# Patient Record
Sex: Female | Born: 1985 | Race: White | Hispanic: No | Marital: Married | Smoking: Current every day smoker
Health system: Southern US, Community
[De-identification: ages and names within clinical notes are randomized; demographics above are authoritative.]

## PROBLEM LIST (undated history)

## (undated) DIAGNOSIS — K5909 Other constipation: Secondary | ICD-10-CM

## (undated) DIAGNOSIS — I499 Cardiac arrhythmia, unspecified: Secondary | ICD-10-CM

## (undated) DIAGNOSIS — M868X9 Other osteomyelitis, unspecified sites: Secondary | ICD-10-CM

## (undated) DIAGNOSIS — G8929 Other chronic pain: Secondary | ICD-10-CM

## (undated) DIAGNOSIS — K589 Irritable bowel syndrome without diarrhea: Secondary | ICD-10-CM

## (undated) DIAGNOSIS — M869 Osteomyelitis, unspecified: Secondary | ICD-10-CM

## (undated) DIAGNOSIS — G894 Chronic pain syndrome: Secondary | ICD-10-CM

## (undated) DIAGNOSIS — R102 Pelvic and perineal pain unspecified side: Secondary | ICD-10-CM

## (undated) HISTORY — PX: TUBAL LIGATION: SHX77

## (undated) HISTORY — PX: TONSILLECTOMY: SUR1361

## (undated) HISTORY — PX: PELVIC FRACTURE SURGERY: SHX119

## (undated) HISTORY — PX: OVARIAN CYST REMOVAL: SHX89

## (undated) HISTORY — PX: FRACTURE SURGERY: SHX138

---
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
## 2005-02-05 ENCOUNTER — Encounter (HOSPITAL_COMMUNITY): Admission: RE | Admit: 2005-02-05 | Discharge: 2005-03-07 | Payer: Self-pay

## 2005-02-17 ENCOUNTER — Emergency Department (HOSPITAL_COMMUNITY): Admission: EM | Admit: 2005-02-17 | Discharge: 2005-02-18 | Payer: Self-pay | Admitting: Emergency Medicine

## 2005-06-18 ENCOUNTER — Ambulatory Visit (HOSPITAL_COMMUNITY): Admission: RE | Admit: 2005-06-18 | Discharge: 2005-06-18 | Payer: Self-pay | Admitting: Family Medicine

## 2005-07-31 ENCOUNTER — Emergency Department (HOSPITAL_COMMUNITY): Admission: EM | Admit: 2005-07-31 | Discharge: 2005-07-31 | Payer: Self-pay | Admitting: Emergency Medicine

## 2005-10-29 ENCOUNTER — Emergency Department (HOSPITAL_COMMUNITY): Admission: EM | Admit: 2005-10-29 | Discharge: 2005-10-30 | Payer: Self-pay | Admitting: Emergency Medicine

## 2005-12-11 ENCOUNTER — Ambulatory Visit (HOSPITAL_BASED_OUTPATIENT_CLINIC_OR_DEPARTMENT_OTHER): Admission: RE | Admit: 2005-12-11 | Discharge: 2005-12-12 | Payer: Self-pay | Admitting: Otolaryngology

## 2005-12-11 ENCOUNTER — Encounter (INDEPENDENT_AMBULATORY_CARE_PROVIDER_SITE_OTHER): Payer: Self-pay | Admitting: *Deleted

## 2006-01-05 ENCOUNTER — Emergency Department (HOSPITAL_COMMUNITY): Admission: EM | Admit: 2006-01-05 | Discharge: 2006-01-05 | Payer: Self-pay | Admitting: Emergency Medicine

## 2007-02-15 ENCOUNTER — Emergency Department (HOSPITAL_COMMUNITY): Admission: EM | Admit: 2007-02-15 | Discharge: 2007-02-15 | Payer: Self-pay | Admitting: Family Medicine

## 2007-05-13 ENCOUNTER — Emergency Department (HOSPITAL_COMMUNITY): Admission: EM | Admit: 2007-05-13 | Discharge: 2007-05-14 | Payer: Self-pay | Admitting: Emergency Medicine

## 2007-08-23 ENCOUNTER — Ambulatory Visit (HOSPITAL_COMMUNITY): Admission: RE | Admit: 2007-08-23 | Discharge: 2007-08-23 | Payer: Self-pay | Admitting: Family Medicine

## 2007-08-31 ENCOUNTER — Emergency Department (HOSPITAL_COMMUNITY): Admission: EM | Admit: 2007-08-31 | Discharge: 2007-09-01 | Payer: Self-pay | Admitting: Emergency Medicine

## 2007-11-16 ENCOUNTER — Emergency Department (HOSPITAL_COMMUNITY): Admission: EM | Admit: 2007-11-16 | Discharge: 2007-11-16 | Payer: Self-pay | Admitting: Emergency Medicine

## 2008-03-24 ENCOUNTER — Emergency Department (HOSPITAL_COMMUNITY): Admission: EM | Admit: 2008-03-24 | Discharge: 2008-03-24 | Payer: Self-pay | Admitting: Emergency Medicine

## 2008-04-05 ENCOUNTER — Ambulatory Visit: Payer: Self-pay | Admitting: Internal Medicine

## 2008-04-05 DIAGNOSIS — M1990 Unspecified osteoarthritis, unspecified site: Secondary | ICD-10-CM | POA: Insufficient documentation

## 2008-04-05 DIAGNOSIS — M545 Low back pain, unspecified: Secondary | ICD-10-CM | POA: Insufficient documentation

## 2008-07-09 ENCOUNTER — Emergency Department (HOSPITAL_COMMUNITY): Admission: EM | Admit: 2008-07-09 | Discharge: 2008-07-10 | Payer: Self-pay | Admitting: Emergency Medicine

## 2008-08-07 ENCOUNTER — Emergency Department (HOSPITAL_COMMUNITY): Admission: EM | Admit: 2008-08-07 | Discharge: 2008-08-08 | Payer: Self-pay | Admitting: Emergency Medicine

## 2009-02-05 ENCOUNTER — Emergency Department (HOSPITAL_COMMUNITY): Admission: EM | Admit: 2009-02-05 | Discharge: 2009-02-05 | Payer: Self-pay | Admitting: Emergency Medicine

## 2009-09-20 ENCOUNTER — Emergency Department (HOSPITAL_COMMUNITY): Admission: EM | Admit: 2009-09-20 | Discharge: 2009-09-20 | Payer: Self-pay | Admitting: Emergency Medicine

## 2009-12-23 ENCOUNTER — Observation Stay (HOSPITAL_COMMUNITY): Admission: EM | Admit: 2009-12-23 | Discharge: 2009-12-23 | Payer: Self-pay | Admitting: Emergency Medicine

## 2010-04-19 ENCOUNTER — Emergency Department (HOSPITAL_COMMUNITY): Admission: EM | Admit: 2010-04-19 | Discharge: 2010-04-19 | Payer: Self-pay | Admitting: Emergency Medicine

## 2010-08-24 ENCOUNTER — Encounter: Payer: Self-pay | Admitting: Orthopedic Surgery

## 2010-11-10 LAB — POCT PREGNANCY, URINE: Preg Test, Ur: NEGATIVE

## 2010-12-03 ENCOUNTER — Emergency Department (HOSPITAL_COMMUNITY)
Admission: EM | Admit: 2010-12-03 | Discharge: 2010-12-03 | Disposition: A | Discharge status: Home / Self Care | Payer: Medicaid Other | Attending: Emergency Medicine | Admitting: Emergency Medicine

## 2010-12-03 ENCOUNTER — Emergency Department (HOSPITAL_COMMUNITY)
Admission: EM | Admit: 2010-12-03 | Discharge: 2010-12-03 | Disposition: A | Discharge status: Home / Self Care | Payer: Medicaid Other | Source: Home / Self Care | Attending: Emergency Medicine | Admitting: Emergency Medicine

## 2010-12-03 DIAGNOSIS — F988 Other specified behavioral and emotional disorders with onset usually occurring in childhood and adolescence: Secondary | ICD-10-CM | POA: Insufficient documentation

## 2010-12-03 DIAGNOSIS — N949 Unspecified condition associated with female genital organs and menstrual cycle: Secondary | ICD-10-CM | POA: Insufficient documentation

## 2010-12-03 DIAGNOSIS — G8929 Other chronic pain: Secondary | ICD-10-CM | POA: Insufficient documentation

## 2010-12-03 DIAGNOSIS — Z760 Encounter for issue of repeat prescription: Secondary | ICD-10-CM | POA: Insufficient documentation

## 2010-12-03 DIAGNOSIS — Z79899 Other long term (current) drug therapy: Secondary | ICD-10-CM | POA: Insufficient documentation

## 2010-12-20 NOTE — Op Note (Signed)
NAMEJAZARI, OBER              ACCOUNT NO.:  [id number]   MEDICAL RECORD NO.:  [id number]          PATIENT TYPE:  AMB   LOCATION:  DSC                          FACILITY:  [facility]   PHYSICIAN:  Suzanna Obey, M.D.       DATE OF BIRTH:  [birth year]   DATE OF PROCEDURE:  12/11/2005  DATE OF DISCHARGE:                                 OPERATIVE REPORT   PREOPERATIVE DIAGNOSIS:  Chronic tonsillitis.   POSTOPERATIVE DIAGNOSIS:  Chronic tonsillitis.   PROCEDURE:  Tonsillectomy.   ANESTHESIA:  General.   ESTIMATED BLOOD LOSS:  Less than 5 cc.   INDICATIONS FOR PROCEDURE:  This is a 25 year old who has had repetitive  tonsillitis episodes that have been refractory to medical therapy.  She is  at the point now that she is ready to proceed with the tonsillectomy.  She  was informed of the risks and benefits of the procedure, including bleeding,  infection, velopharyngeal insufficiency, change in the voice, chronic pain,  and risks of the anesthetic.  All questions were answered, and consent was  obtained.   DESCRIPTION OF PROCEDURE:  The patient was taken to the operating room and  placed in the supine position.  After adequate general endotracheal tube  anesthesia, she was placed in the Rose position and draped in the usual  sterile manner.  The Crowe-Davis mouthgag was inserted, retracted, and  suspended from the Mayo stand.   The left tonsil was begun by making a left anterior tonsillar pillar  incision, identifying the capsule of tonsil and removing it with  electrocautery dissection.  The right tonsil was removed in the same  fashion.  The Crowe-Davis was released __________ suspenders.  Hemostasis  present in all locations with the suction cautery.  The hypopharynx,  esophagus, and stomach were suctioned with the NG tube.   The patient was awakened and brought to the recovery room in stable  condition.  Counts were correct.           ______________________________  Suzanna Obey,  M.D.     JB/MEDQ  D:  12/11/2005  T:  12/12/2005  Job:  [id number]   cc:   Kirk Ruths, M.D.  Fax: [phone]
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
## 2011-01-10 ENCOUNTER — Emergency Department (HOSPITAL_COMMUNITY)
Admission: EM | Admit: 2011-01-10 | Discharge: 2011-01-10 | Disposition: A | Discharge status: Home / Self Care | Payer: Medicaid Other | Attending: Emergency Medicine | Admitting: Emergency Medicine

## 2011-01-10 DIAGNOSIS — R21 Rash and other nonspecific skin eruption: Secondary | ICD-10-CM | POA: Insufficient documentation

## 2011-01-10 LAB — DIFFERENTIAL
Basophils Absolute: 0 10*3/uL (ref 0.0–0.1)
Basophils Relative: 0 % (ref 0–1)
Eosinophils Absolute: 0.5 10*3/uL (ref 0.0–0.7)
Eosinophils Relative: 4 % (ref 0–5)
Lymphocytes Relative: 36 % (ref 12–46)
Lymphs Abs: 4.6 10*3/uL — ABNORMAL HIGH (ref 0.7–4.0)
Monocytes Absolute: 1 10*3/uL (ref 0.1–1.0)
Monocytes Relative: 8 % (ref 3–12)
Neutro Abs: 6.8 10*3/uL (ref 1.7–7.7)
Neutrophils Relative %: 53 % (ref 43–77)

## 2011-01-10 LAB — BASIC METABOLIC PANEL
BUN: 13 mg/dL (ref 6–23)
CO2: 29 mEq/L (ref 19–32)
Calcium: 9.7 mg/dL (ref 8.4–10.5)
Chloride: 100 mEq/L (ref 96–112)
Creatinine, Ser: 0.75 mg/dL (ref 0.4–1.2)
GFR calc Af Amer: >60 mL/min (ref >60)
GFR calc non Af Amer: >60 mL/min (ref >60)
Glucose, Bld: 98 mg/dL (ref 70–99)
Potassium: 3.7 mEq/L (ref 3.5–5.1)
Sodium: 135 mEq/L (ref 135–145)

## 2011-01-10 LAB — CBC
HCT: 34.4 % — ABNORMAL LOW (ref 36.0–46.0)
Hemoglobin: 11.7 g/dL — ABNORMAL LOW (ref 12.0–15.0)
MCH: 31.1 pg (ref 26.0–34.0)
Platelets: 220 10*3/uL (ref 150–400)
RBC: 3.76 MIL/uL — ABNORMAL LOW (ref 3.87–5.11)
RDW: 12.7 % (ref 11.5–15.5)
WBC: 12.9 10*3/uL — ABNORMAL HIGH (ref 4.0–10.5)

## 2011-01-11 LAB — SEDIMENTATION RATE: Sed Rate: 12 mm/hr (ref 0–22)

## 2012-01-28 ENCOUNTER — Encounter (HOSPITAL_COMMUNITY): Payer: Self-pay | Admitting: *Deleted

## 2012-01-28 ENCOUNTER — Emergency Department (HOSPITAL_COMMUNITY)
Admission: EM | Admit: 2012-01-28 | Discharge: 2012-01-28 | Disposition: A | Discharge status: Home / Self Care | Payer: Medicaid Other | Attending: Emergency Medicine | Admitting: Emergency Medicine

## 2012-01-28 DIAGNOSIS — L02211 Cutaneous abscess of abdominal wall: Secondary | ICD-10-CM

## 2012-01-28 DIAGNOSIS — Z880 Allergy status to penicillin: Secondary | ICD-10-CM | POA: Insufficient documentation

## 2012-01-28 DIAGNOSIS — L02219 Cutaneous abscess of trunk, unspecified: Secondary | ICD-10-CM | POA: Insufficient documentation

## 2012-01-28 DIAGNOSIS — F17200 Nicotine dependence, unspecified, uncomplicated: Secondary | ICD-10-CM | POA: Insufficient documentation

## 2012-01-28 HISTORY — DX: Cardiac arrhythmia, unspecified: I49.9

## 2012-01-28 MED ORDER — VANCOMYCIN HCL IN DEXTROSE 1-5 GM/200ML-% IV SOLN
1000.0000 mg | Freq: Once | INTRAVENOUS | Status: AC
  Administered 2012-01-28: 1000 mg via INTRAVENOUS
  Filled 2012-01-28: qty 200

## 2012-01-28 MED ORDER — ONDANSETRON HCL 4 MG/2ML IJ SOLN
4.0000 mg | Freq: Once | INTRAMUSCULAR | Status: AC
  Administered 2012-01-28: 4 mg via INTRAVENOUS
  Filled 2012-01-28: qty 2

## 2012-01-28 MED ORDER — DIPHENHYDRAMINE HCL 50 MG/ML IJ SOLN
25.0000 mg | Freq: Once | INTRAMUSCULAR | Status: AC
  Administered 2012-01-28: 25 mg via INTRAVENOUS
  Filled 2012-01-28: qty 1

## 2012-01-28 MED ORDER — SULFAMETHOXAZOLE-TRIMETHOPRIM 800-160 MG PO TABS: 1.0000 | ORAL_TABLET | Freq: Two times a day (BID) | ORAL | Status: AC

## 2012-01-28 MED ORDER — HYDROMORPHONE HCL PF 1 MG/ML IJ SOLN
1.0000 mg | Freq: Once | INTRAMUSCULAR | Status: AC
  Administered 2012-01-28: 1 mg via INTRAVENOUS
  Filled 2012-01-28: qty 1

## 2012-01-28 NOTE — Discharge Instructions (Signed)
Abscess An abscess (boil or furuncle) is an infected area under your skin. This area is filled with yellowish white fluid (pus). HOME CARE   Only take medicine as told by your doctor.   Keep the skin clean around your abscess. Keep clothes that may touch the abscess clean.   Change any bandages (dressings) as told by your doctor.   Avoid direct skin contact with other people. The infection can spread by skin contact with others.   Practice good hygiene and do not share personal care items.   Do not share athletic equipment, towels, or whirlpools. Shower after every practice or work out session.   If a draining area cannot be covered:   Do not play sports.   Children should not go to daycare until the wound has healed or until fluid (drainage) stops coming out of the wound.   See your doctor for a follow-up visit as told.  GET HELP RIGHT AWAY IF:   There is more pain, puffiness (swelling), and redness in the wound site.   There is fluid or bleeding from the wound site.   You have muscle aches, chills, fever, or feel sick.   You or your child has a temperature by mouth above 102 F (38.9 C), not controlled by medicine.   Your baby is older than 3 months with a rectal temperature of 102 F (38.9 C) or higher.  MAKE SURE YOU:   Understand these instructions.   Will watch your condition.   Will get help right away if you are not doing well or get worse.  Document Released: 01/07/2008 Document Revised: 07/10/2011 Document Reviewed: 01/07/2008 [facility] Patient Information 2012 [place], [place].  Keep wound clean. Antibiotic for 10 days. Phone number for general surgeon given. Leakage of pus is a good thing.
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
## 2012-01-28 NOTE — ED Notes (Signed)
Patient states "The benadryl has helped some, but not much."
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
## 2012-01-28 NOTE — ED Notes (Signed)
Patient complaining of generalized itching. Advised Dr Adriana Simas. Verbal order for Benadryl 25mg  IV once.

## 2012-03-07 ENCOUNTER — Emergency Department (HOSPITAL_COMMUNITY)
Admission: EM | Admit: 2012-03-07 | Discharge: 2012-03-07 | Disposition: A | Discharge status: Home / Self Care | Payer: Medicaid Other | Attending: Emergency Medicine | Admitting: Emergency Medicine

## 2012-03-07 ENCOUNTER — Encounter (HOSPITAL_COMMUNITY): Payer: Self-pay | Admitting: Emergency Medicine

## 2012-03-07 ENCOUNTER — Emergency Department (HOSPITAL_COMMUNITY): Payer: Medicaid Other

## 2012-03-07 DIAGNOSIS — S5002XA Contusion of left elbow, initial encounter: Secondary | ICD-10-CM

## 2012-03-07 DIAGNOSIS — S5000XA Contusion of unspecified elbow, initial encounter: Secondary | ICD-10-CM | POA: Insufficient documentation

## 2012-03-07 DIAGNOSIS — F17200 Nicotine dependence, unspecified, uncomplicated: Secondary | ICD-10-CM | POA: Insufficient documentation

## 2012-03-07 DIAGNOSIS — I499 Cardiac arrhythmia, unspecified: Secondary | ICD-10-CM | POA: Insufficient documentation

## 2012-03-07 DIAGNOSIS — Z79899 Other long term (current) drug therapy: Secondary | ICD-10-CM | POA: Insufficient documentation

## 2012-03-07 DIAGNOSIS — Z91013 Allergy to seafood: Secondary | ICD-10-CM | POA: Insufficient documentation

## 2012-03-07 DIAGNOSIS — Y9239 Other specified sports and athletic area as the place of occurrence of the external cause: Secondary | ICD-10-CM | POA: Insufficient documentation

## 2012-03-07 DIAGNOSIS — IMO0002 Reserved for concepts with insufficient information to code with codable children: Secondary | ICD-10-CM | POA: Insufficient documentation

## 2012-03-07 DIAGNOSIS — Z91038 Other insect allergy status: Secondary | ICD-10-CM | POA: Insufficient documentation

## 2012-03-07 DIAGNOSIS — Z880 Allergy status to penicillin: Secondary | ICD-10-CM | POA: Insufficient documentation

## 2012-03-07 MED ORDER — HYDROCODONE-ACETAMINOPHEN 5-325 MG PO TABS
1.0000 | ORAL_TABLET | Freq: Once | ORAL | Status: AC
  Administered 2012-03-07: 1 via ORAL
  Filled 2012-03-07: qty 1

## 2012-03-07 MED ORDER — HYDROCODONE-ACETAMINOPHEN 5-325 MG PO TABS: 1.0000 | ORAL_TABLET | ORAL | Status: AC | PRN

## 2012-03-07 MED ORDER — LIDOCAINE HCL (PF) 1 % IJ SOLN
INTRAMUSCULAR | Status: AC
  Filled 2012-03-07: qty 5

## 2012-03-07 NOTE — ED Notes (Signed)
Pt has swelling and pain to her left elbow since going down a water slide yesterday. Pt able to bend arm but unable to straighten it out. Pt alert and oriented x 3. Skin warm and dry. Color pink. Bruising noted to left elbow.

## 2012-03-07 NOTE — ED Provider Notes (Signed)
Medical screening examination/treatment/procedure(s) were performed by non-physician practitioner and as supervising physician I was immediately available for consultation/collaboration.   Lyanne Co, MD 03/07/12 2016
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
## 2012-03-07 NOTE — ED Notes (Signed)
Pt on slide and hit L elbow. States pain getting gworse and less mobile. Pt is guarding area. Slight swelling and bruising noted.

## 2013-04-04 ENCOUNTER — Emergency Department (HOSPITAL_COMMUNITY): Payer: Medicaid Other

## 2013-04-04 ENCOUNTER — Emergency Department (HOSPITAL_COMMUNITY)
Admission: EM | Admit: 2013-04-04 | Discharge: 2013-04-04 | Disposition: A | Discharge status: Home / Self Care | Payer: Medicaid Other | Attending: Emergency Medicine | Admitting: Emergency Medicine

## 2013-04-04 DIAGNOSIS — F17200 Nicotine dependence, unspecified, uncomplicated: Secondary | ICD-10-CM | POA: Insufficient documentation

## 2013-04-04 DIAGNOSIS — Z880 Allergy status to penicillin: Secondary | ICD-10-CM | POA: Insufficient documentation

## 2013-04-04 DIAGNOSIS — Y929 Unspecified place or not applicable: Secondary | ICD-10-CM | POA: Insufficient documentation

## 2013-04-04 DIAGNOSIS — Z881 Allergy status to other antibiotic agents status: Secondary | ICD-10-CM | POA: Insufficient documentation

## 2013-04-04 DIAGNOSIS — I499 Cardiac arrhythmia, unspecified: Secondary | ICD-10-CM | POA: Insufficient documentation

## 2013-04-04 DIAGNOSIS — M25522 Pain in left elbow: Secondary | ICD-10-CM

## 2013-04-04 DIAGNOSIS — IMO0002 Reserved for concepts with insufficient information to code with codable children: Secondary | ICD-10-CM | POA: Insufficient documentation

## 2013-04-04 DIAGNOSIS — M25529 Pain in unspecified elbow: Secondary | ICD-10-CM | POA: Insufficient documentation

## 2013-04-04 DIAGNOSIS — Z79899 Other long term (current) drug therapy: Secondary | ICD-10-CM | POA: Insufficient documentation

## 2013-04-04 DIAGNOSIS — Y939 Activity, unspecified: Secondary | ICD-10-CM | POA: Insufficient documentation

## 2013-04-04 MED ORDER — OXYCODONE-ACETAMINOPHEN 5-325 MG PO TABS
1.0000 | ORAL_TABLET | Freq: Once | ORAL | Status: AC
  Administered 2013-04-04: 1 via ORAL
  Filled 2013-04-04 (×2): qty 1

## 2013-04-04 MED ORDER — IBUPROFEN 200 MG PO TABS
400.0000 mg | ORAL_TABLET | Freq: Once | ORAL | Status: AC
  Administered 2013-04-04: 400 mg via ORAL
  Filled 2013-04-04: qty 2
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
## 2013-04-04 NOTE — ED Provider Notes (Signed)
Medical screening examination/treatment/procedure(s) were performed by non-physician practitioner and as supervising physician I was immediately available for consultation/collaboration. Jarielys Girardot, MD, FACEP   Jalecia Leon L Abhinav Mayorquin, MD 04/04/13 2301 

## 2013-10-07 ENCOUNTER — Encounter (HOSPITAL_COMMUNITY): Payer: Self-pay | Admitting: Emergency Medicine

## 2013-10-07 DIAGNOSIS — Z79899 Other long term (current) drug therapy: Secondary | ICD-10-CM | POA: Insufficient documentation

## 2013-10-07 DIAGNOSIS — N83209 Unspecified ovarian cyst, unspecified side: Secondary | ICD-10-CM | POA: Insufficient documentation

## 2013-10-07 DIAGNOSIS — F17200 Nicotine dependence, unspecified, uncomplicated: Secondary | ICD-10-CM | POA: Insufficient documentation

## 2013-10-07 DIAGNOSIS — Z791 Long term (current) use of non-steroidal anti-inflammatories (NSAID): Secondary | ICD-10-CM | POA: Insufficient documentation

## 2013-10-07 DIAGNOSIS — Z880 Allergy status to penicillin: Secondary | ICD-10-CM | POA: Insufficient documentation

## 2013-10-07 DIAGNOSIS — Z8679 Personal history of other diseases of the circulatory system: Secondary | ICD-10-CM | POA: Insufficient documentation

## 2013-10-07 DIAGNOSIS — K8020 Calculus of gallbladder without cholecystitis without obstruction: Secondary | ICD-10-CM | POA: Insufficient documentation

## 2013-10-07 DIAGNOSIS — Z3202 Encounter for pregnancy test, result negative: Secondary | ICD-10-CM | POA: Insufficient documentation

## 2013-10-07 NOTE — ED Notes (Signed)
Patient c/o LLQ pain since 0100 with nausea.

## 2013-10-08 ENCOUNTER — Emergency Department (HOSPITAL_COMMUNITY): Payer: Medicaid Other

## 2013-10-08 ENCOUNTER — Emergency Department (HOSPITAL_COMMUNITY)
Admission: EM | Admit: 2013-10-08 | Discharge: 2013-10-08 | Disposition: A | Discharge status: Home / Self Care | Payer: Medicaid Other | Attending: Emergency Medicine | Admitting: Emergency Medicine

## 2013-10-08 DIAGNOSIS — N83209 Unspecified ovarian cyst, unspecified side: Secondary | ICD-10-CM

## 2013-10-08 DIAGNOSIS — K8020 Calculus of gallbladder without cholecystitis without obstruction: Secondary | ICD-10-CM

## 2013-10-08 LAB — BASIC METABOLIC PANEL
BUN: 22 mg/dL (ref 6–23)
CALCIUM: 9.1 mg/dL (ref 8.4–10.5)
CO2: 25 meq/L (ref 19–32)
Chloride: 103 mEq/L (ref 96–112)
Creatinine, Ser: 0.66 mg/dL (ref 0.50–1.10)
GFR calc Af Amer: >90 mL/min (ref >90)
Glucose, Bld: 77 mg/dL (ref 70–99)
POTASSIUM: 3.6 meq/L — AB (ref 3.7–5.3)
SODIUM: 139 meq/L (ref 137–147)

## 2013-10-08 LAB — CBC WITH DIFFERENTIAL/PLATELET
Basophils Absolute: 0 10*3/uL (ref 0.0–0.1)
Basophils Relative: 0 % (ref 0–1)
Eosinophils Absolute: 0.1 10*3/uL (ref 0.0–0.7)
Eosinophils Relative: 0 % (ref 0–5)
HCT: 35.5 % — ABNORMAL LOW (ref 36.0–46.0)
Hemoglobin: 12.1 g/dL (ref 12.0–15.0)
LYMPHS ABS: 3.3 10*3/uL (ref 0.7–4.0)
LYMPHS PCT: 24 % (ref 12–46)
MCH: 31.4 pg (ref 26.0–34.0)
MCHC: 34.1 g/dL (ref 30.0–36.0)
MCV: 92.2 fL (ref 78.0–100.0)
Monocytes Absolute: 0.8 10*3/uL (ref 0.1–1.0)
Monocytes Relative: 5 % (ref 3–12)
NEUTROS PCT: 71 % (ref 43–77)
Neutro Abs: 9.9 10*3/uL — ABNORMAL HIGH (ref 1.7–7.7)
PLATELETS: 256 10*3/uL (ref 150–400)
RBC: 3.85 MIL/uL — AB (ref 3.87–5.11)
RDW: 13.2 % (ref 11.5–15.5)
WBC: 14.1 10*3/uL — AB (ref 4.0–10.5)

## 2013-10-08 LAB — URINALYSIS, ROUTINE W REFLEX MICROSCOPIC
Bilirubin Urine: NEGATIVE
GLUCOSE, UA: NEGATIVE mg/dL
Hgb urine dipstick: NEGATIVE
Ketones, ur: NEGATIVE mg/dL
LEUKOCYTES UA: NEGATIVE
Nitrite: NEGATIVE
PROTEIN: NEGATIVE mg/dL
Urobilinogen, UA: 0.2 mg/dL (ref 0.0–1.0)
pH: 5.5 (ref 5.0–8.0)

## 2013-10-08 LAB — PREGNANCY, URINE: Preg Test, Ur: NEGATIVE

## 2013-10-08 MED ORDER — IOHEXOL 300 MG/ML  SOLN
50.0000 mL | Freq: Once | INTRAMUSCULAR | Status: AC | PRN
  Administered 2013-10-08: 50 mL via ORAL

## 2013-10-08 MED ORDER — MORPHINE SULFATE 2 MG/ML IJ SOLN
INTRAMUSCULAR | Status: AC
  Administered 2013-10-08: 4 mg via INTRAVENOUS
  Filled 2013-10-08: qty 2

## 2013-10-08 MED ORDER — MORPHINE SULFATE 4 MG/ML IJ SOLN: 4.0000 mg | Freq: Once | INTRAMUSCULAR | Status: DC

## 2013-10-08 MED ORDER — NAPROXEN 500 MG PO TABS: 500.0000 mg | ORAL_TABLET | Freq: Two times a day (BID) | ORAL | Status: DC

## 2013-10-08 MED ORDER — OXYCODONE-ACETAMINOPHEN 5-325 MG PO TABS
2.0000 | ORAL_TABLET | Freq: Once | ORAL | Status: AC
  Administered 2013-10-08: 2 via ORAL
  Filled 2013-10-08: qty 2

## 2013-10-08 MED ORDER — ONDANSETRON 4 MG PO TBDP: 4.0000 mg | ORAL_TABLET | Freq: Three times a day (TID) | ORAL | Status: DC | PRN

## 2013-10-08 MED ORDER — HYDROCODONE-ACETAMINOPHEN 5-325 MG PO TABS: 2.0000 | ORAL_TABLET | ORAL | Status: DC | PRN

## 2013-10-08 MED ORDER — IOHEXOL 300 MG/ML  SOLN
100.0000 mL | Freq: Once | INTRAMUSCULAR | Status: AC | PRN
  Administered 2013-10-08: 100 mL via INTRAVENOUS

## 2013-10-08 MED ORDER — ONDANSETRON HCL 4 MG/2ML IJ SOLN
4.0000 mg | Freq: Once | INTRAMUSCULAR | Status: AC
  Administered 2013-10-08: 4 mg via INTRAVENOUS
  Filled 2013-10-08: qty 2

## 2013-10-08 MED ORDER — SODIUM CHLORIDE 0.9 % IV SOLN
Freq: Once | INTRAVENOUS | Status: AC
  Administered 2013-10-08: 75 mL/h via INTRAVENOUS

## 2013-10-08 NOTE — ED Notes (Signed)
Pt ambulatory to the bathroom with assistance.

## 2013-10-08 NOTE — ED Provider Notes (Signed)
CSN: [id number]     Arrival date & time 10/07/13  2325 History   First MD Initiated Contact with Patient 10/08/13 [phone]     Chief Complaint  Patient presents with  . Abdominal Pain     (Consider location/radiation/quality/duration/timing/severity/associated sxs/prior Treatment) HPI Comments: 28 year old female with no past surgical history of the abdomen who presents with a complaint of left lower quadrant pain which was gradual in onset yesterday, has been persistent and has been gradually worsening over the last 12 hours. The pain is localized in the left pelvis in the suprapubic region but there is no complaints of urinary problems including dysuria, hematuria, frequency, diarrhea, rectal bleeding or constipation. She had some nausea but took Phenergan prior to arrival. This pain is moderate to severe, not similar to any prior pain she has had in the past. She has never had a kidney stone, never had an ovarian pathology and has never had appendicitis or diverticulitis. This pain does not radiate to the back. She denies vaginal discharge or bleeding  Patient is a 28 y.o. female presenting with abdominal pain. The history is provided by the patient and the spouse.  Abdominal Pain   Past Medical History  Diagnosis Date  . Arrhythmia    Past Surgical History  Procedure Laterality Date  . Cesarean section    . Pelvic fracture surgery    . Tonsillectomy     No family history on file. History  Substance Use Topics  . Smoking status: Current Every Day Smoker  . Smokeless tobacco: Not on file  . Alcohol Use: No   OB History   Grav Para Term Preterm Abortions TAB SAB Ect Mult Living                 Review of Systems  Gastrointestinal: Positive for abdominal pain.  All other systems reviewed and are negative.      Allergies  Bee venom; Shellfish allergy; Other; Amoxicillin; and Penicillins  Home Medications   Current Outpatient Rx  Name  Route  Sig  Dispense  Refill  .  Buprenorphine HCl-Naloxone HCl (SUBOXONE) 8-2 MG FILM   Sublingual   Place 1 each under the tongue 2 (two) times daily.         . diclofenac sodium (VOLTAREN) 1 % GEL   Topical   Apply 1 application topically daily as needed. For pain         . EPINEPHrine (EPIPEN) 0.3 mg/0.3 mL DEVI   Intramuscular   Inject 0.3 mg into the muscle once.         Marland Kitchen ibuprofen (ADVIL,MOTRIN) 200 MG tablet   Oral   Take 400 mg by mouth every 6 (six) hours as needed for pain.         Marland Kitchen zolpidem (AMBIEN) 10 MG tablet   Oral   Take 10 mg by mouth at bedtime.         Marland Kitchen HYDROcodone-acetaminophen (NORCO/VICODIN) 5-325 MG per tablet   Oral   Take 2 tablets by mouth every 4 (four) hours as needed.   10 tablet   0   . naproxen (NAPROSYN) 500 MG tablet   Oral   Take 1 tablet (500 mg total) by mouth 2 (two) times daily with a meal.   30 tablet   0   . ondansetron (ZOFRAN ODT) 4 MG disintegrating tablet   Oral   Take 1 tablet (4 mg total) by mouth every 8 (eight) hours as needed for nausea.   10  tablet   0    BP 111/68  Pulse 68  Temp(Src) 98.2 F (36.8 C) (Oral)  Resp 20  Ht 5\' 5"  (1.651 m)  Wt 135 lb (61.236 kg)  BMI 22.47 kg/m2  SpO2 100%  LMP 09/12/2013 Physical Exam  Nursing note and vitals reviewed. Constitutional: She appears well-developed and well-nourished. No distress.  HENT:  Head: Normocephalic and atraumatic.  Mouth/Throat: Oropharynx is clear and moist. No oropharyngeal exudate.  Eyes: Conjunctivae and EOM are normal. Pupils are equal, round, and reactive to light. Right eye exhibits no discharge. Left eye exhibits no discharge. No scleral icterus.  Neck: Normal range of motion. Neck supple. No JVD present. No thyromegaly present.  Cardiovascular: Normal rate, regular rhythm, normal heart sounds and intact distal pulses.  Exam reveals no gallop and no friction rub.   No murmur heard. Pulmonary/Chest: Effort normal and breath sounds normal. No respiratory distress.  She has no wheezes. She has no rales.  Abdominal: Soft. Bowel sounds are normal. She exhibits no distension and no mass. There is tenderness ( Focal tenderness to palpation in the left lower quadrant and suprapubic area. No other abdominal tenderness).  No guarding, no peritoneal signs  Musculoskeletal: Normal range of motion. She exhibits no edema and no tenderness.  Lymphadenopathy:    She has no cervical adenopathy.  Neurological: She is alert. Coordination normal.  Skin: Skin is warm and dry. No rash noted. No erythema.  Psychiatric: She has a normal mood and affect. Her behavior is normal.    ED Course  Procedures (including critical care time) Labs Review Labs Reviewed  URINALYSIS, ROUTINE W REFLEX MICROSCOPIC - Abnormal; Notable for the following:    Specific Gravity, Urine >1.030 (*)    All other components within normal limits  BASIC METABOLIC PANEL - Abnormal; Notable for the following:    Potassium 3.6 (*)    All other components within normal limits  CBC WITH DIFFERENTIAL - Abnormal; Notable for the following:    WBC 14.1 (*)    RBC 3.85 (*)    HCT 35.5 (*)    Neutro Abs 9.9 (*)    All other components within normal limits  PREGNANCY, URINE   Imaging Review Ct Abdomen Pelvis W Contrast  10/08/2013   CLINICAL DATA:  Left lower quadrant pain.  EXAM: CT ABDOMEN AND PELVIS WITH CONTRAST  TECHNIQUE: Multidetector CT imaging of the abdomen and pelvis was performed using the standard protocol following bolus administration of intravenous contrast.  CONTRAST:  49mL OMNIPAQUE IOHEXOL 300 MG/ML SOLN, 122mL OMNIPAQUE IOHEXOL 300 MG/ML SOLN  COMPARISON:  None.  FINDINGS: BODY WALL: Unremarkable.  LOWER CHEST: Unremarkable.  ABDOMEN/PELVIS:  Liver: No focal abnormality.  Biliary: Numerous gallstones. No biliary dilatation. No evidence of cholecystitis.  Pancreas: Unremarkable.  Spleen: Unremarkable.  Adrenals: Unremarkable.  Kidneys and ureters: No hydronephrosis or stone.  Bladder:  Unremarkable.  Reproductive: 7 cm left ovarian cyst. No evidence of nodule or septation. The neighboring ovarian parenchyma does not appear particularly edematous or enlarged to suggest active torsion.  Bowel: No obstruction. Appendix not visualized. No pericecal inflammation.  Retroperitoneum: No mass or adenopathy.  Peritoneum: No free fluid or gas.  Vascular: No acute abnormality.  OSSEOUS: Status post ORIF of a right pelvic fracture. There is symmetric sclerosis around remote hardware sites, without active inflammation visible. The sclerosis chronic compared to lumbar spine radiography in 2006.  IMPRESSION: 1. 7 cm left ovarian cyst which appears simple. If clinically needed, Doppler could document left ovarian  flow. See recommendations below. 2. Cholelithiasis.  RECOMMENDATIONS: The ovarian cyst is almost certainly benign, but follow up ultrasound is recommended in 1 year according to the Society of Radiologists in [place] (D Clovis Riley et al. Management of Asymptomatic Ovarian and Other Adnexal Cysts Imaged at [place]: Society of Radiologists in [place] Statement 2010. Radiology 256 (Sept 2010): [id number].).   Electronically Signed   By: Jorje Guild M.D.   On: 10/08/2013 06:05     EKG Interpretation None      MDM   Final diagnoses:  Ovarian cyst  Cholelithiasis    The patient has no CVA tenderness, normal vital signs but does have a leukocytosis. At this time I feel it would be pertinent to test the patient for other etiologies of her pain such as diverticulitis, ovarian pathology. I do not feel that her pain is consistent with ovarian torsion. CT scan will be ordered, pain medication nausea medication and fluids ordered.  The ovarian cyst is 7cm in size - there is no other acute findings - pt informed of the gall stones and the cyst - ahs f/u with GYN in 2 days.  Feeling better after meds  Meds given in ED:  Medications  morphine 4  MG/ML injection 4 mg (not administered)  oxyCODONE-acetaminophen (PERCOCET/ROXICET) 5-325 MG per tablet 2 tablet (not administered)  ondansetron (ZOFRAN) injection 4 mg (not administered)  ondansetron (ZOFRAN) injection 4 mg (4 mg Intravenous Given 10/08/13 0335)  0.9 %  sodium chloride infusion (75 mL/hr Intravenous New Bag/Given 10/08/13 0334)  morphine 2 MG/ML injection (4 mg Intravenous Given 10/08/13 0337)  iohexol (OMNIPAQUE) 300 MG/ML solution 50 mL (50 mLs Oral Contrast Given 10/08/13 0451)  iohexol (OMNIPAQUE) 300 MG/ML solution 100 mL (100 mLs Intravenous Contrast Given 10/08/13 0451)    New Prescriptions   HYDROCODONE-ACETAMINOPHEN (NORCO/VICODIN) 5-325 MG PER TABLET    Take 2 tablets by mouth every 4 (four) hours as needed.   NAPROXEN (NAPROSYN) 500 MG TABLET    Take 1 tablet (500 mg total) by mouth 2 (two) times daily with a meal.   ONDANSETRON (ZOFRAN ODT) 4 MG DISINTEGRATING TABLET    Take 1 tablet (4 mg total) by mouth every 8 (eight) hours as needed for nausea.      Johnna Acosta, MD 10/08/13 [phone]

## 2013-10-08 NOTE — Discharge Instructions (Signed)
Please call your doctor for a followup appointment within 24-48 hours. When you talk to your doctor please let them know that you were seen in the emergency department and have them acquire all of your records so that they can discuss the findings with you and formulate a treatment plan to fully care for your new and ongoing problems. ° °

## 2013-10-08 NOTE — ED Notes (Signed)
Discharge instructions and prescriptions given and reviewed with patient.  Patient verbalized understanding of sedating effects of medication and follow up care with GYN on Monday.  Patient ambulatory; discharged home in good condition.

## 2013-10-10 ENCOUNTER — Encounter: Payer: Self-pay | Admitting: Women's Health

## 2013-10-10 ENCOUNTER — Ambulatory Visit (INDEPENDENT_AMBULATORY_CARE_PROVIDER_SITE_OTHER): Payer: Medicaid Other | Admitting: Women's Health

## 2013-10-10 ENCOUNTER — Ambulatory Visit (INDEPENDENT_AMBULATORY_CARE_PROVIDER_SITE_OTHER): Payer: Medicaid Other

## 2013-10-10 ENCOUNTER — Other Ambulatory Visit: Payer: Self-pay | Admitting: Obstetrics and Gynecology

## 2013-10-10 ENCOUNTER — Other Ambulatory Visit (HOSPITAL_COMMUNITY)
Admission: RE | Admit: 2013-10-10 | Discharge: 2013-10-10 | Disposition: A | Discharge status: Home / Self Care | Payer: Medicaid Other | Source: Ambulatory Visit | Attending: Obstetrics & Gynecology | Admitting: Obstetrics & Gynecology

## 2013-10-10 ENCOUNTER — Telehealth: Payer: Self-pay | Admitting: Obstetrics and Gynecology

## 2013-10-10 VITALS — BP 120/60 | Ht 66.0 in | Wt 142.0 lb

## 2013-10-10 DIAGNOSIS — Z0000 Encounter for general adult medical examination without abnormal findings: Secondary | ICD-10-CM

## 2013-10-10 DIAGNOSIS — Z01419 Encounter for gynecological examination (general) (routine) without abnormal findings: Secondary | ICD-10-CM

## 2013-10-10 DIAGNOSIS — N83209 Unspecified ovarian cyst, unspecified side: Secondary | ICD-10-CM

## 2013-10-10 DIAGNOSIS — Z8781 Personal history of (healed) traumatic fracture: Secondary | ICD-10-CM | POA: Insufficient documentation

## 2013-10-10 DIAGNOSIS — R102 Pelvic and perineal pain: Secondary | ICD-10-CM

## 2013-10-10 DIAGNOSIS — Z01818 Encounter for other preprocedural examination: Secondary | ICD-10-CM

## 2013-10-10 DIAGNOSIS — N83202 Unspecified ovarian cyst, left side: Secondary | ICD-10-CM

## 2013-10-10 DIAGNOSIS — F17200 Nicotine dependence, unspecified, uncomplicated: Secondary | ICD-10-CM | POA: Insufficient documentation

## 2013-10-10 MED ORDER — ONDANSETRON 8 MG PO TBDP: 8.0000 mg | ORAL_TABLET | Freq: Three times a day (TID) | ORAL | Status: DC | PRN

## 2013-10-10 MED ORDER — OXYCODONE-ACETAMINOPHEN 5-325 MG PO TABS: 1.0000 | ORAL_TABLET | ORAL | Status: DC | PRN

## 2013-10-10 MED ORDER — KETOROLAC TROMETHAMINE 10 MG PO TABS: 10.0000 mg | ORAL_TABLET | Freq: Four times a day (QID) | ORAL | Status: DC | PRN

## 2013-10-10 NOTE — Telephone Encounter (Signed)
Pt would like to proceed with surgery tomorrow with Dr. Glo Herring, would also like to get tubal ligation. Spoke with Dr. Glo Herring, will review schedule and see if possible to do procedure tomorrow and call pt back.
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
## 2013-10-10 NOTE — Patient Instructions (Addendum)
Ovarian Cyst An ovarian cyst is a fluid-filled sac that forms on an ovary. The ovaries are small organs that produce eggs in women. Various types of cysts can form on the ovaries. Most are not cancerous. Many do not cause problems, and they often go away on their own. Some may cause symptoms and require treatment. Common types of ovarian cysts include:  Functional cysts These cysts may occur every month during the menstrual cycle. This is normal. The cysts usually go away with the next menstrual cycle if the woman does not get pregnant. Usually, there are no symptoms with a functional cyst.  Endometrioma cysts These cysts form from the tissue that lines the uterus. They are also called "chocolate cysts" because they become filled with blood that turns brown. This type of cyst can cause pain in the lower abdomen during intercourse and with your menstrual period.  Cystadenoma cysts This type develops from the cells on the outside of the ovary. These cysts can get very big and cause lower abdomen pain and pain with intercourse. This type of cyst can twist on itself, cut off its blood supply, and cause severe pain. It can also easily rupture and cause a lot of pain.  Dermoid cysts This type of cyst is sometimes found in both ovaries. These cysts may contain different kinds of body tissue, such as skin, teeth, hair, or cartilage. They usually do not cause symptoms unless they get very big.  Theca lutein cysts These cysts occur when too much of a certain hormone (human chorionic gonadotropin) is produced and overstimulates the ovaries to produce an egg. This is most common after procedures used to assist with the conception of a baby (in vitro fertilization). CAUSES   Fertility drugs can cause a condition in which multiple large cysts are formed on the ovaries. This is called ovarian hyperstimulation syndrome.  A condition called polycystic ovary syndrome can cause hormonal imbalances that can lead to  nonfunctional ovarian cysts. SIGNS AND SYMPTOMS  Many ovarian cysts do not cause symptoms. If symptoms are present, they may include:  Pelvic pain or pressure.  Pain in the lower abdomen.  Pain during sexual intercourse.  Increasing girth (swelling) of the abdomen.  Abnormal menstrual periods.  Increasing pain with menstrual periods.  Stopping having menstrual periods without being pregnant. DIAGNOSIS  These cysts are commonly found during a routine or annual pelvic exam. Tests may be ordered to find out more about the cyst. These tests may include:  Ultrasound.  X-ray of the pelvis.  CT scan.  MRI.  Blood tests. TREATMENT  Many ovarian cysts go away on their own without treatment. Your health care provider may want to check your cyst regularly for 2 3 months to see if it changes. For women in menopause, it is particularly important to monitor a cyst closely because of the higher rate of ovarian cancer in menopausal women. When treatment is needed, it may include any of the following:  A procedure to drain the cyst (aspiration). This may be done using a long needle and ultrasound. It can also be done through a laparoscopic procedure. This involves using a thin, lighted tube with a tiny camera on the end (laparoscope) inserted through a small incision.  Surgery to remove the whole cyst. This may be done using laparoscopic surgery or an open surgery involving a larger incision in the lower abdomen.  Hormone treatment or birth control pills. These methods are sometimes used to help dissolve a cyst. HOME CARE  INSTRUCTIONS   Only take over-the-counter or prescription medicines as directed by your health care provider.  Follow up with your health care provider as directed.  Get regular pelvic exams and Pap tests. SEEK MEDICAL CARE IF:   Your periods are late, irregular, or painful, or they stop.  Your pelvic pain or abdominal pain does not go away.  Your abdomen becomes  larger or swollen.  You have pressure on your bladder or trouble emptying your bladder completely.  You have pain during sexual intercourse.  You have feelings of fullness, pressure, or discomfort in your stomach.  You lose weight for no apparent reason.  You feel generally ill.  You become constipated.  You lose your appetite.  You develop acne.  You have an increase in body and facial hair.  You are gaining weight, without changing your exercise and eating habits.  You think you are pregnant. SEEK IMMEDIATE MEDICAL CARE IF:   You have increasing abdominal pain.  You feel sick to your stomach (nauseous), and you throw up (vomit).  You develop a fever that comes on suddenly.  You have abdominal pain during a bowel movement.  Your menstrual periods become heavier than usual. Document Released: 07/21/2005 Document Revised: 05/11/2013 Document Reviewed: 03/28/2013 [facility] Patient Information 2014 [place].  Etonogestrel implant- Nexplanon What is this medicine? ETONOGESTREL (et oh noe JES trel) is a contraceptive (birth control) device. It is used to prevent pregnancy. It can be used for up to 3 years. This medicine may be used for other purposes; ask your health care provider or pharmacist if you have questions. COMMON BRAND NAME(S): Implanon, Nexplanon  What should I tell my health care provider before I take this medicine? They need to know if you have any of these conditions: -abnormal vaginal bleeding -blood vessel disease or blood clots -cancer of the breast, cervix, or liver -depression -diabetes -gallbladder disease -headaches -heart disease or recent heart attack -high blood pressure -high cholesterol -kidney disease -liver disease -renal disease -seizures -tobacco smoker -an unusual or allergic reaction to etonogestrel, other hormones, anesthetics or antiseptics, medicines, foods, dyes, or preservatives -pregnant or trying to get  pregnant -breast-feeding How should I use this medicine? This device is inserted just under the skin on the inner side of your upper arm by a health care professional. Talk to your pediatrician regarding the use of this medicine in children. Special care may be needed. Overdosage: If you think you've taken too much of this medicine contact a poison control center or emergency room at once. Overdosage: If you think you have taken too much of this medicine contact a poison control center or emergency room at once. NOTE: This medicine is only for you. Do not share this medicine with others. What if I miss a dose? This does not apply. What may interact with this medicine? Do not take this medicine with any of the following medications: -amprenavir -bosentan -fosamprenavir This medicine may also interact with the following medications: -barbiturate medicines for inducing sleep or treating seizures -certain medicines for fungal infections like ketoconazole and itraconazole -griseofulvin -medicines to treat seizures like carbamazepine, felbamate, oxcarbazepine, phenytoin, topiramate -modafinil -phenylbutazone -rifampin -some medicines to treat HIV infection like atazanavir, indinavir, lopinavir, nelfinavir, tipranavir, ritonavir -St. John's wort This list may not describe all possible interactions. Give your health care provider a list of all the medicines, herbs, non-prescription drugs, or dietary supplements you use. Also tell them if you smoke, drink alcohol, or use illegal drugs. Some items may  interact with your medicine. What should I watch for while using this medicine? This product does not protect you against HIV infection (AIDS) or other sexually transmitted diseases. You should be able to feel the implant by pressing your fingertips over the skin where it was inserted. Tell your doctor if you cannot feel the implant. What side effects may I notice from receiving this medicine? Side  effects that you should report to your doctor or health care professional as soon as possible: -allergic reactions like skin rash, itching or hives, swelling of the face, lips, or tongue -breast lumps -changes in vision -confusion, trouble speaking or understanding -dark urine -depressed mood -general ill feeling or flu-like symptoms -light-colored stools -loss of appetite, nausea -right upper belly pain -severe headaches -severe pain, swelling, or tenderness in the abdomen -shortness of breath, chest pain, swelling in a leg -signs of pregnancy -sudden numbness or weakness of the face, arm or leg -trouble walking, dizziness, loss of balance or coordination -unusual vaginal bleeding, discharge -unusually weak or tired -yellowing of the eyes or skin Side effects that usually do not require medical attention (Report these to your doctor or health care professional if they continue or are bothersome.): -acne -breast pain -changes in weight -cough -fever or chills -headache -irregular menstrual bleeding -itching, burning, and vaginal discharge -pain or difficulty passing urine -sore throat This list may not describe all possible side effects. Call your doctor for medical advice about side effects. You may report side effects to FDA at 1-800-FDA-1088. Where should I keep my medicine? This drug is given in a hospital or clinic and will not be stored at home. NOTE: This sheet is a summary. It may not cover all possible information. If you have questions about this medicine, talk to your doctor, pharmacist, or health care provider.  2014, [employer]vier/[employer] Standard. (2012-01-26 15:37:45)

## 2013-10-11 ENCOUNTER — Ambulatory Visit (HOSPITAL_COMMUNITY)
Admission: RE | Admit: 2013-10-11 | Discharge: 2013-10-11 | Disposition: A | Discharge status: Home / Self Care | Payer: Medicaid Other | Source: Ambulatory Visit | Attending: Obstetrics and Gynecology | Admitting: Obstetrics and Gynecology

## 2013-10-11 ENCOUNTER — Encounter (HOSPITAL_COMMUNITY): Payer: Self-pay

## 2013-10-11 ENCOUNTER — Ambulatory Visit (HOSPITAL_COMMUNITY): Payer: Medicaid Other | Admitting: Anesthesiology

## 2013-10-11 ENCOUNTER — Encounter (HOSPITAL_COMMUNITY)
Admission: RE | Disposition: A | Discharge status: Home / Self Care | Payer: Self-pay | Source: Ambulatory Visit | Attending: Obstetrics and Gynecology

## 2013-10-11 ENCOUNTER — Encounter (HOSPITAL_COMMUNITY): Payer: Medicaid Other | Admitting: Anesthesiology

## 2013-10-11 DIAGNOSIS — N83209 Unspecified ovarian cyst, unspecified side: Secondary | ICD-10-CM | POA: Insufficient documentation

## 2013-10-11 DIAGNOSIS — Z302 Encounter for sterilization: Secondary | ICD-10-CM | POA: Insufficient documentation

## 2013-10-11 DIAGNOSIS — D279 Benign neoplasm of unspecified ovary: Secondary | ICD-10-CM | POA: Insufficient documentation

## 2013-10-11 DIAGNOSIS — R1032 Left lower quadrant pain: Secondary | ICD-10-CM

## 2013-10-11 DIAGNOSIS — N83202 Unspecified ovarian cyst, left side: Secondary | ICD-10-CM

## 2013-10-11 DIAGNOSIS — F17200 Nicotine dependence, unspecified, uncomplicated: Secondary | ICD-10-CM | POA: Diagnosis not present

## 2013-10-11 HISTORY — PX: LAPAROSCOPIC UNILATERAL SALPINGO OOPHERECTOMY: SHX5935

## 2013-10-11 HISTORY — PX: BILATERAL SALPINGECTOMY: SHX5743

## 2013-10-11 LAB — LIPID PANEL
Cholesterol: 154 mg/dL (ref 0–200)
HDL: 36 mg/dL — ABNORMAL LOW (ref >39)
LDL Cholesterol: 96 mg/dL (ref 0–99)
Total CHOL/HDL Ratio: 4.3 Ratio
Triglycerides: 110 mg/dL (ref <150)
VLDL: 22 mg/dL (ref 0–40)

## 2013-10-11 LAB — COMPREHENSIVE METABOLIC PANEL
AST: 13 U/L (ref 0–37)
Albumin: 4.3 g/dL (ref 3.5–5.2)
Alkaline Phosphatase: 48 U/L (ref 39–117)
BUN: 20 mg/dL (ref 6–23)
CALCIUM: 8.8 mg/dL (ref 8.4–10.5)
CHLORIDE: 103 meq/L (ref 96–112)
CO2: 29 meq/L (ref 19–32)
CREATININE: 0.72 mg/dL (ref 0.50–1.10)
Glucose, Bld: 90 mg/dL (ref 70–99)
Potassium: 4.1 mEq/L (ref 3.5–5.3)
Sodium: 139 mEq/L (ref 135–145)
Total Bilirubin: 0.6 mg/dL (ref 0.2–1.2)
Total Protein: 7 g/dL (ref 6.0–8.3)

## 2013-10-11 LAB — CBC
HEMATOCRIT: 37.8 % (ref 36.0–46.0)
Hemoglobin: 11.8 g/dL — ABNORMAL LOW (ref 12.0–15.0)
MCH: 29.9 pg (ref 26.0–34.0)
MCHC: 31.2 g/dL (ref 30.0–36.0)
MCV: 95.9 fL (ref 78.0–100.0)
Platelets: 259 10*3/uL (ref 150–400)
RBC: 3.94 MIL/uL (ref 3.87–5.11)
RDW: 13.7 % (ref 11.5–15.5)
WBC: 9.4 10*3/uL (ref 4.0–10.5)

## 2013-10-11 LAB — GC/CHLAMYDIA PROBE AMP
CT Probe RNA: NEGATIVE
GC Probe RNA: NEGATIVE

## 2013-10-11 LAB — TSH: TSH: 2.015 u[IU]/mL (ref 0.350–4.500)

## 2013-10-11 LAB — SEDIMENTATION RATE: Sed Rate: 18 mm/hr (ref 0–22)

## 2013-10-11 SURGERY — SALPINGO-OOPHORECTOMY, UNILATERAL, LAPAROSCOPIC
Anesthesia: General | Site: Abdomen | Laterality: Right

## 2013-10-11 MED ORDER — PROMETHAZINE HCL 25 MG/ML IJ SOLN
INTRAMUSCULAR | Status: AC
  Filled 2013-10-11: qty 1

## 2013-10-11 MED ORDER — ONDANSETRON HCL 4 MG/2ML IJ SOLN
4.0000 mg | Freq: Once | INTRAMUSCULAR | Status: AC
  Administered 2013-10-11: 4 mg via INTRAVENOUS

## 2013-10-11 MED ORDER — OXYCODONE-ACETAMINOPHEN 5-325 MG PO TABS: 1.0000 | ORAL_TABLET | ORAL | Status: DC | PRN

## 2013-10-11 MED ORDER — LACTATED RINGERS IV SOLN
INTRAVENOUS | Status: DC
  Administered 2013-10-11 (×2): via INTRAVENOUS

## 2013-10-11 MED ORDER — ONDANSETRON HCL 4 MG/2ML IJ SOLN
INTRAMUSCULAR | Status: AC
  Filled 2013-10-11: qty 2

## 2013-10-11 MED ORDER — FENTANYL CITRATE 0.05 MG/ML IJ SOLN
INTRAMUSCULAR | Status: AC
  Filled 2013-10-11: qty 2

## 2013-10-11 MED ORDER — ROCURONIUM BROMIDE 50 MG/5ML IV SOLN
INTRAVENOUS | Status: AC
  Filled 2013-10-11: qty 1

## 2013-10-11 MED ORDER — VASOPRESSIN 20 UNIT/ML IJ SOLN
INTRAMUSCULAR | Status: AC
  Filled 2013-10-11: qty 1

## 2013-10-11 MED ORDER — CEFAZOLIN SODIUM-DEXTROSE 2-3 GM-% IV SOLR
2.0000 g | INTRAVENOUS | Status: AC
  Administered 2013-10-11: 2 g via INTRAVENOUS

## 2013-10-11 MED ORDER — CEFAZOLIN SODIUM-DEXTROSE 2-3 GM-% IV SOLR
INTRAVENOUS | Status: AC
  Filled 2013-10-11: qty 50

## 2013-10-11 MED ORDER — MIDAZOLAM HCL 2 MG/2ML IJ SOLN
INTRAMUSCULAR | Status: AC
  Filled 2013-10-11: qty 2

## 2013-10-11 MED ORDER — NEOSTIGMINE METHYLSULFATE 1 MG/ML IJ SOLN
INTRAMUSCULAR | Status: AC
  Filled 2013-10-11: qty 1

## 2013-10-11 MED ORDER — ROCURONIUM BROMIDE 100 MG/10ML IV SOLN
INTRAVENOUS | Status: DC | PRN
  Administered 2013-10-11: 5 mg via INTRAVENOUS
  Administered 2013-10-11: 35 mg via INTRAVENOUS

## 2013-10-11 MED ORDER — GLYCOPYRROLATE 0.2 MG/ML IJ SOLN
0.2000 mg | Freq: Once | INTRAMUSCULAR | Status: AC
  Administered 2013-10-11: 0.2 mg via INTRAVENOUS

## 2013-10-11 MED ORDER — PROPOFOL 10 MG/ML IV BOLUS
INTRAVENOUS | Status: AC
  Filled 2013-10-11: qty 20

## 2013-10-11 MED ORDER — PROMETHAZINE HCL 25 MG/ML IJ SOLN
12.5000 mg | Freq: Once | INTRAMUSCULAR | Status: AC
  Administered 2013-10-11: 25 mg via INTRAVENOUS

## 2013-10-11 MED ORDER — KETOROLAC TROMETHAMINE 30 MG/ML IJ SOLN
INTRAMUSCULAR | Status: AC
  Filled 2013-10-11: qty 1

## 2013-10-11 MED ORDER — NEOSTIGMINE METHYLSULFATE 1 MG/ML IJ SOLN
INTRAMUSCULAR | Status: DC | PRN
  Administered 2013-10-11: 2 mg via INTRAVENOUS

## 2013-10-11 MED ORDER — PROPOFOL 10 MG/ML IV BOLUS
INTRAVENOUS | Status: DC | PRN
  Administered 2013-10-11: 50 mg via INTRAVENOUS
  Administered 2013-10-11: 200 mg via INTRAVENOUS
  Administered 2013-10-11 (×2): 50 mg via INTRAVENOUS

## 2013-10-11 MED ORDER — PROMETHAZINE HCL 25 MG/ML IJ SOLN: 25.0000 mg | Freq: Once | INTRAMUSCULAR | Status: DC

## 2013-10-11 MED ORDER — LIDOCAINE HCL (PF) 1 % IJ SOLN
INTRAMUSCULAR | Status: AC
  Filled 2013-10-11: qty 5

## 2013-10-11 MED ORDER — GLYCOPYRROLATE 0.2 MG/ML IJ SOLN
INTRAMUSCULAR | Status: DC | PRN
  Administered 2013-10-11: 0.4 mg via INTRAVENOUS

## 2013-10-11 MED ORDER — BUPIVACAINE HCL (PF) 0.5 % IJ SOLN
INTRAMUSCULAR | Status: AC
  Filled 2013-10-11: qty 30

## 2013-10-11 MED ORDER — GLYCOPYRROLATE 0.2 MG/ML IJ SOLN
INTRAMUSCULAR | Status: AC
  Filled 2013-10-11: qty 2

## 2013-10-11 MED ORDER — BUPIVACAINE HCL (PF) 0.5 % IJ SOLN
INTRAMUSCULAR | Status: DC | PRN
  Administered 2013-10-11: 15 mL

## 2013-10-11 MED ORDER — LIDOCAINE HCL (CARDIAC) 10 MG/ML IV SOLN
INTRAVENOUS | Status: DC | PRN
  Administered 2013-10-11: 10 mg via INTRAVENOUS

## 2013-10-11 MED ORDER — MIDAZOLAM HCL 2 MG/2ML IJ SOLN
2.0000 mg | Freq: Once | INTRAMUSCULAR | Status: AC
  Administered 2013-10-11: 2 mg via INTRAVENOUS

## 2013-10-11 MED ORDER — PROPOFOL 10 MG/ML IV EMUL
INTRAVENOUS | Status: AC
  Filled 2013-10-11: qty 20

## 2013-10-11 MED ORDER — MIDAZOLAM HCL 2 MG/2ML IJ SOLN
1.0000 mg | INTRAMUSCULAR | Status: DC | PRN
  Administered 2013-10-11: 2 mg via INTRAVENOUS

## 2013-10-11 MED ORDER — KETOROLAC TROMETHAMINE 30 MG/ML IJ SOLN
30.0000 mg | Freq: Once | INTRAMUSCULAR | Status: AC
  Administered 2013-10-11: 30 mg via INTRAVENOUS

## 2013-10-11 MED ORDER — 0.9 % SODIUM CHLORIDE (POUR BTL) OPTIME
TOPICAL | Status: DC | PRN
  Administered 2013-10-11: 1000 mL

## 2013-10-11 MED ORDER — FENTANYL CITRATE 0.05 MG/ML IJ SOLN
INTRAMUSCULAR | Status: AC
  Filled 2013-10-11: qty 5

## 2013-10-11 MED ORDER — FENTANYL CITRATE 0.05 MG/ML IJ SOLN
25.0000 ug | INTRAMUSCULAR | Status: DC | PRN
  Administered 2013-10-11 (×6): 50 ug via INTRAVENOUS

## 2013-10-11 MED ORDER — FENTANYL CITRATE 0.05 MG/ML IJ SOLN
INTRAMUSCULAR | Status: DC | PRN
  Administered 2013-10-11 (×4): 50 ug via INTRAVENOUS
  Administered 2013-10-11: 100 ug via INTRAVENOUS
  Administered 2013-10-11 (×2): 50 ug via INTRAVENOUS
  Administered 2013-10-11: 100 ug via INTRAVENOUS
  Administered 2013-10-11 (×2): 50 ug via INTRAVENOUS

## 2013-10-11 MED ORDER — ONDANSETRON HCL 4 MG/2ML IJ SOLN: 4.0000 mg | Freq: Once | INTRAMUSCULAR | Status: DC | PRN

## 2013-10-11 MED ORDER — GLYCOPYRROLATE 0.2 MG/ML IJ SOLN
INTRAMUSCULAR | Status: AC
  Filled 2013-10-11: qty 1

## 2013-10-11 SURGICAL SUPPLY — 51 items
BAG HAMPER (MISCELLANEOUS) ×5 IMPLANT
BAG SPEC RTRVL LRG 6X4 10 (ENDOMECHANICALS) ×2
BLADE SURG SZ11 CARB STEEL (BLADE) ×5 IMPLANT
CHLORAPREP W/TINT 26ML (MISCELLANEOUS) ×5 IMPLANT
CLOSURE WOUND 1/2 X4 (GAUZE/BANDAGES/DRESSINGS) ×1
CLOSURE WOUND 1/4 X3 (GAUZE/BANDAGES/DRESSINGS) ×1
CLOTH BEACON ORANGE TIMEOUT ST (SAFETY) ×5 IMPLANT
COVER LIGHT HANDLE STERIS (MISCELLANEOUS) ×10 IMPLANT
DURAPREP 26ML APPLICATOR (WOUND CARE) IMPLANT
ELECT REM PT RETURN 9FT ADLT (ELECTROSURGICAL) ×5
ELECTRODE REM PT RTRN 9FT ADLT (ELECTROSURGICAL) ×3 IMPLANT
FILTER SMOKE EVAC LAPAROSHD (FILTER) ×5 IMPLANT
GLOVE BIOGEL PI IND STRL 7.0 (GLOVE) ×12 IMPLANT
GLOVE BIOGEL PI INDICATOR 7.0 (GLOVE) ×8
GLOVE ECLIPSE 9.0 STRL (GLOVE) ×10 IMPLANT
GLOVE INDICATOR STER SZ 9 (GLOVE) ×5 IMPLANT
GLOVE OPTIFIT SS 6.5 STRL BRWN (GLOVE) ×5 IMPLANT
GOWN SPEC L3 XXLG W/TWL (GOWN DISPOSABLE) ×5 IMPLANT
GOWN STRL REUS W/TWL LRG LVL3 (GOWN DISPOSABLE) ×10 IMPLANT
INST SET LAPROSCOPIC GYN AP (KITS) ×5 IMPLANT
KIT ROOM TURNOVER AP CYSTO (KITS) ×5 IMPLANT
LIGASURE 5MM LAPAROSCOPIC (INSTRUMENTS) ×5 IMPLANT
MANIFOLD NEPTUNE II (INSTRUMENTS) ×5 IMPLANT
NEEDLE HYPO 18GX1.5 BLUNT FILL (NEEDLE) IMPLANT
NEEDLE HYPO 25X1 1.5 SAFETY (NEEDLE) ×5 IMPLANT
NEEDLE INSUFFLATION 14GA 120MM (NEEDLE) ×5 IMPLANT
PACK PERI GYN (CUSTOM PROCEDURE TRAY) ×5 IMPLANT
PAD ARMBOARD 7.5X6 YLW CONV (MISCELLANEOUS) ×5 IMPLANT
POUCH SPECIMEN RETRIEVAL 10MM (ENDOMECHANICALS) ×5 IMPLANT
SCALPEL HARMONIC ACE (MISCELLANEOUS) IMPLANT
SET BASIN LINEN APH (SET/KITS/TRAYS/PACK) ×5 IMPLANT
SET TUBE IRRIG SUCTION NO TIP (IRRIGATION / IRRIGATOR) IMPLANT
SLEEVE ENDOPATH XCEL 5M (ENDOMECHANICALS) ×5 IMPLANT
SOLUTION ANTI FOG 6CC (MISCELLANEOUS) ×5 IMPLANT
SPONGE GAUZE 2X2 8PLY STER LF (GAUZE/BANDAGES/DRESSINGS) ×1
SPONGE GAUZE 2X2 8PLY STRL LF (GAUZE/BANDAGES/DRESSINGS) ×4 IMPLANT
STRIP CLOSURE SKIN 1/2X4 (GAUZE/BANDAGES/DRESSINGS) ×4 IMPLANT
STRIP CLOSURE SKIN 1/4X3 (GAUZE/BANDAGES/DRESSINGS) ×4 IMPLANT
SUT VIC AB 4-0 PS2 27 (SUTURE) ×5 IMPLANT
SUT VICRYL 0 UR6 27IN ABS (SUTURE) ×5 IMPLANT
SYR 30ML LL (SYRINGE) ×5 IMPLANT
SYR BULB IRRIGATION 50ML (SYRINGE) ×5 IMPLANT
SYR CONTROL 10ML LL (SYRINGE) ×5 IMPLANT
SYRINGE 10CC LL (SYRINGE) ×5 IMPLANT
TRAY FOLEY CATH 16FR SILVER (SET/KITS/TRAYS/PACK) ×5 IMPLANT
TROCAR ENDO BLADELESS 11MM (ENDOMECHANICALS) ×5 IMPLANT
TROCAR XCEL NON-BLD 5MMX100MML (ENDOMECHANICALS) ×5 IMPLANT
TROCAR Z-THAD FIOS HNDL 12X100 (TROCAR) IMPLANT
TUBING DYE INJECTION 900-617 (MISCELLANEOUS) IMPLANT
TUBING INSUFFLATION (TUBING) ×5 IMPLANT
WARMER LAPAROSCOPE (MISCELLANEOUS) ×5 IMPLANT
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
## 2013-10-11 NOTE — Discharge Instructions (Addendum)
Laparoscopic Tubal Ligation  Laparoscopic tubal ligation is a procedure that closes the fallopian tubes at a time other than right after childbirth. By closing the fallopian tubes, the eggs that are released from the ovaries cannot enter the uterus and sperm cannot reach the egg. Tubal ligation is also known as getting your "tubes tied." Tubal ligation is done so you will not be able to get pregnant or have a baby.   Although this procedure may be reversed, it should be considered permanent and irreversible. If you want to have future pregnancies, you should not have this procedure.   LET YOUR CAREGIVER KNOW ABOUT:  · Allergies to food or medicine.  · Medicines taken, including vitamins, herbs, eyedrops, over-the-counter medicines, and creams.  · Use of steroids (by mouth or creams).  · Previous problems with numbing medicines.  · History of bleeding problems or blood clots.  · Any recent colds or infections.  · Previous surgery.  · Other health problems, including diabetes and kidney problems.  · Possibility of pregnancy, if this applies.  · Any past pregnancies.  RISKS AND COMPLICATIONS   · Infection.  · Bleeding.  · Injury to surrounding organs.  · Anesthetic side effects.  · Failure of the procedure.  · Ectopic pregnancy.  · Future regret about having the procedure done.  BEFORE THE PROCEDURE  · Do not take aspirin or blood thinners a week before the procedure or as directed. This can cause bleeding.  · Do not eat or drink anything 6 to 8 hours before the procedure.  PROCEDURE   · You may be given a medicine to help you relax (sedative) before the procedure. You will be given a medicine to make you sleep (general anesthetic) during the procedure.  · A tube will be put down your throat to help your breath while under general anesthesia.  · Two small cuts (incisions) are made in the lower abdominal area and near the belly button.  · Your abdominal area will be inflated with a safe gas (carbon dioxide). This helps  give the surgeon room to operate, visualize, and helps the surgeon avoid other organs.  · A thin, lighted tube (laparoscope) with a camera attached is inserted into your abdomen through one of the incisions near the belly button. Other small instruments are also inserted through the other abdominal incision.  · The fallopian tubes are located and are either blocked with a ring, clip, or are burned (cauterized).  · After the fallopian tubes are blocked, the gas is released from the abdomen.  · The incisions will be closed with stitches (sutures), and a bandage may be placed over the incisions.  AFTER THE PROCEDURE   · You will rest in a recovery room for 1 4 hours until you are stable and doing well.  · You will also have some mild abdominal discomfort for 3 7 days. You will be given pain medicine to ease any discomfort.  · As long as there are no problems, you may be allowed to go home. Someone will need to drive you home and be with you for at least 24 hours once home.  · You may have some mild discomfort in the throat. This is from the tube placed in your throat while you were sleeping.  · You may experience discomfort in the shoulder area from some trapped air between the liver and diaphragm. This sensation is normal and will slowly go away on its own.  Document Released: 10/27/2000 Document 

## 2013-10-11 NOTE — Transfer of Care (Signed)
Immediate Anesthesia Transfer of Care Note  Patient: Catherine Ramirez  Procedure(s) Performed: Procedure(s) (LRB):  LAPAROSCOPIC LEFT SALPINGO OOPHORECTOMY (Left) RIGHT SALPINGECTOMY (Right)  Patient Location: PACU  Anesthesia Type: General  Level of Consciousness: awake  Airway & Oxygen Therapy: Patient Spontanous Breathing and non-rebreather face mask  Post-op Assessment: Report given to PACU RN, Post -op Vital signs reviewed and stable and Patient moving all extremities  Post vital signs: Reviewed and stable  Complications: No apparent anesthesia complications

## 2013-10-11 NOTE — Anesthesia Postprocedure Evaluation (Signed)
  Anesthesia Post-op Note  Patient: Catherine Ramirez  Procedure(s) Performed: Procedure(s):  LAPAROSCOPIC LEFT SALPINGO OOPHORECTOMY (Left) RIGHT SALPINGECTOMY (Right)  Patient Location: PACU  Anesthesia Type:General  Level of Consciousness: alert  and patient cooperative  Airway and Oxygen Therapy: Patient Spontanous Breathing and non-rebreather face mask  Post-op Pain: severe Receiving narcotics and Versed in PACU.  Post-op Assessment: Post-op Vital signs reviewed, Patient's Cardiovascular Status Stable, Respiratory Function Stable and No signs of Nausea or vomiting  Post-op Vital Signs: Reviewed and stable  Complications: No apparent anesthesia complications
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
## 2013-10-11 NOTE — Op Note (Signed)
10/11/2013  12:19 PM  PATIENT:  Catherine Ramirez  28 y.o. female  PRE-OPERATIVE DIAGNOSIS:  left ovarian cyst, llq pain desire for permanent sterilization  POST-OPERATIVE DIAGNOSIS:  left ovarian cyst, desire for permanent sterilization  PROCEDURE:  Procedure(s):  LAPAROSCOPIC LEFT SALPINGO OOPHORECTOMY (Left) RIGHT SALPINGECTOMY (Right)  SURGEON:  Surgeon(s) and Role:    * [name], MD - Primary  PHYSICIAN ASSISTANT:   ASSISTANTS: Kendrick CST   ANESTHESIA:   local and general  EBL:  Total I/O In: 1000 [I.V.:1000] Out: 80 [Urine:30; Blood:20] Details of procedure patient was taken to the operating room prepped and draped for combined abdominal and vaginal procedure, Ancef administered 2 g, abdomen prepped and draped, and timeout confirmed surgical procedure. Foley catheter was in place, and uterus was manipulated by a Hulka tenaculum. Attention was then directed to the abdomen where a midline vertical infraumbilical 1 center skin incision was made, transverse suprapubic 2 cm incision, and right lower quadrant trocar insertion performed. The Veress needle was used with care oriented toward the pelvis, and water droplet technique confirmed intraperitoneal free flow before instilling carbon dioxide 2.4 L and inserting the laparoscopic 11 mm trocar under direct visualization. Pelvis was inspected there was no evidence of bleeding. The suprapubic trocar was placed under direct visualization, as was the right lower quadrant trocar. Trendelenburg position was assigned, and attention directed the pelvis with the large somewhat ecchymotic right ovarian cyst identified. The surface was smooth with no suspicious characteristics the cyst could be elevated off the sidewall with only a small number of adhesions near the utero-ovarian ligament which could be doubly broken up. Retroperitoneal inspection visualization confirmed peristalsis of the ureters well out of the surgical area. Attention was  first directed the right side where the fallopian tube could be placed on countertraction elevated and the LigaSure used perform a salpingectomy beginning beneath the fimbria, and marking to the tubal cornu and removing the tube in total, and extracting it through the suprapubic trocar site. Next  The surgeon moved to the left side, placed a fourth trocar in the left lower quadrant under direct visualization,, and attention to the left tube and ovary then followed. Deflation of the cyst by needle aspirating removed 75 cc of clear yellow fluid and allowed easy manipulation of the ovary. There was not enough normal tissue to try to salvage so left salpingo-oophorectomy was performed. Again the ureter could be visualized. The infundibulopelvic ligament was then serially clamped coagulated and transected using small bites with the LigaSure, staying close to the ovary and well away from retro peritoneal structures Tube and ovary were then placed in an Endo Catch bag, and extracted through the suprapubic trocar site which was widened slightly the fascial layer to allow intact removal of the cystic structure within the bag. Pelvis was inspected and all pedicles were visualized as hemostatic photos were documented. Deflation of abdomen, with instilling 120 cc of saline was followed, then laparoscopic trochars removed,, and the fascia closed at the suprapubic and umbilical site. Subcuticular 4-0 Vicryl closure of all 4 incisions followed and then Steri-Strips were applied. Marcaine was injected at all 4 incision sites The patient woke up with significant agitation, and required much attention to keep her from time to set up or roll during awakening, it was safely transferred to the stretcher and taken to recovery room. Sponge and needle counts correct patient to recovery room alert upset tearful but stable from anesthesia and surgical standpoint

## 2013-10-11 NOTE — Brief Op Note (Signed)
10/11/2013  12:19 PM  PATIENT:  Barbera Setters  28 y.o. female  PRE-OPERATIVE DIAGNOSIS:  left ovarian cyst, llq pain desire for permanent sterilization  POST-OPERATIVE DIAGNOSIS:  left ovarian cyst, desire for permanent sterilization  PROCEDURE:  Procedure(s):  LAPAROSCOPIC LEFT SALPINGO OOPHORECTOMY (Left) RIGHT SALPINGECTOMY (Right)  SURGEON:  Surgeon(s) and Role:    * [name], MD - Primary  PHYSICIAN ASSISTANT:   ASSISTANTS: Kendrick CST   ANESTHESIA:   local and general  EBL:  Total I/O In: 1000 [I.V.:1000] Out: 66 [Urine:30; Blood:20]  BLOOD ADMINISTERED:none  DRAINS: none   LOCAL MEDICATIONS USED:  MARCAINE    and Amount: 15 ml  SPECIMEN:  Source of Specimen:  Left fallopian tube and ovary, right fallopian tube  DISPOSITION OF SPECIMEN:  PATHOLOGY  COUNTS:  YES  TOURNIQUET:  * No tourniquets in log *  DICTATION: .Dragon Dictation  PLAN OF CARE: Discharge to home after PACU  PATIENT DISPOSITION:  PACU - hemodynamically stable.   Delay start of Pharmacological VTE agent (>24hrs) due to surgical blood loss or risk of bleeding: not applicable

## 2013-10-11 NOTE — Anesthesia Preprocedure Evaluation (Signed)
Anesthesia Evaluation  Patient identified by MRN, date of birth, ID band Patient awake    Reviewed: Allergy & Precautions, H&P , NPO status , Patient's Chart, lab work & pertinent test results  Airway Mallampati: II TM Distance: >3 FB Neck ROM: Full    Dental  (+) Teeth Intact   Pulmonary Current Smoker,  breath sounds clear to auscultation        Cardiovascular negative cardio ROS  Rhythm:Regular Rate:Normal     Neuro/Psych    GI/Hepatic negative GI ROS,   Endo/Other    Renal/GU      Musculoskeletal   Abdominal   Peds  Hematology   Anesthesia Other Findings   Reproductive/Obstetrics                           Anesthesia Physical Anesthesia Plan  ASA: II  Anesthesia Plan: General   Post-op Pain Management:    Induction: Intravenous  Airway Management Planned: Oral ETT  Additional Equipment:   Intra-op Plan:   Post-operative Plan: Extubation in OR  Informed Consent: I have reviewed the patients History and Physical, chart, labs and discussed the procedure including the risks, benefits and alternatives for the proposed anesthesia with the patient or authorized representative who has indicated his/her understanding and acceptance.     Plan Discussed with:   Anesthesia Plan Comments:         Anesthesia Quick Evaluation

## 2013-10-11 NOTE — Anesthesia Procedure Notes (Addendum)
Procedure Name: Intubation Date/Time: 10/11/2013 10:56 AM Performed by: Vista Deck Pre-anesthesia Checklist: Patient identified, Patient being monitored, Timeout performed, Emergency Drugs available and Suction available Patient Re-evaluated:Patient Re-evaluated prior to inductionOxygen Delivery Method: Circle System Utilized Preoxygenation: Pre-oxygenation with 100% oxygen Intubation Type: IV induction Ventilation: Mask ventilation without difficulty and Oral airway inserted - appropriate to patient size Laryngoscope Size: Mac and 3 Grade View: Grade II Tube type: Oral Tube size: 7.0 mm Number of attempts: 2 Airway Equipment and Method: stylet Placement Confirmation: ETT inserted through vocal cords under direct vision,  positive ETCO2 and breath sounds checked- equal and bilateral Secured at: 21 cm Tube secured with: Tape Dental Injury: Teeth and Oropharynx as per pre-operative assessment  Comments: Laryngoscopy with Miller 2, poor view. Ventilated and grade 2 view with Mac 3 blade.

## 2013-10-12 ENCOUNTER — Inpatient Hospital Stay (HOSPITAL_COMMUNITY)
Admission: AD | Admit: 2013-10-12 | Discharge: 2013-10-18 | DRG: 941 | Disposition: A | Discharge status: Home / Self Care | Payer: Medicaid Other | Source: Ambulatory Visit | Attending: Obstetrics and Gynecology | Admitting: Obstetrics and Gynecology

## 2013-10-12 ENCOUNTER — Encounter (HOSPITAL_COMMUNITY): Payer: Self-pay

## 2013-10-12 ENCOUNTER — Encounter: Payer: Self-pay | Admitting: Obstetrics and Gynecology

## 2013-10-12 ENCOUNTER — Ambulatory Visit (INDEPENDENT_AMBULATORY_CARE_PROVIDER_SITE_OTHER): Payer: Medicaid Other | Admitting: Obstetrics and Gynecology

## 2013-10-12 ENCOUNTER — Other Ambulatory Visit: Payer: Self-pay | Admitting: Obstetrics and Gynecology

## 2013-10-12 VITALS — BP 112/78

## 2013-10-12 DIAGNOSIS — Z23 Encounter for immunization: Secondary | ICD-10-CM

## 2013-10-12 DIAGNOSIS — D649 Anemia, unspecified: Secondary | ICD-10-CM | POA: Diagnosis present

## 2013-10-12 DIAGNOSIS — R109 Unspecified abdominal pain: Principal | ICD-10-CM

## 2013-10-12 DIAGNOSIS — G8918 Other acute postprocedural pain: Secondary | ICD-10-CM

## 2013-10-12 DIAGNOSIS — D271 Benign neoplasm of left ovary: Secondary | ICD-10-CM

## 2013-10-12 DIAGNOSIS — Y836 Removal of other organ (partial) (total) as the cause of abnormal reaction of the patient, or of later complication, without mention of misadventure at the time of the procedure: Secondary | ICD-10-CM | POA: Diagnosis present

## 2013-10-12 DIAGNOSIS — K5909 Other constipation: Secondary | ICD-10-CM

## 2013-10-12 DIAGNOSIS — R1032 Left lower quadrant pain: Secondary | ICD-10-CM

## 2013-10-12 DIAGNOSIS — K5901 Slow transit constipation: Secondary | ICD-10-CM

## 2013-10-12 DIAGNOSIS — K5900 Constipation, unspecified: Secondary | ICD-10-CM | POA: Diagnosis present

## 2013-10-12 LAB — CBC WITH DIFFERENTIAL/PLATELET
BASOS ABS: 0 10*3/uL (ref 0.0–0.1)
Basophils Relative: 0 % (ref 0–1)
Eosinophils Absolute: 0.1 10*3/uL (ref 0.0–0.7)
Eosinophils Relative: 1 % (ref 0–5)
HEMATOCRIT: 30.4 % — AB (ref 36.0–46.0)
HEMOGLOBIN: 10.2 g/dL — AB (ref 12.0–15.0)
LYMPHS ABS: 2.4 10*3/uL (ref 0.7–4.0)
Lymphocytes Relative: 28 % (ref 12–46)
MCH: 31.3 pg (ref 26.0–34.0)
MCHC: 33.6 g/dL (ref 30.0–36.0)
MCV: 93.3 fL (ref 78.0–100.0)
MONOS PCT: 5 % (ref 3–12)
Monocytes Absolute: 0.4 10*3/uL (ref 0.1–1.0)
NEUTROS ABS: 5.9 10*3/uL (ref 1.7–7.7)
Neutrophils Relative %: 66 % (ref 43–77)
Platelets: 193 10*3/uL (ref 150–400)
RBC: 3.26 MIL/uL — AB (ref 3.87–5.11)
RDW: 13.3 % (ref 11.5–15.5)
WBC: 8.8 10*3/uL (ref 4.0–10.5)

## 2013-10-12 LAB — COMPREHENSIVE METABOLIC PANEL
ALT: 5 U/L (ref 0–35)
AST: 12 U/L (ref 0–37)
Albumin: 2.8 g/dL — ABNORMAL LOW (ref 3.5–5.2)
Alkaline Phosphatase: 46 U/L (ref 39–117)
BUN: 15 mg/dL (ref 6–23)
CALCIUM: 8.2 mg/dL — AB (ref 8.4–10.5)
CHLORIDE: 106 meq/L (ref 96–112)
CO2: 28 meq/L (ref 19–32)
Creatinine, Ser: 0.72 mg/dL (ref 0.50–1.10)
GFR calc non Af Amer: >90 mL/min (ref >90)
Glucose, Bld: 84 mg/dL (ref 70–99)
POTASSIUM: 3.8 meq/L (ref 3.7–5.3)
SODIUM: 141 meq/L (ref 137–147)
TOTAL PROTEIN: 6.5 g/dL (ref 6.0–8.3)
Total Bilirubin: 0.3 mg/dL (ref 0.3–1.2)

## 2013-10-12 MED ORDER — ONDANSETRON HCL 4 MG/2ML IJ SOLN
4.0000 mg | Freq: Four times a day (QID) | INTRAMUSCULAR | Status: DC | PRN
  Administered 2013-10-13 – 2013-10-16 (×6): 4 mg via INTRAVENOUS
  Filled 2013-10-12 (×6): qty 2

## 2013-10-12 MED ORDER — SENNA 8.6 MG PO TABS
1.0000 | ORAL_TABLET | Freq: Two times a day (BID) | ORAL | Status: DC
  Administered 2013-10-12 – 2013-10-18 (×11): 8.6 mg via ORAL
  Filled 2013-10-12 (×12): qty 1

## 2013-10-12 MED ORDER — HYDROMORPHONE HCL PF 2 MG/ML IJ SOLN: 1.0000 mg | INTRAMUSCULAR | Status: DC | PRN

## 2013-10-12 MED ORDER — DEXTROSE-NACL 5-0.45 % IV SOLN: INTRAVENOUS | Status: DC

## 2013-10-12 MED ORDER — KETOROLAC TROMETHAMINE 30 MG/ML IJ SOLN
30.0000 mg | Freq: Four times a day (QID) | INTRAMUSCULAR | Status: AC
  Administered 2013-10-12 – 2013-10-13 (×5): 30 mg via INTRAVENOUS
  Filled 2013-10-12 (×5): qty 1

## 2013-10-12 MED ORDER — OXYCODONE-ACETAMINOPHEN 5-325 MG PO TABS: 1.0000 | ORAL_TABLET | ORAL | Status: DC | PRN

## 2013-10-12 MED ORDER — DEXTROSE-NACL 5-0.45 % IV SOLN
INTRAVENOUS | Status: AC
  Administered 2013-10-12: 17:00:00 via INTRAVENOUS

## 2013-10-12 MED ORDER — PRENATAL MULTIVITAMIN CH: 1.0000 | ORAL_TABLET | Freq: Every day | ORAL | Status: DC

## 2013-10-12 MED ORDER — HYDROMORPHONE HCL PF 1 MG/ML IJ SOLN
1.0000 mg | INTRAMUSCULAR | Status: DC | PRN
  Administered 2013-10-12: 1 mg via INTRAVENOUS

## 2013-10-12 MED ORDER — ACETAMINOPHEN 325 MG PO TABS: 650.0000 mg | ORAL_TABLET | ORAL | Status: DC | PRN

## 2013-10-12 MED ORDER — PNEUMOCOCCAL VAC POLYVALENT 25 MCG/0.5ML IJ INJ: 0.5000 mL | INJECTION | INTRAMUSCULAR | Status: AC

## 2013-10-12 MED ORDER — HYDROMORPHONE HCL PF 1 MG/ML IJ SOLN
1.0000 mg | INTRAMUSCULAR | Status: DC | PRN
  Administered 2013-10-12: 0.5 mg via INTRAVENOUS
  Administered 2013-10-12: 1.5 mg via INTRAVENOUS
  Administered 2013-10-13 (×7): 2 mg via INTRAVENOUS
  Administered 2013-10-14: 1 mg via INTRAVENOUS
  Administered 2013-10-14: 2 mg via INTRAVENOUS
  Administered 2013-10-14: 1 mg via INTRAVENOUS
  Administered 2013-10-14 (×3): 2 mg via INTRAVENOUS
  Administered 2013-10-14 – 2013-10-15 (×2): 1 mg via INTRAVENOUS
  Administered 2013-10-15 (×3): 2 mg via INTRAVENOUS
  Filled 2013-10-12 (×9): qty 2
  Filled 2013-10-12: qty 1
  Filled 2013-10-12 (×2): qty 2
  Filled 2013-10-12: qty 1
  Filled 2013-10-12: qty 2
  Filled 2013-10-12: qty 1
  Filled 2013-10-12 (×6): qty 2

## 2013-10-12 MED ORDER — OXYCODONE-ACETAMINOPHEN 5-325 MG PO TABS
2.0000 | ORAL_TABLET | ORAL | Status: DC | PRN
  Administered 2013-10-12 – 2013-10-15 (×13): 2 via ORAL
  Filled 2013-10-12 (×15): qty 2

## 2013-10-12 MED ORDER — KETOROLAC TROMETHAMINE 30 MG/ML IJ SOLN
30.0000 mg | Freq: Four times a day (QID) | INTRAMUSCULAR | Status: AC
  Filled 2013-10-12: qty 1

## 2013-10-12 MED ORDER — SIMETHICONE 80 MG PO CHEW
80.0000 mg | CHEWABLE_TABLET | Freq: Four times a day (QID) | ORAL | Status: DC | PRN
  Administered 2013-10-12 – 2013-10-14 (×2): 80 mg via ORAL
  Filled 2013-10-12 (×2): qty 1

## 2013-10-12 MED ORDER — HYDROMORPHONE HCL PF 1 MG/ML IJ SOLN
1.0000 mg | INTRAMUSCULAR | Status: DC | PRN
  Filled 2013-10-12: qty 1

## 2013-10-12 MED ORDER — ONDANSETRON HCL 4 MG PO TABS
4.0000 mg | ORAL_TABLET | Freq: Four times a day (QID) | ORAL | Status: DC | PRN
  Administered 2013-10-17: 4 mg via ORAL
  Filled 2013-10-12: qty 1

## 2013-10-12 MED ORDER — DEXTROSE-NACL 5-0.45 % IV SOLN
INTRAVENOUS | Status: DC
  Administered 2013-10-13 – 2013-10-15 (×6): via INTRAVENOUS

## 2013-10-12 NOTE — Progress Notes (Signed)
This chart was scribed by [name], Medical Scribe, for Dr. Mallory Shirk on 10/12/13 at 3:29 PM. This chart was reviewed by Dr. Mallory Shirk and is accurate.   Subjective:  Catherine Ramirez is a 28 y.o. female who presents to the clinic 1 day status post left salpingoophorectomy .   Here today for severe LLQ pain. Worse with deep breathing. Changing positions has caused an increase in belching and flatulence. Slept all day yesterday but woke up this morning with pain. Advised to be seen by Same Day Surgery. Feels the same as prior to surgery.  Review of Systems Negative except severe LLQ pain  She has been eating a regular diet with difficulty.   Bowel movements are normal. Pain is not well controlled.  Medications being used: narcotic analgesics including oxycodone/acetaminophen (Percocet, Tylox). Also took IBU without improvement.   Objective:  LMP 09/12/2013 General:Well developed, well nourished.  No acute distress. Chest clear,  cor RRR w.o murmur Abdomen: Bowel sounds normal, soft, non-tender. Pelvic Exam: deferred   Incision(s):   Healing well, no drainage, no erythema, no hernia, no swelling, no dehiscence, incisions well approximated.    Assessment:  Post-Op 1 weeks s/p left salpingoopherectomy   with uncontrolled pain     Plan:  1. Admission overnight for pain management Cbc bmet on admit. Consider CT in a.m. If indicated.

## 2013-10-13 ENCOUNTER — Encounter (HOSPITAL_COMMUNITY): Payer: Self-pay | Admitting: Obstetrics and Gynecology

## 2013-10-13 DIAGNOSIS — R1032 Left lower quadrant pain: Secondary | ICD-10-CM

## 2013-10-13 DIAGNOSIS — G8918 Other acute postprocedural pain: Secondary | ICD-10-CM

## 2013-10-13 DIAGNOSIS — D649 Anemia, unspecified: Secondary | ICD-10-CM

## 2013-10-13 DIAGNOSIS — K5901 Slow transit constipation: Secondary | ICD-10-CM

## 2013-10-13 LAB — CBC
HEMATOCRIT: 30.3 % — AB (ref 36.0–46.0)
Hemoglobin: 10.3 g/dL — ABNORMAL LOW (ref 12.0–15.0)
MCH: 31.8 pg (ref 26.0–34.0)
MCHC: 34 g/dL (ref 30.0–36.0)
MCV: 93.5 fL (ref 78.0–100.0)
Platelets: 182 10*3/uL (ref 150–400)
RBC: 3.24 MIL/uL — ABNORMAL LOW (ref 3.87–5.11)
RDW: 13.5 % (ref 11.5–15.5)
WBC: 8.6 10*3/uL (ref 4.0–10.5)

## 2013-10-13 LAB — URINALYSIS, ROUTINE W REFLEX MICROSCOPIC
Bilirubin Urine: NEGATIVE
Glucose, UA: NEGATIVE mg/dL
Ketones, ur: NEGATIVE mg/dL
Leukocytes, UA: NEGATIVE
Nitrite: NEGATIVE
PROTEIN: NEGATIVE mg/dL
Specific Gravity, Urine: 1.01 (ref 1.005–1.030)
Urobilinogen, UA: 0.2 mg/dL (ref 0.0–1.0)
pH: 6 (ref 5.0–8.0)

## 2013-10-13 LAB — URINE MICROSCOPIC-ADD ON

## 2013-10-13 MED ORDER — BISACODYL 10 MG RE SUPP: 10.0000 mg | Freq: Every day | RECTAL | Status: DC | PRN

## 2013-10-13 MED ORDER — POLYETHYLENE GLYCOL 3350 17 G PO PACK
17.0000 g | PACK | Freq: Every day | ORAL | Status: DC
  Administered 2013-10-13 – 2013-10-16 (×3): 17 g via ORAL
  Filled 2013-10-13 (×4): qty 1

## 2013-10-13 MED ORDER — PRO-STAT SUGAR FREE PO LIQD
30.0000 mL | Freq: Three times a day (TID) | ORAL | Status: DC
  Filled 2013-10-13: qty 30

## 2013-10-13 MED ORDER — FLEET ENEMA 7-19 GM/118ML RE ENEM
1.0000 | ENEMA | Freq: Once | RECTAL | Status: AC
  Administered 2013-10-14: 1 via RECTAL

## 2013-10-13 NOTE — Progress Notes (Signed)
UR completed 

## 2013-10-13 NOTE — Progress Notes (Signed)
Pt readmitted 1 day s/p laparoscopic left salpingoophorectomy, and right salpingectomy.Pain levels are significantly better, still requiring iv analgesics.   Subjective: Patient reports tolerating po liquids, but no appetite. Pt reports she has NOT had a bowel movement in 11 days, since 10/02/13. Pt routinely has bm up to q3-4 days. Additionally , the pt does not feel dehydrated as she did at admit. Voiding , but less than usual. .    Objective: I have reviewed patient's vital signs, intake and output, medications and labs.  General: alert, cooperative, mild distress and pale compared to prior to surgery. Resp: clear to auscultation bilaterally GI: normal findings: soft, non-tender and umbilicus normal, abnormal findings:  hypoactive bowel sounds and slight bloating diffuse. no guarding or rebound and incision: clean, dry and intact Vaginal Bleeding: none .  CBC    Component Value Date/Time   WBC 8.6 10/13/2013 0502   RBC 3.24* 10/13/2013 0502   HGB 10.3* 10/13/2013 0502   HCT 30.3* 10/13/2013 0502   PLT 182 10/13/2013 0502   MCV 93.5 10/13/2013 0502   MCH 31.8 10/13/2013 0502   MCHC 34.0 10/13/2013 0502   RDW 13.5 10/13/2013 0502   LYMPHSABS 2.4 10/12/2013 1917   MONOABS 0.4 10/12/2013 1917   EOSABS 0.1 10/12/2013 1917   BASOSABS 0.0 10/12/2013 1917   Hgb was 10.2 on admit yesterday pm, and was 11.8 preop.    Assessment: S/p laparoscopic left salpingoophorectomy, and right salpingectomy: suspect Postoperative intraabdominal bleeding, that at this point is not continuing Chronic constipation   Plan: Clear liquids Dulcolax suppositories tonight and repeat in a.m. Fleets enema tomorrow. Miralax 17 gm bid. rechk cbc  LOS: 1 day    Justiss Gerbino V 10/13/2013, 7:46 PM

## 2013-10-13 NOTE — Progress Notes (Addendum)
INITIAL NUTRITION ASSESSMENT  DOCUMENTATION CODES Per approved criteria  -Not applicable   INTERVENTION: Follow for diet advancement 30 ml Prostat TID, which provides 300 kcals and 45 grams protein  NUTRITION DIAGNOSIS: Inadequate oral intake related to decreased appetite, altered GI function as evidenced by PO: 0-50%.   Goal: Pt will meet >90% of estimated nutritional needs  Monitor:  Diet advancement, PO intake, labs, skin assessments, weight changes, I/O's  Reason for Assessment: MST=5  28 y.o. female  Admitting Dx: <principal problem not specified>  ASSESSMENT: Pt admitted for LLQ pain. S/p lt salpingoopherectomy x 1 week. Per MD notes, pt with uncontrolled pain since sx.  Pt asleep at time of visit. No family present. Pt was on a clear liquid diet with poor tolerance; PO: 0-50%. Poor appetite PTA.  Wt hx reveals hx of weight gain. Noted a 9# (6.7%) wt gain x 1 week, which is clinically significant. Noted a 11# (7.1%) wt loss since 01/2012, which is not clinically significant for time frame.  Pt does not meet criteria for malnutrition at this time.   Height: Ht Readings from Last 1 Encounters:  10/13/13 5\' 6"  (1.676 m)    Weight: Wt Readings from Last 1 Encounters:  10/13/13 144 lb 13.5 oz (65.7 kg)    Ideal Body Weight: 135#  % Ideal Body Weight: 107%  Wt Readings from Last 10 Encounters:  10/13/13 144 lb 13.5 oz (65.7 kg)  10/10/13 142 lb (64.411 kg)  10/07/13 135 lb (61.236 kg)  01/28/12 155 lb (70.308 kg)    Usual Body Weight: 155#  % Usual Body Weight: 93%  BMI:  Body mass index is 23.39 kg/(m^2). Meets criteria for normal weight.  Estimated Nutritional Needs: Kcal: 1400-1500 Protein: 53-66 grams daily Fluid: 1.4-1.5 L daily  Skin:  Abdominal incision  Diet Order: Clear Liquid  EDUCATION NEEDS: -Education not appropriate at this time   Intake/Output Summary (Last 24 hours) at 10/13/13 1921 Last data filed at 10/13/13 1840  Gross per 24  hour  Intake 2956.25 ml  Output    800 ml  Net 2156.25 ml    Last BM: 10/03/13   Labs:   Recent Labs Lab 10/08/13 0055 10/10/13 1023 10/12/13 1917  NA 139 139 141  K 3.6* 4.1 3.8  CL 103 103 106  CO2 25 29 28   BUN 22 20 15   CREATININE 0.66 0.72 0.72  CALCIUM 9.1 8.8 8.2*  GLUCOSE 77 90 84    CBG (last 3)  No results found for this basename: GLUCAP,  in the last 72 hours  Scheduled Meds: . senna  1 tablet Oral BID    Continuous Infusions: . dextrose 5 % and 0.45% NaCl 125 mL/hr at 10/13/13 1655    Past Medical History  Diagnosis Date  . Arrhythmia     Past Surgical History  Procedure Laterality Date  . Cesarean section    . Pelvic fracture surgery    . Tonsillectomy    . Tubal ligation    . Ovarian cyst removal Left   . Laparoscopic unilateral salpingo oopherectomy Left 10/11/2013    Procedure:  LAPAROSCOPIC LEFT SALPINGO OOPHORECTOMY;  Surgeon: [name], MD;  Location: [facility];  Service: Gynecology;  Laterality: Left;  . Bilateral salpingectomy Right 10/11/2013    Procedure: RIGHT SALPINGECTOMY;  Surgeon: [name], MD;  Location: [facility];  Service: Gynecology;  Laterality: Right;   Petronella Shuford A. Jimmye Norman, RD, LDN Pager: [phone]

## 2013-10-14 ENCOUNTER — Encounter: Payer: Self-pay | Admitting: Obstetrics and Gynecology

## 2013-10-14 DIAGNOSIS — G8918 Other acute postprocedural pain: Secondary | ICD-10-CM | POA: Diagnosis present

## 2013-10-14 DIAGNOSIS — D271 Benign neoplasm of left ovary: Secondary | ICD-10-CM | POA: Insufficient documentation

## 2013-10-14 DIAGNOSIS — K5900 Constipation, unspecified: Secondary | ICD-10-CM | POA: Insufficient documentation

## 2013-10-14 DIAGNOSIS — R110 Nausea: Secondary | ICD-10-CM

## 2013-10-14 LAB — CBC
HEMATOCRIT: 29.4 % — AB (ref 36.0–46.0)
Hemoglobin: 9.9 g/dL — ABNORMAL LOW (ref 12.0–15.0)
MCH: 31.5 pg (ref 26.0–34.0)
MCHC: 33.7 g/dL (ref 30.0–36.0)
MCV: 93.6 fL (ref 78.0–100.0)
PLATELETS: 169 10*3/uL (ref 150–400)
RBC: 3.14 MIL/uL — ABNORMAL LOW (ref 3.87–5.11)
RDW: 13.5 % (ref 11.5–15.5)
WBC: 10.3 10*3/uL (ref 4.0–10.5)

## 2013-10-14 NOTE — Progress Notes (Signed)
Pt insistent that Dr Glo Herring told her she could walk in the halls and leave the floor to go to cafeteria with her mom. Pt ambulates with no difficulty, mother with her.

## 2013-10-14 NOTE — H&P (Signed)
Progress Notes Service date: 10/12/2013 3:29 PM  Related encounter: Office Visit from 10/12/2013 in [facility]   This chart was scribed by [name], Medical Scribe, for Dr. Mallory Shirk on 10/12/13 at 3:29 PM. This chart was reviewed by Dr. Mallory Shirk and is accurate.   Subjective:   Catherine Ramirez is a 28 y.o. female who presents to the clinic 1 day status post left salpingoophorectomy .  Here today for severe LLQ pain. Worse with deep breathing. Changing positions has caused an increase in belching and flatulence. Slept all day yesterday but woke up this morning with pain. Advised to be seen by Same Day Surgery. Feels the same as prior to surgery.  Review of Systems  Negative except severe LLQ pain  She has been eating a regular diet with difficulty.  Bowel movements are normal. Pain is not well controlled. Medications being used: narcotic analgesics including oxycodone/acetaminophen (Percocet, Tylox). Also took IBU without improvement.   Objective:   LMP 09/12/2013  General:Well developed, well nourished. No acute distress. Chest clear,  cor RRR w.o murmur  Abdomen: Bowel sounds normal, soft, non-tender.  Pelvic Exam: deferred  Incision(s): Healing well, no drainage, no erythema, no hernia, no swelling, no dehiscence, incisions well approximated.   Assessment:   Post-Op 1 weeks s/p left salpingoopherectomy with uncontrolled pain   Plan:   1. Admission overnight for pain management  Cbc bmet on admit.  Consider CT in a.m. If indicated.

## 2013-10-14 NOTE — Progress Notes (Signed)
S/p readmission for  Postop pain, and suspected postop intraabdominal bleeding s/p laparoscopic left salpingoophorectomy, right salpingectomy. Found also not to have had BM in now 12 days. Hx chronic constipation with BM q 3-4 days, usually hard, firm small Subjective: Patient reports no BM yet , no flatus. Some abd bloating. .    Objective: I have reviewed patient's vital signs, medications and labs. CBC    Component Value Date/Time   WBC 10.3 10/14/2013 0447   RBC 3.14* 10/14/2013 0447   HGB 9.9* 10/14/2013 0447   HCT 29.4* 10/14/2013 0447   PLT 169 10/14/2013 0447   MCV 93.6 10/14/2013 0447   MCH 31.5 10/14/2013 0447   MCHC 33.7 10/14/2013 0447   RDW 13.5 10/14/2013 0447   LYMPHSABS 2.4 10/12/2013 1917   MONOABS 0.4 10/12/2013 1917   EOSABS 0.1 10/12/2013 1917   BASOSABS 0.0 10/12/2013 1917      General: alert, cooperative, mild distress and well hydrated Resp: clear to auscultation bilaterally GI: normal findings: bowel sounds normal, soft, non-tender and mild diffuse abd fullness, bowel sounds active , normal pitch and frequentcy, abnormal findings:  mild fullness and incision: clean, dry and intact Vaginal Bleeding: none   Assessment/Plan: Focus today on getting GI tract function returned, with Dulcolax suppos 8 am, Fleets enema at 10A, then consider other cathartics  Will consider CT over weekend if condition worsens Daily cbc    LOS: 2 days    Catherine Ramirez 10/14/2013, 8:23 AM

## 2013-10-14 NOTE — Progress Notes (Signed)
UR completed. Patient changed to inpatient- requiring IVF @ 125cc/hr and IV pain medication 

## 2013-10-14 NOTE — Progress Notes (Signed)
Pt returned to floor with oatmeal from cafeteria.

## 2013-10-15 ENCOUNTER — Inpatient Hospital Stay (HOSPITAL_COMMUNITY): Payer: Medicaid Other | Admitting: Anesthesiology

## 2013-10-15 ENCOUNTER — Other Ambulatory Visit: Payer: Self-pay | Admitting: Obstetrics and Gynecology

## 2013-10-15 ENCOUNTER — Encounter (HOSPITAL_COMMUNITY): Payer: Medicaid Other | Admitting: Anesthesiology

## 2013-10-15 ENCOUNTER — Encounter (HOSPITAL_COMMUNITY)
Admission: AD | Disposition: A | Discharge status: Home / Self Care | Payer: Self-pay | Source: Ambulatory Visit | Attending: Obstetrics and Gynecology

## 2013-10-15 DIAGNOSIS — G8918 Other acute postprocedural pain: Secondary | ICD-10-CM

## 2013-10-15 DIAGNOSIS — D649 Anemia, unspecified: Secondary | ICD-10-CM

## 2013-10-15 DIAGNOSIS — R1032 Left lower quadrant pain: Secondary | ICD-10-CM

## 2013-10-15 HISTORY — PX: DIAGNOSTIC LAPAROSCOPY WITH REMOVAL OF ECTOPIC PREGNANCY: SHX6449

## 2013-10-15 LAB — CBC
HEMATOCRIT: 27.5 % — AB (ref 36.0–46.0)
Hemoglobin: 9.3 g/dL — ABNORMAL LOW (ref 12.0–15.0)
MCH: 32 pg (ref 26.0–34.0)
MCHC: 33.8 g/dL (ref 30.0–36.0)
MCV: 94.5 fL (ref 78.0–100.0)
Platelets: 150 10*3/uL (ref 150–400)
RBC: 2.91 MIL/uL — ABNORMAL LOW (ref 3.87–5.11)
RDW: 13.5 % (ref 11.5–15.5)
WBC: 6.9 10*3/uL (ref 4.0–10.5)

## 2013-10-15 LAB — BASIC METABOLIC PANEL
BUN: 6 mg/dL (ref 6–23)
CHLORIDE: 107 meq/L (ref 96–112)
CO2: 27 mEq/L (ref 19–32)
Calcium: 8.2 mg/dL — ABNORMAL LOW (ref 8.4–10.5)
Creatinine, Ser: 0.82 mg/dL (ref 0.50–1.10)
GFR calc Af Amer: >90 mL/min (ref >90)
GFR calc non Af Amer: >90 mL/min (ref >90)
GLUCOSE: 89 mg/dL (ref 70–99)
POTASSIUM: 3.9 meq/L (ref 3.7–5.3)
Sodium: 140 mEq/L (ref 137–147)

## 2013-10-15 LAB — TYPE AND SCREEN
ABO/RH(D): O POS
Antibody Screen: NEGATIVE

## 2013-10-15 SURGERY — LAPAROSCOPY, WITH ECTOPIC PREGNANCY SURGICAL TREATMENT
Anesthesia: General | Site: Abdomen

## 2013-10-15 MED ORDER — GLYCOPYRROLATE 0.2 MG/ML IJ SOLN
INTRAMUSCULAR | Status: AC
  Filled 2013-10-15: qty 2

## 2013-10-15 MED ORDER — SODIUM CHLORIDE 0.9 % IR SOLN
Status: DC | PRN
  Administered 2013-10-15: 3000 mL

## 2013-10-15 MED ORDER — BUPIVACAINE HCL (PF) 0.5 % IJ SOLN
INTRAMUSCULAR | Status: DC | PRN
  Administered 2013-10-15: 6 mL

## 2013-10-15 MED ORDER — FENTANYL CITRATE 0.05 MG/ML IJ SOLN
INTRAMUSCULAR | Status: AC
  Filled 2013-10-15: qty 5

## 2013-10-15 MED ORDER — ROCURONIUM BROMIDE 50 MG/5ML IV SOLN
INTRAVENOUS | Status: AC
  Filled 2013-10-15: qty 1

## 2013-10-15 MED ORDER — SUCCINYLCHOLINE CHLORIDE 20 MG/ML IJ SOLN
INTRAMUSCULAR | Status: DC | PRN
  Administered 2013-10-15: 150 mg via INTRAVENOUS

## 2013-10-15 MED ORDER — FENTANYL CITRATE 0.05 MG/ML IJ SOLN
INTRAMUSCULAR | Status: DC | PRN
  Administered 2013-10-15: 100 ug via INTRAVENOUS
  Administered 2013-10-15: 50 ug via INTRAVENOUS
  Administered 2013-10-15 (×2): 100 ug via INTRAVENOUS
  Administered 2013-10-15 (×5): 50 ug via INTRAVENOUS

## 2013-10-15 MED ORDER — KETOROLAC TROMETHAMINE 30 MG/ML IJ SOLN
30.0000 mg | Freq: Four times a day (QID) | INTRAMUSCULAR | Status: AC | PRN
  Administered 2013-10-15: 30 mg via INTRAMUSCULAR

## 2013-10-15 MED ORDER — OXYCODONE-ACETAMINOPHEN 5-325 MG PO TABS
1.0000 | ORAL_TABLET | ORAL | Status: DC | PRN
  Administered 2013-10-15 – 2013-10-17 (×7): 2 via ORAL
  Administered 2013-10-17: 1 via ORAL
  Administered 2013-10-17 – 2013-10-18 (×4): 2 via ORAL
  Filled 2013-10-15 (×9): qty 2
  Filled 2013-10-15: qty 1
  Filled 2013-10-15 (×2): qty 2

## 2013-10-15 MED ORDER — OXYCODONE-ACETAMINOPHEN 5-325 MG PO TABS
1.0000 | ORAL_TABLET | Freq: Four times a day (QID) | ORAL | Status: DC | PRN
  Administered 2013-10-15: 2 via ORAL
  Filled 2013-10-15: qty 2

## 2013-10-15 MED ORDER — BUPIVACAINE HCL (PF) 0.5 % IJ SOLN
INTRAMUSCULAR | Status: AC
  Filled 2013-10-15: qty 30

## 2013-10-15 MED ORDER — LIDOCAINE HCL (PF) 1 % IJ SOLN
INTRAMUSCULAR | Status: AC
  Filled 2013-10-15: qty 5

## 2013-10-15 MED ORDER — KETOROLAC TROMETHAMINE 30 MG/ML IJ SOLN: 30.0000 mg | Freq: Four times a day (QID) | INTRAMUSCULAR | Status: DC

## 2013-10-15 MED ORDER — CEFAZOLIN SODIUM-DEXTROSE 2-3 GM-% IV SOLR
INTRAVENOUS | Status: DC | PRN
  Administered 2013-10-15: 2 g via INTRAVENOUS

## 2013-10-15 MED ORDER — KETOROLAC TROMETHAMINE 30 MG/ML IJ SOLN
30.0000 mg | Freq: Four times a day (QID) | INTRAMUSCULAR | Status: AC | PRN
  Administered 2013-10-16: 30 mg via INTRAVENOUS
  Filled 2013-10-15 (×4): qty 1

## 2013-10-15 MED ORDER — KETOROLAC TROMETHAMINE 30 MG/ML IJ SOLN: 30.0000 mg | Freq: Once | INTRAMUSCULAR | Status: DC

## 2013-10-15 MED ORDER — ONDANSETRON HCL 4 MG/2ML IJ SOLN
INTRAMUSCULAR | Status: AC
  Filled 2013-10-15: qty 2

## 2013-10-15 MED ORDER — PROPOFOL 10 MG/ML IV EMUL
INTRAVENOUS | Status: AC
  Filled 2013-10-15: qty 20

## 2013-10-15 MED ORDER — DEXAMETHASONE SODIUM PHOSPHATE 4 MG/ML IJ SOLN
INTRAMUSCULAR | Status: DC | PRN
  Administered 2013-10-15: 4 mg via INTRAVENOUS

## 2013-10-15 MED ORDER — MIDAZOLAM HCL 2 MG/2ML IJ SOLN
INTRAMUSCULAR | Status: AC
  Filled 2013-10-15: qty 2

## 2013-10-15 MED ORDER — CEFAZOLIN SODIUM-DEXTROSE 2-3 GM-% IV SOLR
INTRAVENOUS | Status: AC
  Filled 2013-10-15: qty 50

## 2013-10-15 MED ORDER — PROMETHAZINE HCL 25 MG/ML IJ SOLN
12.5000 mg | INTRAMUSCULAR | Status: DC | PRN
  Administered 2013-10-15 – 2013-10-16 (×4): 12.5 mg via INTRAVENOUS
  Filled 2013-10-15 (×4): qty 1

## 2013-10-15 MED ORDER — LIDOCAINE HCL 1 % IJ SOLN
INTRAMUSCULAR | Status: DC | PRN
  Administered 2013-10-15: 50 mg via INTRADERMAL

## 2013-10-15 MED ORDER — ONDANSETRON HCL 4 MG/2ML IJ SOLN
INTRAMUSCULAR | Status: DC | PRN
  Administered 2013-10-15: 4 mg via INTRAVENOUS

## 2013-10-15 MED ORDER — ACETAMINOPHEN 325 MG PO TABS: 650.0000 mg | ORAL_TABLET | ORAL | Status: DC | PRN

## 2013-10-15 MED ORDER — FENTANYL CITRATE 0.05 MG/ML IJ SOLN
INTRAMUSCULAR | Status: AC
  Filled 2013-10-15: qty 2

## 2013-10-15 MED ORDER — HYDROMORPHONE HCL PF 1 MG/ML IJ SOLN
1.0000 mg | INTRAMUSCULAR | Status: DC | PRN
  Administered 2013-10-15 – 2013-10-16 (×9): 1 mg via INTRAVENOUS
  Filled 2013-10-15 (×9): qty 1

## 2013-10-15 MED ORDER — SODIUM CHLORIDE 0.9 % IV SOLN
INTRAVENOUS | Status: DC
  Administered 2013-10-17 (×2): via INTRAVENOUS

## 2013-10-15 MED ORDER — SODIUM CHLORIDE 0.9 % IV SOLN
INTRAVENOUS | Status: DC
  Administered 2013-10-15 (×2): via INTRAVENOUS

## 2013-10-15 MED ORDER — LACTATED RINGERS IV SOLN
INTRAVENOUS | Status: DC | PRN
  Administered 2013-10-15: 12:00:00 via INTRAVENOUS

## 2013-10-15 MED ORDER — DEXAMETHASONE SODIUM PHOSPHATE 4 MG/ML IJ SOLN
INTRAMUSCULAR | Status: AC
  Filled 2013-10-15: qty 1

## 2013-10-15 MED ORDER — PROPOFOL 10 MG/ML IV BOLUS
INTRAVENOUS | Status: DC | PRN
  Administered 2013-10-15: 160 mg via INTRAVENOUS

## 2013-10-15 MED ORDER — NEOSTIGMINE METHYLSULFATE 1 MG/ML IJ SOLN
INTRAMUSCULAR | Status: DC | PRN
Start: 2013-10-15 — End: 2013-10-15
  Administered 2013-10-15: 2 mg via INTRAVENOUS

## 2013-10-15 MED ORDER — SUCCINYLCHOLINE CHLORIDE 20 MG/ML IJ SOLN
INTRAMUSCULAR | Status: AC
  Filled 2013-10-15: qty 1

## 2013-10-15 MED ORDER — ROCURONIUM BROMIDE 100 MG/10ML IV SOLN
INTRAVENOUS | Status: DC | PRN
  Administered 2013-10-15: 25 mg via INTRAVENOUS

## 2013-10-15 MED ORDER — MIDAZOLAM HCL 5 MG/5ML IJ SOLN
INTRAMUSCULAR | Status: DC | PRN
  Administered 2013-10-15 (×3): 2 mg via INTRAVENOUS

## 2013-10-15 MED ORDER — GLYCOPYRROLATE 0.2 MG/ML IJ SOLN
INTRAMUSCULAR | Status: DC | PRN
  Administered 2013-10-15 (×2): 0.2 mg via INTRAVENOUS

## 2013-10-15 MED ORDER — 0.9 % SODIUM CHLORIDE (POUR BTL) OPTIME
TOPICAL | Status: DC | PRN
  Administered 2013-10-15: 500 mL

## 2013-10-15 SURGICAL SUPPLY — 55 items
BAG HAMPER (MISCELLANEOUS) ×4 IMPLANT
BLADE SURG SZ11 CARB STEEL (BLADE) ×4 IMPLANT
CLOSURE WOUND 1/4 X3 (GAUZE/BANDAGES/DRESSINGS) ×1
CLOTH BEACON ORANGE TIMEOUT ST (SAFETY) ×4 IMPLANT
COVER LIGHT HANDLE STERIS (MISCELLANEOUS) ×8 IMPLANT
COVER MAYO STAND XLG (DRAPE) ×4 IMPLANT
DECANTER SPIKE VIAL GLASS SM (MISCELLANEOUS) ×4 IMPLANT
DRESSING COVERLET 3X1 FLEXIBLE (GAUZE/BANDAGES/DRESSINGS) ×8 IMPLANT
DRESSING TELFA 8X3 (GAUZE/BANDAGES/DRESSINGS) IMPLANT
ELECT REM PT RETURN 9FT ADLT (ELECTROSURGICAL) ×4
ELECTRODE REM PT RTRN 9FT ADLT (ELECTROSURGICAL) ×2 IMPLANT
EVACUATOR DRAINAGE 10X20 100CC (DRAIN) IMPLANT
EVACUATOR SILICONE 100CC (DRAIN)
GLOVE ECLIPSE 9.0 STRL (GLOVE) ×4 IMPLANT
GLOVE INDICATOR 7.0 STRL GRN (GLOVE) ×4 IMPLANT
GLOVE INDICATOR STER SZ 9 (GLOVE) ×4 IMPLANT
GLOVE SS BIOGEL STRL SZ 6.5 (GLOVE) ×2 IMPLANT
GLOVE SUPERSENSE BIOGEL SZ 6.5 (GLOVE) ×2
GOWN SPEC L3 XXLG W/TWL (GOWN DISPOSABLE) ×4 IMPLANT
GOWN STRL REUS W/TWL LRG LVL3 (GOWN DISPOSABLE) ×8 IMPLANT
INST SET LAPROSCOPIC GYN AP (KITS) ×4 IMPLANT
INST SET MAJOR GENERAL (KITS) IMPLANT
KIT REMOVER STAPLE SKIN (MISCELLANEOUS) IMPLANT
KIT ROOM TURNOVER APOR (KITS) ×4 IMPLANT
MANIFOLD NEPTUNE II (INSTRUMENTS) ×4 IMPLANT
NEEDLE HYPO 25X1 1.5 SAFETY (NEEDLE) ×4 IMPLANT
NEEDLE INSUFFLATION 14GA 120MM (NEEDLE) ×4 IMPLANT
NS IRRIG 1000ML POUR BTL (IV SOLUTION) ×4 IMPLANT
PACK ABDOMINAL MAJOR (CUSTOM PROCEDURE TRAY) ×4 IMPLANT
PAD ARMBOARD 7.5X6 YLW CONV (MISCELLANEOUS) ×4 IMPLANT
SET BASIN LINEN APH (SET/KITS/TRAYS/PACK) ×4 IMPLANT
SET TUBE IRRIG SUCTION NO TIP (IRRIGATION / IRRIGATOR) ×4 IMPLANT
SOL PREP PROV IODINE SCRUB 4OZ (MISCELLANEOUS) IMPLANT
SOLUTION ANTI FOG 6CC (MISCELLANEOUS) ×4 IMPLANT
SPONGE GAUZE 4X4 12PLY (GAUZE/BANDAGES/DRESSINGS) IMPLANT
STAPLER VISISTAT 35W (STAPLE) ×4 IMPLANT
STRIP CLOSURE SKIN 1/4X3 (GAUZE/BANDAGES/DRESSINGS) ×3 IMPLANT
SUT CHROMIC 0 CT 1 (SUTURE) IMPLANT
SUT CHROMIC 2 0 CT 1 (SUTURE) IMPLANT
SUT ETHILON 3 0 FSL (SUTURE) IMPLANT
SUT PLAIN CT 1/2CIR 2-0 27IN (SUTURE) IMPLANT
SUT PROLENE 0 CT 1 30 (SUTURE) IMPLANT
SUT VIC AB 0 CT1 27 (SUTURE)
SUT VIC AB 0 CT1 27XBRD ANTBC (SUTURE) IMPLANT
SUT VIC AB 2-0 CT2 27 (SUTURE) IMPLANT
SUT VIC AB 3-0 SH 27 (SUTURE)
SUT VIC AB 3-0 SH 27X BRD (SUTURE) IMPLANT
SUT VIC AB 4-0 PS2 27 (SUTURE) ×4 IMPLANT
SUT VICRYL 0 UR6 27IN ABS (SUTURE) ×4 IMPLANT
SYR CONTROL 10ML LL (SYRINGE) ×4 IMPLANT
TRAY FOLEY CATH 16FR SILVER (SET/KITS/TRAYS/PACK) ×4 IMPLANT
TROCAR ENDO BLADELESS 11MM (ENDOMECHANICALS) ×4 IMPLANT
TROCAR XCEL NON-BLD 5MMX100MML (ENDOMECHANICALS) ×4 IMPLANT
TUBING INSUFFLATION (TUBING) ×4 IMPLANT
WARMER LAPAROSCOPE (MISCELLANEOUS) ×4 IMPLANT

## 2013-10-15 NOTE — Anesthesia Postprocedure Evaluation (Signed)
  Anesthesia Post-op Note  Patient: Catherine Ramirez  Procedure(s) Performed: Procedure(s): DIAGNOSTIC LAPAROSCOPY  (N/A)  Patient Location: PACU  Anesthesia Type:General  Level of Consciousness: awake, alert  and patient cooperative  Airway and Oxygen Therapy: Patient Spontanous Breathing and Patient connected to face mask oxygen  Post-op Pain: 4 /10, mild  Post-op Assessment: Post-op Vital signs reviewed, Patient's Cardiovascular Status Stable, Respiratory Function Stable, Patent Airway and Pain level controlled  Post-op Vital Signs: Reviewed and stable  Complications: No apparent anesthesia complications

## 2013-10-15 NOTE — Progress Notes (Signed)
Subjective: Patient reports nausea and no problems voiding.  She has continued abdominal discomfort rated an 8, but not tachycardic or BP changes. She has sharp pain with deep inspiration, likely phrenic irritation. Had very slight results from fleets enema, no spont BM,   Objective: I have reviewed patient's vital signs, intake and output, medications, labs and pathology. CBC    Component Value Date/Time   WBC 6.9 10/15/2013 0606   RBC 2.91* 10/15/2013 0606   HGB 9.3* 10/15/2013 0606   HCT 27.5* 10/15/2013 0606   PLT 150 10/15/2013 0606   MCV 94.5 10/15/2013 0606   MCH 32.0 10/15/2013 0606   MCHC 33.8 10/15/2013 0606   RDW 13.5 10/15/2013 0606   LYMPHSABS 2.4 10/12/2013 1917   MONOABS 0.4 10/12/2013 1917   EOSABS 0.1 10/12/2013 1917   BASOSABS 0.0 10/12/2013 1917   Hgb continues to gradually decline, now 9.3.   General: alert, cooperative, fatigued, no distress and pale Resp: clear to auscultation bilaterally and pain with inspiration, likely phrenic irritation Cardio: regular rate and rhythm, S1, S2 normal, no murmur, click, rub or gallop GI: normal findings: soft, non-tender and diffuse bloating., abnormal findings:  hypoactive bowel sounds and normal pitch.  and incision: clean, dry and intact Vaginal Bleeding: none   Assessment/Plan: Postop  Pelvic hematoma, s/p laparoscopic LSO for symptomatic left serous cystadenoma. Progressive anemia Phrenic irritation  Interfering with breathing comfortably  Recommend evacuation of hematoma, discussed pro and con with pt., who agrees with plan to evacuate hematoma. Nursing coordinator contacted, will  Proceed at 11:00 Patient last ate solids at 1 a.m, had one sip of apple juice at 5-6 am.      LOS: 3 days    Larisa Lanius V 10/15/2013, 8:15 AM

## 2013-10-15 NOTE — Op Note (Signed)
Operative note next Procedure diagnostic laparoscopy Indications: Suspected pelvic hematoma based on progressive decline of hemoglobin from 11.9 preop to 10.3 on readmission, then 9.6, now 9.2 with continued pain with deep inspiration while supine, and abdominal bloating, and continued abdominal pain. Pelvic ultrasound has not been done to confirm size of hematoma. Findings. No evidence of bleeding normal appearing bowel surfaces enlarged gallbladder without erythema.  Normal appearing pedicles from prior salpingectomy.    Details of procedure: Patient was taken back to the operating room, prepped and draped for abdominal procedure with low lithotomy leg support, timeout conducted, Ancef administered. The suprapubic and umbilical sites were opened by cutting out the subcuticular and fascial stitches. The umbilical lip millimeters trocar was inserted with a direct insertion technique using a blunt trocar with camera in place for direct visualization. Pneumoperitoneum was performed with 3 L CO2 infusion, and photo 1 taken revealing no evidence of blood on the surface of the omentum which was resting over the abdomen contents.. There was no evidence of trauma associated with trocar insertion. Photos number 2 and 3 showed upper quadrants, the liver and diaphragm which were normal without blood,, and no blood in the area of of the spleen or stomach. Photo #4 and 5 shows the pelvis upon placing the patient in Trendelenburg position. The pedicle for the salpingo-oophorectomy appears grossly normal with some retroperitoneal edema, essentially unchanged from postop photos . That is 6 shows the gallbladder which is large but normal without erythema. These pictures will be scanned into the record. Reinspection with photo 7 and 8 confirmed no blood in the pelvis after passage of time. At this time instilling 100 cc saline in the abdomen followed by deflation of abdomen and laparoscopic equipment removed. Fascial  closure was again performed using S. retractors and direct visualization the fascial edges for placement of the 0 Prolene sutures in the fascia Marcaine was injected around the 2 trocar sites in subcuticular 4-0 Vicryl used to close the skin incisions a, a running incision at the suprapubic site and a interrupted umbilical closure. Patient then was allowed to awaken and went to recovery in much more calm than before. She did have moderate discomfort complaints and will be treated with Toradol intravenous, and observed overnight and probable discharge midday tomorrow.

## 2013-10-15 NOTE — Brief Op Note (Signed)
10/12/2013 - 10/15/2013  12:26 PM  PATIENT:  Catherine Ramirez  28 y.o. female  PRE-OPERATIVE DIAGNOSIS:  Pelvic hematoma  POST-OPERATIVE DIAGNOSIS:   No Pelvic hematoma  PROCEDURE:  Procedure(s): DIAGNOSTIC LAPAROSCOPY  (N/A)  SURGEON:  Surgeon(s) and Role:    * [name], MD - Primary  PHYSICIAN ASSISTANT:   ASSISTANTS: WITT, CST   ANESTHESIA:   local and general  EBL:  Total I/O In: 1100 [I.V.:1100] Out: 600 [Urine:600]  BLOOD ADMINISTERED:none  DRAINS: none   LOCAL MEDICATIONS USED:  MARCAINE    and Amount: 10 ml  SPECIMEN:  No Specimen  DISPOSITION OF SPECIMEN:  N/A  COUNTS:  YES  TOURNIQUET:  * No tourniquets in log *  DICTATION: .Dragon Dictation  PLAN OF CARE: Has active inpatient admission orders in  PATIENT DISPOSITION:  PACU - hemodynamically stable.   Delay start of Pharmacological VTE agent (>24hrs) due to surgical blood loss or risk of bleeding: not applicable

## 2013-10-15 NOTE — Anesthesia Procedure Notes (Signed)
Procedure Name: Intubation Date/Time: 10/15/2013 11:28 AM Performed by: Charmaine Downs Pre-anesthesia Checklist: Patient being monitored, Suction available, Emergency Drugs available and Patient identified Patient Re-evaluated:Patient Re-evaluated prior to inductionOxygen Delivery Method: Circle system utilized Preoxygenation: Pre-oxygenation with 100% oxygen Intubation Type: IV induction, Rapid sequence and Cricoid Pressure applied Ventilation: Mask ventilation without difficulty and Oral airway inserted - appropriate to patient size Laryngoscope Size: Mac and 3 Grade View: Grade II Tube type: Oral Tube size: 7.0 mm Number of attempts: 1 Airway Equipment and Method: Stylet Placement Confirmation: ETT inserted through vocal cords under direct vision,  positive ETCO2 and breath sounds checked- equal and bilateral Secured at: 22 cm Tube secured with: Tape Dental Injury: Teeth and Oropharynx as per pre-operative assessment  Difficulty Due To: Difficulty was anticipated

## 2013-10-15 NOTE — Anesthesia Preprocedure Evaluation (Addendum)
Anesthesia Evaluation  Patient identified by MRN, date of birth, ID band Patient awake    Reviewed: Allergy & Precautions, H&P , NPO status , Patient's Chart, lab work & pertinent test results, reviewed documented beta blocker date and time   History of Anesthesia Complications (+) DIFFICULT AIRWAY and Emergence Delirium  Airway Mallampati: II TM Distance: >3 FB Neck ROM: Full    Dental  (+) Teeth Intact   Pulmonary Current Smoker,  breath sounds clear to auscultation        Cardiovascular Rhythm:Regular Rate:Normal     Neuro/Psych    GI/Hepatic   Endo/Other    Renal/GU      Musculoskeletal   Abdominal (+)  Abdomen: soft. Bowel sounds: normal.  Peds  Hematology  (+) anemia ,   Anesthesia Other Findings   Reproductive/Obstetrics                       Anesthesia Physical Anesthesia Plan  ASA: II and emergent  Anesthesia Plan: General ETT   Post-op Pain Management:    Induction: Intravenous, Rapid sequence and Cricoid pressure planned  Airway Management Planned: Oral ETT  Additional Equipment:   Intra-op Plan:   Post-operative Plan: Extubation in OR  Informed Consent:   Plan Discussed with: CRNA and Anesthesiologist  Anesthesia Plan Comments:        Anesthesia Quick Evaluation

## 2013-10-15 NOTE — Anesthesia Postprocedure Evaluation (Signed)
  Anesthesia Post-op Note  Patient: Catherine Ramirez  Procedure(s) Performed: Procedure(s): DIAGNOSTIC LAPAROSCOPY  (N/A)  Patient Location: PACU  Anesthesia Type:General  Level of Consciousness: awake, alert , oriented and patient cooperative  Airway and Oxygen Therapy: Patient Spontanous Breathing  Post-op Pain: 4 /10, moderate  Post-op Assessment: Post-op Vital signs reviewed, Patient's Cardiovascular Status Stable, Respiratory Function Stable, Patent Airway, No signs of Nausea or vomiting and Pain level controlled  Post-op Vital Signs: Reviewed and stable  Complications: No apparent anesthesia complications

## 2013-10-15 NOTE — Transfer of Care (Signed)
Immediate Anesthesia Transfer of Care Note  Patient: Catherine Ramirez  Procedure(s) Performed: Procedure(s): DIAGNOSTIC LAPAROSCOPY  (N/A)  Patient Location: PACU  Anesthesia Type:General  Level of Consciousness: awake, alert  and patient cooperative  Airway & Oxygen Therapy: Patient Spontanous Breathing and Patient connected to face mask oxygen  Post-op Assessment: Report given to PACU RN, Post -op Vital signs reviewed and stable and Patient moving all extremities  Post vital signs: Reviewed and stable  Complications: No apparent anesthesia complications

## 2013-10-16 ENCOUNTER — Inpatient Hospital Stay (HOSPITAL_COMMUNITY): Payer: Medicaid Other

## 2013-10-16 LAB — BASIC METABOLIC PANEL WITH GFR
BUN: 9 mg/dL (ref 6–23)
CO2: 28 meq/L (ref 19–32)
Calcium: 8.8 mg/dL (ref 8.4–10.5)
Chloride: 106 meq/L (ref 96–112)
Creatinine, Ser: 0.71 mg/dL (ref 0.50–1.10)
GFR calc Af Amer: >90 mL/min (ref >90)
GFR calc non Af Amer: >90 mL/min (ref >90)
Glucose, Bld: 99 mg/dL (ref 70–99)
Potassium: 4.1 meq/L (ref 3.7–5.3)
Sodium: 142 meq/L (ref 137–147)

## 2013-10-16 LAB — CBC WITH DIFFERENTIAL/PLATELET
BASOS ABS: 0 10*3/uL (ref 0.0–0.1)
BASOS PCT: 0 % (ref 0–1)
Eosinophils Absolute: 0 10*3/uL (ref 0.0–0.7)
Eosinophils Relative: 0 % (ref 0–5)
HCT: 30.3 % — ABNORMAL LOW (ref 36.0–46.0)
Hemoglobin: 10 g/dL — ABNORMAL LOW (ref 12.0–15.0)
Lymphocytes Relative: 23 % (ref 12–46)
Lymphs Abs: 2.7 10*3/uL (ref 0.7–4.0)
MCH: 31.1 pg (ref 26.0–34.0)
MCHC: 33 g/dL (ref 30.0–36.0)
MCV: 94.1 fL (ref 78.0–100.0)
MONO ABS: 0.7 10*3/uL (ref 0.1–1.0)
Monocytes Relative: 6 % (ref 3–12)
NEUTROS ABS: 7.9 10*3/uL — AB (ref 1.7–7.7)
Neutrophils Relative %: 70 % (ref 43–77)
PLATELETS: 201 10*3/uL (ref 150–400)
RBC: 3.22 MIL/uL — ABNORMAL LOW (ref 3.87–5.11)
RDW: 13.5 % (ref 11.5–15.5)
WBC: 11.3 10*3/uL — ABNORMAL HIGH (ref 4.0–10.5)

## 2013-10-16 LAB — HEPATIC FUNCTION PANEL
ALBUMIN: 2.8 g/dL — AB (ref 3.5–5.2)
ALK PHOS: 56 U/L (ref 39–117)
ALT: 12 U/L (ref 0–35)
AST: 15 U/L (ref 0–37)
Bilirubin, Direct: <0.2 mg/dL (ref 0.0–0.3)
TOTAL PROTEIN: 6.6 g/dL (ref 6.0–8.3)
Total Bilirubin: 0.2 mg/dL — ABNORMAL LOW (ref 0.3–1.2)

## 2013-10-16 LAB — AMYLASE: Amylase: 34 U/L (ref 0–105)

## 2013-10-16 MED ORDER — SODIUM CHLORIDE 0.9 % IJ SOLN: 9.0000 mL | INTRAMUSCULAR | Status: DC | PRN

## 2013-10-16 MED ORDER — KETOROLAC TROMETHAMINE 30 MG/ML IJ SOLN
30.0000 mg | Freq: Four times a day (QID) | INTRAMUSCULAR | Status: DC
  Administered 2013-10-16 – 2013-10-18 (×7): 30 mg via INTRAVENOUS
  Filled 2013-10-16 (×7): qty 1

## 2013-10-16 MED ORDER — NALOXONE HCL 0.4 MG/ML IJ SOLN: 0.4000 mg | INTRAMUSCULAR | Status: DC | PRN

## 2013-10-16 MED ORDER — DIPHENHYDRAMINE HCL 50 MG/ML IJ SOLN: 12.5000 mg | Freq: Four times a day (QID) | INTRAMUSCULAR | Status: DC | PRN

## 2013-10-16 MED ORDER — HYDROMORPHONE 0.3 MG/ML IV SOLN
INTRAVENOUS | Status: DC
  Administered 2013-10-16 (×2): via INTRAVENOUS
  Administered 2013-10-17: 1.2 mg via INTRAVENOUS
  Administered 2013-10-17: 09:00:00 via INTRAVENOUS
  Administered 2013-10-17: 3 mg via INTRAVENOUS
  Administered 2013-10-17: 1.2 mg via INTRAVENOUS
  Administered 2013-10-17: 0.3 mg via INTRAVENOUS
  Administered 2013-10-18: 1.5 mg via INTRAVENOUS
  Administered 2013-10-18: 0.9 mg via INTRAVENOUS
  Administered 2013-10-18: 08:00:00 via INTRAVENOUS
  Filled 2013-10-16 (×4): qty 25

## 2013-10-16 MED ORDER — DIPHENHYDRAMINE HCL 12.5 MG/5ML PO ELIX: 12.5000 mg | ORAL_SOLUTION | Freq: Four times a day (QID) | ORAL | Status: DC | PRN

## 2013-10-16 MED ORDER — PROMETHAZINE HCL 25 MG/ML IJ SOLN
12.5000 mg | INTRAMUSCULAR | Status: DC | PRN
  Administered 2013-10-16: 12.5 mg via INTRAVENOUS
  Administered 2013-10-17 – 2013-10-18 (×3): 25 mg via INTRAVENOUS
  Filled 2013-10-16 (×4): qty 1

## 2013-10-16 MED ORDER — POLYETHYLENE GLYCOL 3350 17 G PO PACK
17.0000 g | PACK | Freq: Two times a day (BID) | ORAL | Status: DC
  Administered 2013-10-16 – 2013-10-18 (×4): 17 g via ORAL
  Filled 2013-10-16 (×4): qty 1

## 2013-10-16 NOTE — Progress Notes (Signed)
Called by Care nurse re: pt's high pain med utilization , and precise monitoring of available pain meds. Pt is negotiating for increasing Dilaudid to 2 mg instead of 1 mg, and I feel this is excessive. Pt meds will be changed as follows    1. Switch to standard dose dilaudid PCA,      2. Discontinue IV 1 mg dilaudid    3. Adjust PHenergan to 12-25 mg iv q 4h prn nausea,  And this will provide potentiation of Dilaudid pca   4. Resume Toradol 30 mg iv q6h.  Will rechk CBC daily.

## 2013-10-16 NOTE — Progress Notes (Signed)
Pt complains that she is in so much pain this morning because she did not get her pain medication that she called for at midnight. Pt was noted to be sleeping at the 1 am hour with no signs of distress.

## 2013-10-16 NOTE — Consult Note (Addendum)
Incorrect chart documentation, transferred to the appropriate chart, MRN [id number]

## 2013-10-16 NOTE — Progress Notes (Signed)
1 Day Post-Op Procedure(s) (LRB): DIAGNOSTIC LAPAROSCOPY  (N/A) for suspected post-salpingoophorectomy pelvic hematoma.    Subjective: Patient reports that the pain is now noted with breathing, particularly in RUQ, radiating in to back on right. Pain in RUQ is now worse than rest of abdomen..    Objective: I have reviewed patient's vital signs, intake and output and labs are pending, cbc, bmet.  General: alert, cooperative, mild distress and moderate distress Resp: clear to auscultation bilaterally GI: normal findings: bowel sounds normal, abnormal findings:  marked tenderness in the RUQ and to lesser degree in lower abdomen and incision: clean, dry and intact Extremities: extremities normal, atraumatic, no cyanosis or edema Vaginal Bleeding: none  Assessment: s/p Procedure(s): DIAGNOSTIC LAPAROSCOPY  (N/A): now with symptoms of possible biliary colic.  Plan: Clear liquids CBC, BMET, Liver panel with amylase, and ultrasound abdomen in am.  LOS: 4 days    Oliana Gowens V 10/16/2013, 9:56 AM

## 2013-10-17 ENCOUNTER — Encounter: Payer: Self-pay | Admitting: Obstetrics and Gynecology

## 2013-10-17 ENCOUNTER — Ambulatory Visit: Payer: Medicaid Other | Admitting: Obstetrics and Gynecology

## 2013-10-17 ENCOUNTER — Encounter: Payer: Self-pay | Admitting: *Deleted

## 2013-10-17 LAB — CBC WITH DIFFERENTIAL/PLATELET
BASOS ABS: 0 10*3/uL (ref 0.0–0.1)
BASOS PCT: 0 % (ref 0–1)
Eosinophils Absolute: 0.1 10*3/uL (ref 0.0–0.7)
Eosinophils Relative: 1 % (ref 0–5)
HCT: 28.2 % — ABNORMAL LOW (ref 36.0–46.0)
HEMOGLOBIN: 9.3 g/dL — AB (ref 12.0–15.0)
Lymphocytes Relative: 38 % (ref 12–46)
Lymphs Abs: 3.6 10*3/uL (ref 0.7–4.0)
MCH: 31.4 pg (ref 26.0–34.0)
MCHC: 33 g/dL (ref 30.0–36.0)
MCV: 95.3 fL (ref 78.0–100.0)
Monocytes Absolute: 0.4 10*3/uL (ref 0.1–1.0)
Monocytes Relative: 5 % (ref 3–12)
NEUTROS ABS: 5.4 10*3/uL (ref 1.7–7.7)
Neutrophils Relative %: 57 % (ref 43–77)
Platelets: 177 10*3/uL (ref 150–400)
RBC: 2.96 MIL/uL — ABNORMAL LOW (ref 3.87–5.11)
RDW: 13.7 % (ref 11.5–15.5)
WBC: 9.5 10*3/uL (ref 4.0–10.5)

## 2013-10-17 MED ORDER — PEG 3350-KCL-NA BICARB-NACL 420 G PO SOLR
2000.0000 mL | Freq: Once | ORAL | Status: AC
  Administered 2013-10-17: 2000 mL via ORAL
  Filled 2013-10-17: qty 4000

## 2013-10-17 MED ORDER — MAGNESIUM CITRATE PO SOLN
1.0000 | Freq: Once | ORAL | Status: AC
  Administered 2013-10-17: 1 via ORAL
  Filled 2013-10-17: qty 296

## 2013-10-17 MED ORDER — MAGNESIUM CITRATE PO SOLN
300.0000 mL | Freq: Once | ORAL | Status: DC
Start: 2013-10-17 — End: 2013-10-17

## 2013-10-17 NOTE — Anesthesia Postprocedure Evaluation (Signed)
  Anesthesia Post-op Note  Patient: Catherine Ramirez  Procedure(s) Performed: Procedure(s): DIAGNOSTIC LAPAROSCOPY  (N/A)  Patient Location: PACU  Anesthesia Type:General  Level of Consciousness: awake, alert , oriented and patient cooperative  Airway and Oxygen Therapy: Patient Spontanous Breathing and Patient connected to nasal cannula oxygen  Post-op Pain: mild  Post-op Assessment: Post-op Vital signs reviewed, Patient's Cardiovascular Status Stable, Respiratory Function Stable, Patent Airway, No signs of Nausea or vomiting and Pain level controlled  Post-op Vital Signs: Reviewed and stable  Complications: No apparent anesthesia complications

## 2013-10-17 NOTE — Progress Notes (Signed)
Pt states she is much more comfortable at this time.

## 2013-10-17 NOTE — Addendum Note (Signed)
Addendum created 10/17/13 1421 by Mickel Baas, CRNA   Modules edited: Notes Section   Notes Section:  File: [id number]

## 2013-10-18 ENCOUNTER — Encounter (HOSPITAL_COMMUNITY): Payer: Self-pay | Admitting: Obstetrics and Gynecology

## 2013-10-18 DIAGNOSIS — K5909 Other constipation: Secondary | ICD-10-CM

## 2013-10-18 MED ORDER — POLYETHYLENE GLYCOL 3350 17 G PO PACK: 17.0000 g | PACK | Freq: Two times a day (BID) | ORAL | Status: DC

## 2013-10-18 MED ORDER — PNEUMOCOCCAL VAC POLYVALENT 25 MCG/0.5ML IJ INJ
0.5000 mL | INJECTION | INTRAMUSCULAR | Status: AC | PRN
  Administered 2013-10-18: 0.5 mL via INTRAMUSCULAR
  Filled 2013-10-18: qty 0.5

## 2013-10-18 MED ORDER — OXYCODONE-ACETAMINOPHEN 5-325 MG PO TABS: 1.0000 | ORAL_TABLET | ORAL | Status: DC | PRN

## 2013-10-18 MED ORDER — LINACLOTIDE 145 MCG PO CAPS: 145.0000 ug | ORAL_CAPSULE | Freq: Every day | ORAL | Status: DC

## 2013-10-18 NOTE — Discharge Summary (Signed)
Physician Discharge Summary  Patient ID: Catherine Ramirez MRN: [id number] DOB/AGE: [birth year] 28 y.o.  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 The patient had been dramatically uncomfortable on presentation 10/10/2012 underwent laparoscopy 10/11/2012, with postoperative recovery room discomfort greater than normal, despite uneventful surgery, the patient eventually went home on Phenergan and oral analgesics before returning 1 day postop to the office with normal bowel sounds, significant pain complaints she was admitted for pain management serial exams. She was known to have gallstones Admission laboratory evaluation included hemoglobin 10.2 white count 8800 BUN 15 creatinine 0.72, unchanged from preop. Hemoglobin was a decrease from preoperative level 0.8 hemoglobin, raising question of some postoperative bleeding. This was thought to be a possibility given the patient was initially severely agitated postoperatively and had to be restrained keep her from moving off the operative exam table and an uncontrolled fashion. The patient was examined serially had hemoglobin change from 10.2-10.3, then 9.9, and 9.3 on 10/15/2013 possibly representing dilution give her hydration felt possibly to represent intra-abdominal bleeding. Admission history that the patient had been without a bowel movement x11 days, and the patient experiences  chronic constipation usually coming every third or fourth day for this which were described as hard and firm and small. Stool softeners, enemas were attempted. Hemoglobin dropped to 9.3 decision was made to proceed with laparoscopy given the patient's discomfort, abdominal fullness, and concerned with confirmation of intra-abdominal bleeding and chronic constipation might lead to bowel obstruction. Laparoscopy 3/14 revealed an essentially normal postoperative pelvic appearance with a small amount of transudate in the pelvis. No significant intra-abdominal bleeding occurred. The gallbladder appeared Large and without erythema Postoperative management was less dramatic than the previous surgery but challenging due to patient's dramatic pain. She was placed on PCA pump as well as given MiraLAX 34 g daily along with fleets enema. The patient had minimal response. On 3/15 the patient had dramatic right upper quadrant pain. Gallbladder ultrasound 3/16 she biliary dilatation, and the stones remained present without evidence of obstruction. She remained afebrile, on 316 aggressive efforts were made to encourage return of bowel activity. She was given MiraLAX, magnesium citrate 300 mL, and then begun on GoLYTELY at 8 PM. The patient refused to take the GoLYTELY as directed, but still has 2 small bowel movements and improved discomfort which she still related and 6 when questioned. Abdominal exam showed active bowel sounds, no guarding no rebound and patient was considered safe for continued outpatient care as bowel function is provoked to try to achieve every other day defecation.  condition at discharge stable followup 2 weeks [facility]  discharge medications   Consults: None  Significant Diagnostic Studies: labs:  CBC    Component Value Date/Time   WBC 9.5 10/17/2013 0458   RBC 2.96* 10/17/2013 0458   HGB 9.3* 10/17/2013 0458   HCT 28.2* 10/17/2013 0458   PLT 177 10/17/2013 0458   MCV 95.3 10/17/2013  0458   MCH 31.4 10/17/2013 0458   MCHC 33.0 10/17/2013 0458  RDW 13.7 10/17/2013 0458   LYMPHSABS 3.6 10/17/2013 0458   MONOABS 0.4 10/17/2013 0458   EOSABS 0.1 10/17/2013 0458   BASOSABS 0.0 10/17/2013 0458       Treatments: IV hydration, surgery: Diagnostic laparoscopy 10/15/13 and bowel stimulation with MiraLAX, and mag citrate and GoLYTELY  Discharge Exam: Blood pressure 96/62, pulse 71, temperature 98.2 F (36.8 C), temperature source Oral, resp. rate 11, height 5\' 6"  (1.676 m), weight 144 lb 13.5 oz (65.7 kg), last menstrual period 09/12/2013, SpO2 97.00%. General appearance: alert, cooperative, no distress and Agreeable to going home Head: Normocephalic, without obvious abnormality, atraumatic Neck: no adenopathy, no carotid bruit, no JVD, supple, symmetrical, trachea midline and thyroid not enlarged, symmetric, no tenderness/mass/nodules Resp: clear to auscultation bilaterally GI: soft, non-tender; bowel sounds normal; no masses,  no organomegaly and Slight abdominal fullness, felt due to incomplete evacuation of Pelvic: external genitalia normal  Disposition: 01-Home or Self Care  Discharge Orders   Future Orders Complete By Expires   Call MD for:  persistant nausea and vomiting  As directed    Call MD for:  redness, tenderness, or signs of infection (pain, swelling, redness, odor or green/yellow discharge around incision site)  As directed    Call MD for:  temperature >100.4  As directed    Diet - low sodium heart healthy  As directed    Discharge instructions  As directed    Comments:     Continue Miralax 17 grams of powder taken twice daily for constipation prevention Use the toradol for pain control Try to reduce the use of opiate pain meds as they make your chronic constipation worse. Add Linzess to your meds to improve bowel function and softness of bowel movements   Increase activity slowly  As directed    Remove dressing in 24 hours  As directed    Comments:      You may shower.   Sexual Activity Restrictions  As directed    Comments:     Avoid x 2 weeks       Medication List    STOP taking these medications       ibuprofen 200 MG tablet  Commonly known as:  ADVIL,MOTRIN     naproxen 500 MG tablet  Commonly known as:  NAPROSYN      TAKE these medications       diclofenac sodium 1 % Gel  Commonly known as:  VOLTAREN  Apply 1 application topically daily as needed. For pain     EPIPEN 0.3 mg/0.3 mL Devi  Generic drug:  EPINEPHrine  Inject 0.3 mg into the muscle once.     ketorolac 10 MG tablet  Commonly known as:  TORADOL  Take 10 mg by mouth every 6 (six) hours as needed. For no longer than 5 days     Linaclotide 145 MCG Caps capsule  Commonly known as:  LINZESS  Take 1 capsule (145 mcg total) by mouth daily.      ASK your doctor about these medications       ondansetron 8 MG disintegrating tablet  Commonly known as:  ZOFRAN ODT  Take 1 tablet (8 mg total) by mouth every 8 (eight) hours as needed for nausea.     oxyCODONE-acetaminophen 5-325 MG per tablet  Commonly known as:  PERCOCET/ROXICET  Take 1 tablet by mouth every 4 (four) hours as needed.  Ask about: Which instructions should I use?     oxyCODONE-acetaminophen 5-325 MG per tablet  Commonly known  as:  ROXICET  Take 1 tablet by mouth every 4 (four) hours as needed for severe pain.  Ask about: Which instructions should I use?     zolpidem 10 MG tablet  Commonly known as:  AMBIEN  Take 10 mg by mouth at bedtime as needed.           Follow-up Information   Follow up In 2 weeks. (As needed, If symptoms worsen)       SignedJonnie Kind 10/18/2013, 10:20 AM

## 2013-10-18 NOTE — Care Management Note (Signed)
    Page 1 of 1   10/18/2013     2:01:42 PM   CARE MANAGEMENT NOTE 10/18/2013  Patient:  Catherine Ramirez, Catherine Ramirez   Account Number:  [id number]  Date Initiated:  10/18/2013  Documentation initiated by:  Theophilus Kinds  Subjective/Objective Assessment:   Pt admitted from home with post op pain. Pt lives with significant other and will retrurn home at [place]. Pt is independent with ADL's.     Action/Plan:   No CM need snoted.   Anticipated DC Date:  10/18/2013   Anticipated DC Plan:  [place]  CM consult      Choice offered to / List presented to:             Status of service:  Completed, signed off Medicare Important Message given?   (If response is "NO", the following Medicare IM given date fields will be blank) Date Medicare IM given:   Date Additional Medicare IM given:    Discharge Disposition:  HOME/SELF CARE  Per UR Regulation:    If discussed at Long Length of Stay Meetings, dates discussed:   10/18/2013    Comments:  10/18/13 [place], RN BSNCM

## 2013-10-18 NOTE — Progress Notes (Signed)
Discharge instructions and prescriptions given, verbalized understanding, out in stable condition via w/c with staff. 

## 2013-10-18 NOTE — Progress Notes (Signed)
Patient has had two bowel movements throughout the night after drinking a small amount of Golytely per MD order. Patient has been encouraged to drink the Golytely every hour and has stated that "she just can't drink to much of it". Will continue to encourage the patient to drink the Golytely at this time.

## 2013-10-18 NOTE — Discharge Instructions (Signed)
Constipation Constipation, Adult Constipation is when a person has fewer than 3 bowel movements a week; has difficulty having a bowel movement; or has stools that are dry, hard, or larger than normal. As people grow older, constipation is more common. If you try to fix constipation with medicines that make you have a bowel movement (laxatives), the problem may get worse. Long-term laxative use may cause the muscles of the colon to become weak. A low-fiber diet, not taking in enough fluids, and taking certain medicines may make constipation worse. CAUSES   Certain medicines, such as antidepressants, pain medicine, iron supplements, antacids, and water pills.   Certain diseases, such as diabetes, irritable bowel syndrome (IBS), thyroid disease, or depression.   Not drinking enough water.   Not eating enough fiber-rich foods.   Stress or travel.  Lack of physical activity or exercise.  Not going to the restroom when there is the urge to have a bowel movement.  Ignoring the urge to have a bowel movement.  Using laxatives too much. SYMPTOMS   Having fewer than 3 bowel movements a week.   Straining to have a bowel movement.   Having hard, dry, or larger than normal stools.   Feeling full or bloated.   Pain in the lower abdomen.  Not feeling relief after having a bowel movement. DIAGNOSIS  Your caregiver will take a medical history and perform a physical exam. Further testing may be done for severe constipation. Some tests may include:   A barium enema X-ray to examine your rectum, colon, and sometimes, your small intestine.  A sigmoidoscopy to examine your lower colon.  A colonoscopy to examine your entire colon. TREATMENT  Treatment will depend on the severity of your constipation and what is causing it. Some dietary treatments include drinking more fluids and eating more fiber-rich foods. Lifestyle treatments may include regular exercise. If these diet and lifestyle  recommendations do not help, your caregiver may recommend taking over-the-counter laxative medicines to help you have bowel movements. Prescription medicines may be prescribed if over-the-counter medicines do not work.  HOME CARE INSTRUCTIONS   Increase dietary fiber in your diet, such as fruits, vegetables, whole grains, and beans. Limit high-fat and processed sugars in your diet, such as [place] fries, hamburgers, cookies, candies, and soda.   A fiber supplement may be added to your diet if you cannot get enough fiber from foods.   Drink enough fluids to keep your urine clear or pale yellow.   Exercise regularly or as directed by your caregiver.   Go to the restroom when you have the urge to go. Do not hold it.  Only take medicines as directed by your caregiver. Do not take other medicines for constipation without talking to your caregiver first. [place] IF:   You have bright red blood in your stool.   Your constipation lasts for more than 4 days or gets worse.   You have abdominal or rectal pain.   You have thin, pencil-like stools.  You have unexplained weight loss. MAKE SURE YOU:   Understand these instructions.  Will watch your condition.  Will get help right away if you are not doing well or get worse. Document Released: 04/18/2004 Document Revised: 10/13/2011 Document Reviewed: 05/02/2013 [facility] Patient Information 2014 [place], [place].

## 2013-10-18 NOTE — Progress Notes (Signed)
3 Days Post-Op Procedure(s) (LRB): DIAGNOSTIC LAPAROSCOPY  (N/A), and 7 days s/p laparoscopic left salpingoophorectomy and right salpingectomy for serous cystadenoma, Currently focusing on pain management and controlling chronic constipation.  Subjective: Patient reports 2 moderate bowel movements in response to Miralax 34gm, plus Magnesium Citrate 300 ml, followed at 8 pm by beginning GoLytely 8 oz every 30 minutes, but patient would not drink the medicine as requested. She did take 3 glasses overnight, and will continue this a.m.   Pt has diffuse complaints of skin sensitivity over anterior chest, shoulders.  Objective: I have reviewed patient's vital signs, intake and output, medications and labs.  General: alert, cooperative and no distress Resp: clear to auscultation bilaterally Cardio: regular rate and rhythm GI: soft, non-tender; bowel sounds normal; no masses,  no organomegaly and incision: clean, dry and intact Extremities: edema trace and Homans sign is negative, no sign of DVT  Assessment: s/p Procedure(s): DIAGNOSTIC LAPAROSCOPY  (N/A): stable, tolerating diet and chronic constipation slowly responding to Magnesium Citrate and propylene glycol  Plan: Advance diet Encourage ambulation Discontinue IV fluids Discharge home /begin on linzess, as well as Miralax for chronic constipation.  LOS: 6 days    Natash Berman V 10/18/2013, 10:01 AM

## 2013-10-19 NOTE — Progress Notes (Signed)
UR chart review completed.  

## 2013-11-02 ENCOUNTER — Encounter: Payer: Self-pay | Admitting: Obstetrics and Gynecology

## 2013-11-02 ENCOUNTER — Ambulatory Visit (INDEPENDENT_AMBULATORY_CARE_PROVIDER_SITE_OTHER): Payer: Medicaid Other | Admitting: Obstetrics and Gynecology

## 2013-11-02 VITALS — BP 112/66 | Ht 65.0 in | Wt 140.0 lb

## 2013-11-02 DIAGNOSIS — Z9889 Other specified postprocedural states: Secondary | ICD-10-CM | POA: Insufficient documentation

## 2013-11-02 NOTE — Progress Notes (Signed)
This chart was scribed by [name], Medical Scribe, for Dr. Mallory Shirk on 11/02/13 at 2:45 PM. This chart was reviewed by Dr. Mallory Shirk and is accurate.  Subjective:  Catherine Ramirez is a 28 y.o. female who presents to the clinic 3 weeks status post DIAGNOSTIC LAPAROSCOPY for suspected pelvic hematoma after prior left oophorectomy and bilateral salpingectomy pain.   Review of Systems Negative except upper abdominal pain with sexual intercourse   She has been eating a regular diet without difficulty.   Bowel movements are normal on 8 mg Linzless. Reports no BM in 4 days. The patient is not having any pain. except for during intercourse   PCP is [facility]  Objective:  BP 112/66  Ht 5\' 5"  (1.651 m)  Wt 140 lb (63.504 kg)  BMI 23.30 kg/m2  LMP 09/12/2013 Chaperone present for exam which was performed with pt's permission. General:Well developed, well nourished.  No acute distress. Abdomen: Bowel sounds normal, soft, non-tender. Pelvic Exam:     External Genitalia:  Normal    Internal Genitalia: Deferred  Incision(s):   Healing well, no drainage, no erythema, no hernia, no swelling, no dehiscence, incision well approximated.   Assessment:  Post-Op 3 weeks s/p DIAGNOSTIC LAPAROSCOPY for suspected pelvic hematoma after prior left oophorectomy and bilateral salpingectomy pain. Doing well postoperatively.   Plan:  1.Wound care discussed   2. .Continue any current medications. 3. Activity restrictions: none 4. return to work: not applicable. 5. Follow up PRN. PAP in 3 years

## 2013-11-08 ENCOUNTER — Ambulatory Visit (HOSPITAL_COMMUNITY)
Admission: RE | Admit: 2013-11-08 | Discharge: 2013-11-08 | Disposition: A | Discharge status: Home / Self Care | Payer: Medicaid Other | Source: Ambulatory Visit | Attending: General Surgery | Admitting: General Surgery

## 2013-11-08 ENCOUNTER — Other Ambulatory Visit (HOSPITAL_COMMUNITY): Payer: Self-pay | Admitting: General Surgery

## 2013-11-08 DIAGNOSIS — K8020 Calculus of gallbladder without cholecystitis without obstruction: Secondary | ICD-10-CM | POA: Insufficient documentation

## 2013-11-09 ENCOUNTER — Encounter (HOSPITAL_COMMUNITY)
Admission: RE | Admit: 2013-11-09 | Discharge: 2013-11-09 | Disposition: A | Discharge status: Home / Self Care | Payer: Medicaid Other | Source: Ambulatory Visit | Attending: General Surgery | Admitting: General Surgery

## 2013-11-09 ENCOUNTER — Encounter (HOSPITAL_COMMUNITY): Payer: Self-pay | Admitting: Pharmacy Technician

## 2013-11-09 NOTE — Pre-Procedure Instructions (Signed)
Dr [name] aware of H&H. Repeated in ofice, Vaughan Basta will send results with consent and orders.

## 2013-11-10 ENCOUNTER — Ambulatory Visit (HOSPITAL_COMMUNITY): Payer: Medicaid Other | Admitting: Anesthesiology

## 2013-11-10 ENCOUNTER — Inpatient Hospital Stay (HOSPITAL_COMMUNITY)
Admission: RE | Admit: 2013-11-10 | Discharge: 2013-11-14 | DRG: 419 | Disposition: A | Discharge status: Home / Self Care | Payer: Medicaid Other | Source: Ambulatory Visit | Attending: General Surgery | Admitting: General Surgery

## 2013-11-10 ENCOUNTER — Encounter (HOSPITAL_COMMUNITY): Payer: Self-pay | Admitting: *Deleted

## 2013-11-10 ENCOUNTER — Encounter (HOSPITAL_COMMUNITY): Payer: Medicaid Other | Admitting: Anesthesiology

## 2013-11-10 ENCOUNTER — Encounter (HOSPITAL_COMMUNITY)
Admission: RE | Disposition: A | Discharge status: Home / Self Care | Payer: Self-pay | Source: Ambulatory Visit | Attending: General Surgery

## 2013-11-10 DIAGNOSIS — K589 Irritable bowel syndrome without diarrhea: Secondary | ICD-10-CM | POA: Diagnosis present

## 2013-11-10 DIAGNOSIS — K8000 Calculus of gallbladder with acute cholecystitis without obstruction: Secondary | ICD-10-CM | POA: Diagnosis present

## 2013-11-10 DIAGNOSIS — K8010 Calculus of gallbladder with chronic cholecystitis without obstruction: Principal | ICD-10-CM | POA: Diagnosis present

## 2013-11-10 DIAGNOSIS — T4275XA Adverse effect of unspecified antiepileptic and sedative-hypnotic drugs, initial encounter: Secondary | ICD-10-CM | POA: Diagnosis not present

## 2013-11-10 DIAGNOSIS — Z880 Allergy status to penicillin: Secondary | ICD-10-CM

## 2013-11-10 DIAGNOSIS — G894 Chronic pain syndrome: Secondary | ICD-10-CM | POA: Diagnosis present

## 2013-11-10 DIAGNOSIS — K5900 Constipation, unspecified: Secondary | ICD-10-CM | POA: Diagnosis not present

## 2013-11-10 HISTORY — DX: Chronic pain syndrome: G89.4

## 2013-11-10 HISTORY — PX: CHOLECYSTECTOMY: SHX55

## 2013-11-10 LAB — HCG, SERUM, QUALITATIVE: Preg, Serum: NEGATIVE

## 2013-11-10 SURGERY — LAPAROSCOPIC CHOLECYSTECTOMY
Anesthesia: General | Site: Abdomen

## 2013-11-10 MED ORDER — LINACLOTIDE 145 MCG PO CAPS
145.0000 ug | ORAL_CAPSULE | Freq: Every day | ORAL | Status: DC
  Administered 2013-11-10 – 2013-11-14 (×5): 145 ug via ORAL
  Filled 2013-11-10 (×7): qty 1

## 2013-11-10 MED ORDER — BUPIVACAINE HCL (PF) 0.5 % IJ SOLN
INTRAMUSCULAR | Status: AC
  Filled 2013-11-10: qty 30

## 2013-11-10 MED ORDER — ROCURONIUM BROMIDE 50 MG/5ML IV SOLN
INTRAVENOUS | Status: AC
  Filled 2013-11-10: qty 1

## 2013-11-10 MED ORDER — KETOROLAC TROMETHAMINE 30 MG/ML IJ SOLN
15.0000 mg | Freq: Four times a day (QID) | INTRAMUSCULAR | Status: DC | PRN
  Administered 2013-11-10 – 2013-11-14 (×12): 15 mg via INTRAVENOUS
  Filled 2013-11-10 (×12): qty 1

## 2013-11-10 MED ORDER — GLYCOPYRROLATE 0.2 MG/ML IJ SOLN
INTRAMUSCULAR | Status: AC
  Filled 2013-11-10: qty 1

## 2013-11-10 MED ORDER — LIDOCAINE HCL (PF) 1 % IJ SOLN
INTRAMUSCULAR | Status: AC
  Filled 2013-11-10: qty 5

## 2013-11-10 MED ORDER — NEOSTIGMINE METHYLSULFATE 1 MG/ML IJ SOLN
INTRAMUSCULAR | Status: DC | PRN
  Administered 2013-11-10: 1 mg via INTRAVENOUS
  Administered 2013-11-10: 2 mg via INTRAVENOUS

## 2013-11-10 MED ORDER — DIPHENHYDRAMINE HCL 12.5 MG/5ML PO ELIX: 12.5000 mg | ORAL_SOLUTION | Freq: Four times a day (QID) | ORAL | Status: DC | PRN

## 2013-11-10 MED ORDER — ONDANSETRON HCL 4 MG/2ML IJ SOLN
INTRAMUSCULAR | Status: AC
  Filled 2013-11-10: qty 2

## 2013-11-10 MED ORDER — DIPHENHYDRAMINE HCL 50 MG/ML IJ SOLN: 12.5000 mg | Freq: Four times a day (QID) | INTRAMUSCULAR | Status: DC | PRN

## 2013-11-10 MED ORDER — WATER FOR IRRIGATION, STERILE IR SOLN
Status: DC | PRN
  Administered 2013-11-10: 2000 mL

## 2013-11-10 MED ORDER — HYDROMORPHONE HCL PF 1 MG/ML IJ SOLN
0.5000 mg | INTRAMUSCULAR | Status: DC | PRN
  Administered 2013-11-10: 1 mg via INTRAVENOUS

## 2013-11-10 MED ORDER — SODIUM CHLORIDE 0.9 % IJ SOLN
INTRAMUSCULAR | Status: AC
  Filled 2013-11-10: qty 10

## 2013-11-10 MED ORDER — CIPROFLOXACIN IN D5W 400 MG/200ML IV SOLN
INTRAVENOUS | Status: AC
  Filled 2013-11-10: qty 200

## 2013-11-10 MED ORDER — POLYETHYLENE GLYCOL 3350 17 G PO PACK
17.0000 g | PACK | Freq: Every day | ORAL | Status: DC
  Administered 2013-11-10 – 2013-11-14 (×5): 17 g via ORAL
  Filled 2013-11-10 (×5): qty 1

## 2013-11-10 MED ORDER — LORAZEPAM 0.5 MG PO TABS
0.5000 mg | ORAL_TABLET | Freq: Every day | ORAL | Status: DC
  Administered 2013-11-10 – 2013-11-13 (×4): 0.5 mg via ORAL
  Filled 2013-11-10 (×4): qty 1

## 2013-11-10 MED ORDER — FENTANYL CITRATE 0.05 MG/ML IJ SOLN
INTRAMUSCULAR | Status: AC
  Filled 2013-11-10: qty 5

## 2013-11-10 MED ORDER — LORAZEPAM 0.5 MG PO TABS
0.5000 mg | ORAL_TABLET | Freq: Once | ORAL | Status: AC
  Administered 2013-11-10: 0.5 mg via ORAL
  Filled 2013-11-10: qty 1

## 2013-11-10 MED ORDER — GLYCOPYRROLATE 0.2 MG/ML IJ SOLN
INTRAMUSCULAR | Status: AC
  Filled 2013-11-10: qty 2

## 2013-11-10 MED ORDER — MIDAZOLAM HCL 2 MG/2ML IJ SOLN
INTRAMUSCULAR | Status: AC
  Filled 2013-11-10: qty 2

## 2013-11-10 MED ORDER — MIDAZOLAM HCL 2 MG/2ML IJ SOLN
1.0000 mg | INTRAMUSCULAR | Status: DC | PRN
  Administered 2013-11-10: 2 mg via INTRAVENOUS

## 2013-11-10 MED ORDER — PROPOFOL 10 MG/ML IV BOLUS
INTRAVENOUS | Status: DC | PRN
  Administered 2013-11-10: 150 mg via INTRAVENOUS

## 2013-11-10 MED ORDER — GLYCOPYRROLATE 0.2 MG/ML IJ SOLN
0.2000 mg | Freq: Once | INTRAMUSCULAR | Status: AC
  Administered 2013-11-10: 0.2 mg via INTRAVENOUS

## 2013-11-10 MED ORDER — ONDANSETRON HCL 4 MG/2ML IJ SOLN
4.0000 mg | Freq: Four times a day (QID) | INTRAMUSCULAR | Status: DC | PRN
  Administered 2013-11-10: 4 mg via INTRAVENOUS

## 2013-11-10 MED ORDER — LIDOCAINE HCL 1 % IJ SOLN
INTRAMUSCULAR | Status: DC | PRN
  Administered 2013-11-10: 50 mg via INTRADERMAL

## 2013-11-10 MED ORDER — POTASSIUM CHLORIDE IN NACL 20-0.9 MEQ/L-% IV SOLN
INTRAVENOUS | Status: DC
  Administered 2013-11-10 – 2013-11-14 (×9): via INTRAVENOUS

## 2013-11-10 MED ORDER — HYDROMORPHONE HCL PF 1 MG/ML IJ SOLN
INTRAMUSCULAR | Status: AC
  Filled 2013-11-10: qty 1

## 2013-11-10 MED ORDER — PROMETHAZINE HCL 25 MG/ML IJ SOLN
INTRAMUSCULAR | Status: AC
Start: 2013-11-10 — End: 2013-11-10
  Filled 2013-11-10: qty 1

## 2013-11-10 MED ORDER — MIDAZOLAM HCL 2 MG/2ML IJ SOLN
1.0000 mg | INTRAMUSCULAR | Status: DC | PRN
  Administered 2013-11-10 (×2): 2 mg via INTRAVENOUS

## 2013-11-10 MED ORDER — OXYCODONE-ACETAMINOPHEN 5-325 MG PO TABS
1.0000 | ORAL_TABLET | ORAL | Status: DC | PRN
  Administered 2013-11-10: 2 via ORAL
  Filled 2013-11-10: qty 2

## 2013-11-10 MED ORDER — DEXAMETHASONE SODIUM PHOSPHATE 4 MG/ML IJ SOLN
INTRAMUSCULAR | Status: AC
  Filled 2013-11-10: qty 1

## 2013-11-10 MED ORDER — KETOROLAC TROMETHAMINE 30 MG/ML IJ SOLN
30.0000 mg | Freq: Once | INTRAMUSCULAR | Status: AC
  Administered 2013-11-10: 30 mg via INTRAVENOUS

## 2013-11-10 MED ORDER — HYDROMORPHONE HCL PF 1 MG/ML IJ SOLN
0.5000 mg | INTRAMUSCULAR | Status: AC | PRN
  Administered 2013-11-10 (×4): 0.5 mg via INTRAVENOUS
  Filled 2013-11-10: qty 1

## 2013-11-10 MED ORDER — CIPROFLOXACIN IN D5W 400 MG/200ML IV SOLN
400.0000 mg | Freq: Two times a day (BID) | INTRAVENOUS | Status: DC
  Administered 2013-11-10: 400 mg via INTRAVENOUS

## 2013-11-10 MED ORDER — HYDROMORPHONE 0.3 MG/ML IV SOLN
INTRAVENOUS | Status: DC
  Administered 2013-11-10 – 2013-11-11 (×4): via INTRAVENOUS
  Administered 2013-11-11: 2.4 mg via INTRAVENOUS
  Administered 2013-11-11: 7.8 mg via INTRAVENOUS
  Administered 2013-11-11: 3.9 mg via INTRAVENOUS
  Administered 2013-11-11 – 2013-11-12 (×2): via INTRAVENOUS
  Administered 2013-11-12: 3.22 mg via INTRAVENOUS
  Administered 2013-11-12: 04:00:00 via INTRAVENOUS
  Administered 2013-11-12: 3.19 mg via INTRAVENOUS
  Administered 2013-11-12: 19:00:00 via INTRAVENOUS
  Administered 2013-11-12: 3.3 mg via INTRAVENOUS
  Administered 2013-11-12: 0.3 mg via INTRAVENOUS
  Administered 2013-11-13: 5.1 mg via INTRAVENOUS
  Administered 2013-11-13: 07:00:00 via INTRAVENOUS
  Administered 2013-11-13: 2.7 mg via INTRAVENOUS
  Administered 2013-11-13: 1.8 mg via INTRAVENOUS
  Administered 2013-11-13: 20:00:00 via INTRAVENOUS
  Administered 2013-11-13: 2.1 mg via INTRAVENOUS
  Administered 2013-11-13: 2.7 mg via INTRAVENOUS
  Administered 2013-11-14: 2.4 mg via INTRAVENOUS
  Administered 2013-11-14: 3.9 mg via INTRAVENOUS
  Administered 2013-11-14: 09:00:00 via INTRAVENOUS
  Administered 2013-11-14: 2.7 mg via INTRAVENOUS
  Administered 2013-11-14: 1.5 mg via INTRAVENOUS
  Filled 2013-11-10 (×10): qty 25

## 2013-11-10 MED ORDER — SUCCINYLCHOLINE CHLORIDE 20 MG/ML IJ SOLN
INTRAMUSCULAR | Status: DC | PRN
  Administered 2013-11-10: 160 mg via INTRAVENOUS

## 2013-11-10 MED ORDER — GLYCOPYRROLATE 0.2 MG/ML IJ SOLN
INTRAMUSCULAR | Status: DC | PRN
  Administered 2013-11-10 (×2): 0.2 mg via INTRAVENOUS

## 2013-11-10 MED ORDER — OXYCODONE-ACETAMINOPHEN 5-325 MG PO TABS
1.0000 | ORAL_TABLET | ORAL | Status: DC | PRN
  Administered 2013-11-10 – 2013-11-11 (×5): 2 via ORAL
  Filled 2013-11-10 (×5): qty 2

## 2013-11-10 MED ORDER — SODIUM CHLORIDE 0.9 % IR SOLN
Status: DC | PRN
  Administered 2013-11-10: 3000 mL

## 2013-11-10 MED ORDER — DEXTROSE 5 % IV SOLN
INTRAVENOUS | Status: DC | PRN
  Administered 2013-11-10: 08:00:00 via INTRAVENOUS

## 2013-11-10 MED ORDER — ONDANSETRON HCL 4 MG PO TABS
4.0000 mg | ORAL_TABLET | Freq: Four times a day (QID) | ORAL | Status: DC | PRN
Start: 2013-11-10 — End: 2013-11-14

## 2013-11-10 MED ORDER — DEXAMETHASONE SODIUM PHOSPHATE 4 MG/ML IJ SOLN
4.0000 mg | Freq: Once | INTRAMUSCULAR | Status: AC
  Administered 2013-11-10: 4 mg via INTRAVENOUS

## 2013-11-10 MED ORDER — PROMETHAZINE HCL 25 MG/ML IJ SOLN
12.5000 mg | Freq: Once | INTRAMUSCULAR | Status: AC
  Administered 2013-11-10: 12.5 mg via INTRAVENOUS

## 2013-11-10 MED ORDER — FENTANYL CITRATE 0.05 MG/ML IJ SOLN
INTRAMUSCULAR | Status: AC
  Filled 2013-11-10: qty 2

## 2013-11-10 MED ORDER — NALOXONE HCL 0.4 MG/ML IJ SOLN: 0.4000 mg | INTRAMUSCULAR | Status: DC | PRN

## 2013-11-10 MED ORDER — MORPHINE SULFATE 2 MG/ML IJ SOLN
2.0000 mg | INTRAMUSCULAR | Status: DC | PRN
  Administered 2013-11-10 (×2): 2 mg via INTRAVENOUS
  Filled 2013-11-10: qty 1

## 2013-11-10 MED ORDER — ONDANSETRON HCL 4 MG/2ML IJ SOLN
4.0000 mg | Freq: Once | INTRAMUSCULAR | Status: AC
  Administered 2013-11-10: 4 mg via INTRAVENOUS

## 2013-11-10 MED ORDER — ROCURONIUM BROMIDE 100 MG/10ML IV SOLN
INTRAVENOUS | Status: DC | PRN
  Administered 2013-11-10: 35 mg via INTRAVENOUS
  Administered 2013-11-10: 10 mg via INTRAVENOUS

## 2013-11-10 MED ORDER — HYDROMORPHONE HCL PF 1 MG/ML IJ SOLN
0.5000 mg | INTRAMUSCULAR | Status: AC | PRN
  Administered 2013-11-10 (×2): 0.5 mg via INTRAVENOUS

## 2013-11-10 MED ORDER — BUPIVACAINE HCL (PF) 0.5 % IJ SOLN
INTRAMUSCULAR | Status: DC | PRN
  Administered 2013-11-10: 9 mL

## 2013-11-10 MED ORDER — KETOROLAC TROMETHAMINE 30 MG/ML IJ SOLN
INTRAMUSCULAR | Status: AC
  Filled 2013-11-10: qty 1

## 2013-11-10 MED ORDER — FENTANYL CITRATE 0.05 MG/ML IJ SOLN
INTRAMUSCULAR | Status: DC | PRN
  Administered 2013-11-10: 150 ug via INTRAVENOUS
  Administered 2013-11-10 (×3): 50 ug via INTRAVENOUS
  Administered 2013-11-10 (×2): 100 ug via INTRAVENOUS
  Administered 2013-11-10 (×2): 50 ug via INTRAVENOUS
  Administered 2013-11-10: 100 ug via INTRAVENOUS
  Administered 2013-11-10: 50 ug via INTRAVENOUS

## 2013-11-10 MED ORDER — ONDANSETRON HCL 4 MG/2ML IJ SOLN: 4.0000 mg | Freq: Once | INTRAMUSCULAR | Status: DC | PRN

## 2013-11-10 MED ORDER — ONDANSETRON HCL 4 MG/2ML IJ SOLN: 4.0000 mg | Freq: Four times a day (QID) | INTRAMUSCULAR | Status: DC | PRN

## 2013-11-10 MED ORDER — FENTANYL CITRATE 0.05 MG/ML IJ SOLN
25.0000 ug | INTRAMUSCULAR | Status: DC | PRN
  Administered 2013-11-10 (×3): 50 ug via INTRAVENOUS

## 2013-11-10 MED ORDER — MORPHINE SULFATE 2 MG/ML IJ SOLN
INTRAMUSCULAR | Status: AC
  Filled 2013-11-10: qty 1

## 2013-11-10 MED ORDER — PROPOFOL 10 MG/ML IV EMUL
INTRAVENOUS | Status: AC
  Filled 2013-11-10: qty 20

## 2013-11-10 MED ORDER — LACTATED RINGERS IV SOLN
INTRAVENOUS | Status: DC
  Administered 2013-11-10 (×3): via INTRAVENOUS

## 2013-11-10 MED ORDER — 0.9 % SODIUM CHLORIDE (POUR BTL) OPTIME
TOPICAL | Status: DC | PRN
  Administered 2013-11-10: 1000 mL

## 2013-11-10 MED ORDER — MIDAZOLAM HCL 5 MG/5ML IJ SOLN
INTRAMUSCULAR | Status: DC | PRN
  Administered 2013-11-10: 2 mg via INTRAVENOUS

## 2013-11-10 MED ORDER — DOCUSATE SODIUM 100 MG PO CAPS
100.0000 mg | ORAL_CAPSULE | Freq: Two times a day (BID) | ORAL | Status: DC
  Administered 2013-11-10 – 2013-11-14 (×9): 100 mg via ORAL
  Filled 2013-11-10 (×9): qty 1

## 2013-11-10 MED ORDER — SODIUM CHLORIDE 0.9 % IJ SOLN: 9.0000 mL | INTRAMUSCULAR | Status: DC | PRN

## 2013-11-10 SURGICAL SUPPLY — 74 items
APPLICATOR COTTON TIP 6IN STRL (MISCELLANEOUS) ×3 IMPLANT
APPLIER CLIP LAPSCP 10X32 DD (CLIP) ×3 IMPLANT
ATTRACTOMAT 16X20 MAGNETIC DRP (DRAPES) IMPLANT
BAG HAMPER (MISCELLANEOUS) ×3 IMPLANT
BAG SPEC RTRVL LRG 6X4 10 (ENDOMECHANICALS) ×1
BLADE SURG 15 STRL LF DISP TIS (BLADE) ×1 IMPLANT
BLADE SURG 15 STRL SS (BLADE) ×2
BLADE SURG SZ10 CARB STEEL (BLADE) IMPLANT
CLOTH BEACON ORANGE TIMEOUT ST (SAFETY) ×3 IMPLANT
COVER LIGHT HANDLE STERIS (MISCELLANEOUS) ×6 IMPLANT
DECANTER SPIKE VIAL GLASS SM (MISCELLANEOUS) ×3 IMPLANT
DISSECTOR BLUNT TIP ENDO 5MM (MISCELLANEOUS) ×3 IMPLANT
DRAPE WARM FLUID 44X44 (DRAPE) IMPLANT
DRSG TEGADERM 2-3/8X2-3/4 SM (GAUZE/BANDAGES/DRESSINGS) ×12 IMPLANT
ELECT BLADE 6 FLAT ULTRCLN (ELECTRODE) IMPLANT
ELECT REM PT RETURN 9FT ADLT (ELECTROSURGICAL) ×3
ELECTRODE REM PT RTRN 9FT ADLT (ELECTROSURGICAL) ×1 IMPLANT
EVACUATOR DRAINAGE 10X20 100CC (DRAIN) ×1 IMPLANT
EVACUATOR SILICONE 100CC (DRAIN) ×2
FILTER SMOKE EVAC LAPAROSHD (FILTER) ×3 IMPLANT
FORMALIN 10 PREFIL 120ML (MISCELLANEOUS) ×3 IMPLANT
GLOVE BIOGEL PI IND STRL 7.0 (GLOVE) ×2 IMPLANT
GLOVE BIOGEL PI IND STRL 8.5 (GLOVE) ×1 IMPLANT
GLOVE BIOGEL PI INDICATOR 7.0 (GLOVE) ×4
GLOVE BIOGEL PI INDICATOR 8.5 (GLOVE) ×2
GLOVE ECLIPSE 7.0 STRL STRAW (GLOVE) ×3 IMPLANT
GLOVE ECLIPSE 8.5 STRL (GLOVE) ×3 IMPLANT
GLOVE EXAM NITRILE PF MED BLUE (GLOVE) ×3 IMPLANT
GLOVE SKINSENSE NS SZ7.0 (GLOVE) ×4
GLOVE SKINSENSE STRL SZ7.0 (GLOVE) ×2 IMPLANT
GLOVE SS BIOGEL STRL SZ 6.5 (GLOVE) ×1 IMPLANT
GLOVE SUPERSENSE BIOGEL SZ 6.5 (GLOVE) ×2
GOWN STRL REUS W/TWL LRG LVL3 (GOWN DISPOSABLE) ×9 IMPLANT
HEMOSTAT SURGICEL 4X8 (HEMOSTASIS) ×3 IMPLANT
INST SET LAPROSCOPIC AP (KITS) ×3 IMPLANT
IV NS IRRIG 3000ML ARTHROMATIC (IV SOLUTION) ×3 IMPLANT
KIT ROOM TURNOVER APOR (KITS) ×3 IMPLANT
MANIFOLD NEPTUNE II (INSTRUMENTS) ×3 IMPLANT
NEEDLE INSUFFLATION 14GA 120MM (NEEDLE) ×3 IMPLANT
NS IRRIG 1000ML POUR BTL (IV SOLUTION) ×3 IMPLANT
PACK LAP CHOLE LZT030E (CUSTOM PROCEDURE TRAY) ×3 IMPLANT
PAD ARMBOARD 7.5X6 YLW CONV (MISCELLANEOUS) ×3 IMPLANT
PENCIL HANDSWITCHING (ELECTRODE) IMPLANT
POUCH SPECIMEN RETRIEVAL 10MM (ENDOMECHANICALS) ×3 IMPLANT
SET BASIN LINEN APH (SET/KITS/TRAYS/PACK) ×3 IMPLANT
SET TUBE IRRIG SUCTION NO TIP (IRRIGATION / IRRIGATOR) ×3 IMPLANT
SLEEVE ENDOPATH XCEL 5M (ENDOMECHANICALS) ×3 IMPLANT
SOL PREP PROV IODINE SCRUB 4OZ (MISCELLANEOUS) IMPLANT
SPONGE DRAIN TRACH 4X4 STRL 2S (GAUZE/BANDAGES/DRESSINGS) ×3 IMPLANT
SPONGE GAUZE 2X2 8PLY STER LF (GAUZE/BANDAGES/DRESSINGS) ×1
SPONGE GAUZE 2X2 8PLY STRL LF (GAUZE/BANDAGES/DRESSINGS) ×2 IMPLANT
SPONGE GAUZE 4X4 12PLY (GAUZE/BANDAGES/DRESSINGS) ×3 IMPLANT
SPONGE INTESTINAL PEANUT (DISPOSABLE) IMPLANT
SPONGE LAP 18X18 X RAY DECT (DISPOSABLE) IMPLANT
STAPLER VISISTAT 35W (STAPLE) ×3 IMPLANT
SUT ETHILON 3 0 FSL (SUTURE) ×3 IMPLANT
SUT SILK 2 0 (SUTURE)
SUT SILK 2 0 SH (SUTURE) IMPLANT
SUT SILK 2-0 18XBRD TIE 12 (SUTURE) IMPLANT
SUT SILK 3 0 SH CR/8 (SUTURE) IMPLANT
SUT VIC AB 0 CT1 27 (SUTURE) ×2
SUT VIC AB 0 CT1 27XBRD ANTBC (SUTURE) ×1 IMPLANT
SUT VIC AB 0 CT1 27XCR 8 STRN (SUTURE) IMPLANT
SUT VICRYL 0 UR6 27IN ABS (SUTURE) ×3 IMPLANT
SYR BULB IRRIGATION 50ML (SYRINGE) IMPLANT
TOWEL OR 17X26 4PK STRL BLUE (TOWEL DISPOSABLE) IMPLANT
TRAY FOLEY CATH 16FR SILVER (SET/KITS/TRAYS/PACK) ×3 IMPLANT
TROCAR ENDO BLADELESS 11MM (ENDOMECHANICALS) ×3 IMPLANT
TROCAR XCEL NON-BLD 5MMX100MML (ENDOMECHANICALS) ×3 IMPLANT
TROCAR XCEL UNIV SLVE 11M 100M (ENDOMECHANICALS) ×3 IMPLANT
TUBING INSUFFLATION HIGH FLOW (TUBING) ×3 IMPLANT
WARMER LAPAROSCOPE (MISCELLANEOUS) ×3 IMPLANT
WATER STERILE IRR 1000ML POUR (IV SOLUTION) ×6 IMPLANT
YANKAUER SUCT BULB TIP 10FT TU (MISCELLANEOUS) ×3 IMPLANT

## 2013-11-10 NOTE — Anesthesia Postprocedure Evaluation (Signed)
  Anesthesia Post-op Note  Patient: Catherine Ramirez  Procedure(s) Performed: Procedure(s): LAPAROSCOPIC CHOLECYSTECTOMY (N/A)  Patient Location: PACU  Anesthesia Type:General  Level of Consciousness: awake, alert , oriented and patient cooperative  Airway and Oxygen Therapy: Patient Spontanous Breathing  Post-op Pain: 3 /10, mild  Post-op Assessment: Post-op Vital signs reviewed, Patient's Cardiovascular Status Stable, Respiratory Function Stable, Patent Airway and Pain level controlled  Post-op Vital Signs: Reviewed and stable  Last Vitals:  Filed Vitals:   11/10/13 0745  BP: 96/66  Temp:   Resp: 19    Complications: No apparent anesthesia complications

## 2013-11-10 NOTE — Brief Op Note (Signed)
11/10/2013  9:44 AM  PATIENT:  Barbera Setters  28 y.o. female  PRE-OPERATIVE DIAGNOSIS:  cholelithiasis  POST-OPERATIVE DIAGNOSIS:  cholelithiasis  PROCEDURE:  Procedure(s): LAPAROSCOPIC CHOLECYSTECTOMY (N/A)  SURGEON:  Surgeon(s) and Role:    * Scherry Ran, MD - Primary  PHYSICIAN ASSISTANT:   ASSISTANTS: none   ANESTHESIA:   general  EBL:  Total I/O In: 1800 [I.V.:1800] Out: -   BLOOD ADMINISTERED:none  DRAINS: Penrose drain in the in the liver bed.   LOCAL MEDICATIONS USED:  MARCAINE 0.5% ~ 10 cc.  SPECIMEN:  Source of Specimen:  gallbladder and stones.  DISPOSITION OF SPECIMEN:  PATHOLOGY  COUNTS:  YES  TOURNIQUET:  * No tourniquets in log *  DICTATION: .Other Dictation: Dictation Number OR dict. #  [id number].  PLAN OF CARE: Admit for overnight observation  PATIENT DISPOSITION:  PACU - hemodynamically stable.   Delay start of Pharmacological VTE agent (>24hrs) due to surgical blood loss or risk of bleeding: not applicable

## 2013-11-10 NOTE — OR Nursing (Signed)
Betadine used to prep prior to inserting foley, will monitor pt for any reaction and clean area with water at end of procedure.

## 2013-11-10 NOTE — Progress Notes (Signed)
Pt and family have been educated concerning pain medication and the effects on the respiratory system. Pt and family members still advocating for pt concerning her chronic pain issues.  MD is aware of this and requests that patient stay on her current regimen and will not be ordering anything further.  Patient did ask for medication to help her sleep and MD placed orders for that.  Pt has taken photographs of the leaking IV before she notified the RN.  Pt requesting that nurse give her the full amount that was "wasted in the floor".  Patient had figured this out to be aprox 4 mg.  Patient is demanding this be given or she will leave. Patient has already received several doses of pain medication via the IV route upon admission to the floor as well as oral pain medication and in the PACU.  At this point giving anymore pain medication could cause harm to the patient and would not feel save giving her anymore that was has been ordered. Pt and family have been educated on this and still requesting patient be given more pain medication.  Supervisor was notified by the patient concerning this and has calmed the situation at this time.
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
## 2013-11-10 NOTE — Anesthesia Preprocedure Evaluation (Addendum)
Anesthesia Evaluation  Patient identified by MRN, date of birth, ID band Patient awake    Reviewed: Allergy & Precautions, H&P , NPO status , Patient's Chart, lab work & pertinent test results, reviewed documented beta blocker date and time   History of Anesthesia Complications (+) DIFFICULT AIRWAY and Emergence Delirium  Airway Mallampati: II TM Distance: >3 FB Neck ROM: Full    Dental  (+) Teeth Intact   Pulmonary Current Smoker,  breath sounds clear to auscultation        Cardiovascular Rhythm:Regular Rate:Normal     Neuro/Psych    GI/Hepatic   Endo/Other    Renal/GU      Musculoskeletal   Abdominal (+)  Abdomen: soft. Bowel sounds: normal.  Peds  Hematology  (+) anemia ,   Anesthesia Other Findings   Reproductive/Obstetrics                        Anesthesia Physical Anesthesia Plan  ASA: II  Anesthesia Plan: General   Post-op Pain Management:    Induction: Intravenous  Airway Management Planned: Oral ETT  Additional Equipment:   Intra-op Plan:   Post-operative Plan: Extubation in OR  Informed Consent: I have reviewed the patients History and Physical, chart, labs and discussed the procedure including the risks, benefits and alternatives for the proposed anesthesia with the patient or authorized representative who has indicated his/her understanding and acceptance.     Plan Discussed with:   Anesthesia Plan Comments:        Anesthesia Quick Evaluation
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
## 2013-11-10 NOTE — Op Note (Signed)
NAMEDEVIKA, DRAGOVICH              ACCOUNT NO.:  [id number]  MEDICAL RECORD NO.:  [id number]  LOCATION:  Z767                          FACILITY:  [facility]  PHYSICIAN:  Felicie Morn, M.D. DATE OF BIRTH:  July 31, 1986  DATE OF PROCEDURE:  11/10/2013 DATE OF DISCHARGE:                              OPERATIVE REPORT   NOTE:  This is a 28 year old white female who had recurrent episodes of right upper quadrant pain with nausea and pain radiating to her back and which was postprandial in nature.  She is several weeks postop from a gynecological surgery for a left salpingo-oophorectomy for an ovarian cyst, and which was complicated by a re-exploration for presumed bleeding.  She was self-referred to my office when she was noted to have also cholelithiasis and right upper quadrant pain.  GROSS OPERATIVE FINDINGS:  The patient had some adhesions around the distal portion of the gallbladder.  The gallbladder, however, was not thickened.  She had multiple stones within the gallbladder.  The cystic duct, otherwise appeared to be besides being somewhat short was of normal caliber.  WOUND CLASSIFICATION:  Clean, contaminated.  SPECIMEN:  Gallbladder and stones.  PROCEDURE:  Laparoscopic cholecystectomy.  TECHNIQUE:  The patient was placed in supine position and after the adequate administration of general anesthesia, she was prepped with a solution that did not contain iodine and she is allergic to iodine.  A Foley catheter was aseptically inserted.  However, the nurses did use iodine for the prepping of the Foley catheter.  This was noted.  The periumbilical incision was then carried out over the superior aspect of the umbilicus and we dissected down to the fascia which was grasped with a sharp towel clip and elevated and then the Veress needle was inserted and confirmed in position with a saline drop test.  We then placed a 15 mm cannula in the umbilicus incision and then under  direct vision, an 11 mm cannula was placed in the epigastrium and then two 5 mm cannulas in the right upper quadrant laterally.  The gallbladder was grasped.  Its adhesions were taken down.  The cystic duct and cystic artery were carefully dissected and triply silver clipped and divided. The gallbladder was then removed without spillage from the liver bed, and we had a little bleeding on the liver bed.  However, this was easily controlled with a cautery device.  We placed the gallbladder in the endosac and removed this and then after checking for hemostasis and irrigating, I elected to leave a piece of Surgicel in the liver bed and the Jackson-Pratt drain which exited through the lateral most incision, 5 mm cannula site incision.  The abdomen was then desufflated.  I used 0.5% Sensorcaine to anesthetize the area around the fascia at the port sites and closed the fascia in the area of the umbilicus and the epigastrium with 0 Polysorb and Surgiclips were used to close the skin. The drain was sutured in place with 3-0 nylon.  Prior to closure, all sponge, needle, and instrument counts were found to be correct.  The patient received 1500 mL of crystalloids intraoperatively.  There were no complications.  Blood loss was maybe 25 mL.  There were no complications.     Felicie Morn, M.D.     WB/MEDQ  D:  11/10/2013  T:  11/10/2013  Job:  [id number]  cc:   [name], M.D. Fax: [phone]  [facility]. Legrand Rams, MD Fax: [phone]

## 2013-11-10 NOTE — Transfer of Care (Signed)
Immediate Anesthesia Transfer of Care Note  Patient: Catherine Ramirez  Procedure(s) Performed: Procedure(s): LAPAROSCOPIC CHOLECYSTECTOMY (N/A)  Patient Location: PACU  Anesthesia Type:General  Level of Consciousness: awake and patient cooperative  Airway & Oxygen Therapy: Patient Spontanous Breathing and Patient connected to face mask oxygen  Post-op Assessment: Report given to PACU RN, Post -op Vital signs reviewed and stable and Patient moving all extremities  Post vital signs: Reviewed and stable  Complications: No apparent anesthesia complications

## 2013-11-10 NOTE — Progress Notes (Signed)
Surgery  27 yr. Old W. Female for Lap cholecystectomy for cholecystitis secondary to cholelithiasis reviewed.  H/H 11.4/34.5.  (done on 11/08/13)  Procedure and risks explained and informed consent obtained No clinical change since H&P; dict.#[id number].  Temp 98.9, 97 HR, O2 sat 98% BP 109/73.

## 2013-11-10 NOTE — Anesthesia Procedure Notes (Signed)
Procedure Name: Intubation Date/Time: 11/10/2013 7:52 AM Performed by: Charmaine Downs Pre-anesthesia Checklist: Suction available, Patient being monitored, Emergency Drugs available and Patient identified Patient Re-evaluated:Patient Re-evaluated prior to inductionOxygen Delivery Method: Circle system utilized Preoxygenation: Pre-oxygenation with 100% oxygen Intubation Type: IV induction, Cricoid Pressure applied and Rapid sequence Ventilation: Mask ventilation without difficulty and Oral airway inserted - appropriate to patient size Laryngoscope Size: Mac and 3 Grade View: Grade I Tube type: Oral Number of attempts: 1 Airway Equipment and Method: Stylet Placement Confirmation: ETT inserted through vocal cords under direct vision,  positive ETCO2 and breath sounds checked- equal and bilateral Secured at: 22 cm Tube secured with: Tape Dental Injury: Teeth and Oropharynx as per pre-operative assessment
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
## 2013-11-11 ENCOUNTER — Encounter (HOSPITAL_COMMUNITY): Payer: Self-pay | Admitting: General Surgery

## 2013-11-11 DIAGNOSIS — K8010 Calculus of gallbladder with chronic cholecystitis without obstruction: Secondary | ICD-10-CM | POA: Diagnosis present

## 2013-11-11 LAB — CBC
HCT: 29.3 % — ABNORMAL LOW (ref 36.0–46.0)
HEMOGLOBIN: 9.8 g/dL — AB (ref 12.0–15.0)
MCH: 30.3 pg (ref 26.0–34.0)
MCHC: 33.4 g/dL (ref 30.0–36.0)
MCV: 90.7 fL (ref 78.0–100.0)
Platelets: 208 10*3/uL (ref 150–400)
RBC: 3.23 MIL/uL — ABNORMAL LOW (ref 3.87–5.11)
RDW: 13 % (ref 11.5–15.5)
WBC: 12.7 10*3/uL — ABNORMAL HIGH (ref 4.0–10.5)

## 2013-11-11 LAB — BASIC METABOLIC PANEL
BUN: 12 mg/dL (ref 6–23)
CO2: 27 mEq/L (ref 19–32)
Calcium: 8.4 mg/dL (ref 8.4–10.5)
Chloride: 107 mEq/L (ref 96–112)
Creatinine, Ser: 0.66 mg/dL (ref 0.50–1.10)
GFR calc Af Amer: >90 mL/min (ref >90)
GFR calc non Af Amer: >90 mL/min (ref >90)
GLUCOSE: 121 mg/dL — AB (ref 70–99)
Potassium: 4.1 mEq/L (ref 3.7–5.3)
Sodium: 142 mEq/L (ref 137–147)

## 2013-11-11 LAB — HEPATIC FUNCTION PANEL
ALT: 27 U/L (ref 0–35)
AST: 40 U/L — ABNORMAL HIGH (ref 0–37)
Albumin: 2.8 g/dL — ABNORMAL LOW (ref 3.5–5.2)
Alkaline Phosphatase: 58 U/L (ref 39–117)
BILIRUBIN TOTAL: 0.3 mg/dL (ref 0.3–1.2)
Total Protein: 6.5 g/dL (ref 6.0–8.3)

## 2013-11-11 MED ORDER — FLEET ENEMA 7-19 GM/118ML RE ENEM
1.0000 | ENEMA | Freq: Once | RECTAL | Status: AC
  Administered 2013-11-11: 1 via RECTAL

## 2013-11-11 NOTE — Progress Notes (Signed)
Nurse was called to patient's room and upon assessment, patient was agitated and anxious.  Patient was angry because the floor was apple juice and did not have anything that she wanted to drink.  Patient had a fresh cup of water, a gingerale, and a coke all at bedside but stated she did not like any of the options.  Patient was also angry because her son had not been given a bandaid.  Patient was verbally aggressive towards the staff and stated that this was the worst service she had received in the past 12 hours and was threatening to leave.  Pt. Was provided emotional support, education on her safety, and our goal of keeping her safe but comfortable throughout the night.

## 2013-11-11 NOTE — Progress Notes (Signed)
Pt. Has been noncompliant with full liquid diet.  Patient had family bring in outside food, such as chips, sugar wafers, and japanese food.  Pt. Appears to be tolerating food well.  Will continue to monitor.

## 2013-11-11 NOTE — Progress Notes (Signed)
Patient called to nurses station and demanded that someone come to her room immediately.  Nurse went to room right away and patient informed nurse that her bed was wet and stated that she had emptied her JP drain herself.  Nurse informed patient that it was important that this was measured and documented.  Patient stated that MD had instructed her on how to empty the JP drain and that she could do it herself.  Again, educated patient that nursing staff needed to measure the amount in the JP drain.

## 2013-11-11 NOTE — Progress Notes (Signed)
POD # 1 Filed Vitals:   11/11/13 1242  BP: 117/80  Pulse: 89  Temp: 98.4 F (36.9 C)  Resp: 14    Pt appears more comfortable but still has pain control issues.  Still on PCA  Wound clean and redressed and there was minimal JP drainage and the bili was normal so I removed the drain.  Pt tolerating PO but abdomen is fairly silent with an ileus induced by her narcotic use.  I spent a great deal of time explaining pain control measures with her and the importance of ambulation.  We will continue stool softeners as well as miralax and also give her an enema.  Hopefully we can discharge pt this afternoon if she shows clinical improvement.  Otherwise with her pain control issues I feel she will return to ER.  Labs OK; will continue to follow as in patient at this point.

## 2013-11-11 NOTE — Progress Notes (Signed)
Filed Vitals:   11/11/13 1419  BP: 107/69  Pulse: 78  Temp: 98.4 F (36.9 C)  Resp: 16    Abdomen soft, No active peristalsis, trying to titrate pain meds.  Had some success with fleets but I think just the stool in rectum. Voiding well.  Wound clean and dry.  Will continue stool softeners and encourage ambulation.  Hopefully will be able to discharge in AM.

## 2013-11-11 NOTE — Progress Notes (Addendum)
Patient reports BM prior to enema.  Patient ambulated in hallway this afternoon.

## 2013-11-11 NOTE — Progress Notes (Signed)
When entering pt's room, patient's IV was found out and blood on floor.  Applied pressure to the IV site to stop the bleeding and informed patient that we would get an IV site started as quickly as possible.  In the meantime, patient received oral pain medication and scheduled meds.  Patient's linens were changed and patient was cleaned.  AC was notified about loss of IV site and stated she would assist with starting a new IV as soon as possible.  During this time, patient was sitting in the chair, visiting with son and another visitor and appeared to be in no distress.    AC started new IV in patient's right hand and nurse connected patient to IV fluids and PCA pump almost immediately after new IV site was placed.  When nurse entered room to start PCA pump, patient became verbally aggressive towards nurse and stated that it took too long for her IV to be restarted and to be put back on her PCA pump.  Pt. Stated that she was going call the supervisor about her poor service that she has received.  Nurse offered for patient to speak with charge nurse or nursing supervisor and patient refused.  Notified charge nurse of situation.

## 2013-11-12 LAB — CBC
HCT: 26.9 % — ABNORMAL LOW (ref 36.0–46.0)
Hemoglobin: 9.2 g/dL — ABNORMAL LOW (ref 12.0–15.0)
MCH: 31.6 pg (ref 26.0–34.0)
MCHC: 34.2 g/dL (ref 30.0–36.0)
MCV: 92.4 fL (ref 78.0–100.0)
Platelets: 185 10*3/uL (ref 150–400)
RBC: 2.91 MIL/uL — AB (ref 3.87–5.11)
RDW: 13.3 % (ref 11.5–15.5)
WBC: 7.4 10*3/uL (ref 4.0–10.5)

## 2013-11-12 LAB — BASIC METABOLIC PANEL
BUN: 7 mg/dL (ref 6–23)
CO2: 26 meq/L (ref 19–32)
Calcium: 8 mg/dL — ABNORMAL LOW (ref 8.4–10.5)
Chloride: 107 mEq/L (ref 96–112)
Creatinine, Ser: 0.67 mg/dL (ref 0.50–1.10)
GFR calc Af Amer: >90 mL/min (ref >90)
GFR calc non Af Amer: >90 mL/min (ref >90)
Glucose, Bld: 103 mg/dL — ABNORMAL HIGH (ref 70–99)
Potassium: 4 mEq/L (ref 3.7–5.3)
SODIUM: 142 meq/L (ref 137–147)

## 2013-11-12 NOTE — Progress Notes (Signed)
POD # 2  Filed Vitals:   11/12/13 1108  BP:   Pulse:   Temp:   Resp: 18  temp. 97.3, HR 73/min, BP 105/67 O2 sat 100% 2l/min  Abdomen less tender though still no active peristalsis.  Wounds clean and redressed.  Have d/c percocet and will try weaning from pump.  I do not want to send pt home on new pain meds as she is on a chronic pain regimen that should cover this post op status.  H/H 9.8/29  Now 9.2 26.9, likely hemodilution as pt clinically no signs of active bleeding and pt has good urine output and stable HR and BP. Will check counts in AM

## 2013-11-12 NOTE — Plan of Care (Signed)
Problem: Phase I Progression Outcomes Goal: Pain controlled with appropriate interventions Outcome: Progressing 11/12/13 2315 Dilaudid PCA per orders, pt states pain reduced with dilaudid PCA and toradol PRN as ordered for pain. Ambulates in room and hallway, tolerating well. C/o abdominal pain with activity. Donavan Foil, RN

## 2013-11-12 NOTE — Plan of Care (Signed)
Problem: Phase I Progression Outcomes Goal: OOB as tolerated unless otherwise ordered Outcome: Completed/Met Date Met:  11/12/13 Patient ambulating hallways frequently through day

## 2013-11-13 LAB — CBC WITH DIFFERENTIAL/PLATELET
BASOS ABS: 0 10*3/uL (ref 0.0–0.1)
Basophils Relative: 0 % (ref 0–1)
EOS PCT: 2 % (ref 0–5)
Eosinophils Absolute: 0.2 10*3/uL (ref 0.0–0.7)
HCT: 28.5 % — ABNORMAL LOW (ref 36.0–46.0)
Hemoglobin: 9.5 g/dL — ABNORMAL LOW (ref 12.0–15.0)
LYMPHS ABS: 2.8 10*3/uL (ref 0.7–4.0)
LYMPHS PCT: 31 % (ref 12–46)
MCH: 30.7 pg (ref 26.0–34.0)
MCHC: 33.3 g/dL (ref 30.0–36.0)
MCV: 92.2 fL (ref 78.0–100.0)
Monocytes Absolute: 0.8 10*3/uL (ref 0.1–1.0)
Monocytes Relative: 9 % (ref 3–12)
Neutro Abs: 5.4 10*3/uL (ref 1.7–7.7)
Neutrophils Relative %: 59 % (ref 43–77)
PLATELETS: 202 10*3/uL (ref 150–400)
RBC: 3.09 MIL/uL — AB (ref 3.87–5.11)
RDW: 13.3 % (ref 11.5–15.5)
WBC: 9.1 10*3/uL (ref 4.0–10.5)

## 2013-11-13 NOTE — Progress Notes (Signed)
POD # 3  Filed Vitals:   11/13/13 1200  BP:   Pulse:   Temp:   Resp: 11  temp 98.5,BP 112/73, resp 11/min O2 sat 100% 2l/min nasal canula.  Abdomen softer,  passed some flatus but still moving very slowly and has not ambulated yet today.  Again   encouraged to increase activity and told to limit pain meds.  Wounds clean, no problem voiding  H/H stable.  Hopefully can discharge in AM.

## 2013-11-13 NOTE — Progress Notes (Signed)
Dr. [name] in to see patient earlier this afternoon.  Encouraged patient to ambulate.  Patient ambulated on unit this afternoon.  Tolerated well.  Stated pain did not get worse during ambulation.  Patient returned to room.  Resting in bed.

## 2013-11-14 ENCOUNTER — Inpatient Hospital Stay (HOSPITAL_COMMUNITY): Payer: Medicaid Other

## 2013-11-14 ENCOUNTER — Encounter (HOSPITAL_COMMUNITY): Payer: Self-pay | Admitting: General Surgery

## 2013-11-14 DIAGNOSIS — K8000 Calculus of gallbladder with acute cholecystitis without obstruction: Secondary | ICD-10-CM | POA: Diagnosis present

## 2013-11-14 DIAGNOSIS — G894 Chronic pain syndrome: Secondary | ICD-10-CM | POA: Diagnosis present

## 2013-11-14 MED ORDER — DSS 100 MG PO CAPS: 100.0000 mg | ORAL_CAPSULE | Freq: Two times a day (BID) | ORAL | Status: DC

## 2013-11-14 MED ORDER — POLYETHYLENE GLYCOL 3350 17 G PO PACK: 17.0000 g | PACK | Freq: Every day | ORAL | Status: DC

## 2013-11-14 NOTE — Plan of Care (Signed)
Problem: Phase I Progression Outcomes Goal: Pain controlled with appropriate interventions Outcome: Progressing 11/14/13 0838 Discussed pain management with patient. States "that seems to help the most" referring to Toradol given PRN for breakthrough pain. Dilaudid PCA in place. Pt states "I haven't been using it, pressing the button very much". Donavan Foil, RN  Problem: Phase II Progression Outcomes Goal: Progressing with IS, TCDB Outcome: Progressing 11/14/13 0842 Encouraged cough, deep breathing. Encouraged to splint abdominal area with pillow for comfort. Janeece Fitting, RN Goal: Return of bowel function (flatus, BM) IF ABDOMINAL SURGERY:  Outcome: Progressing 11/14/13 0841 Patient states "no bowel movement yet". Received colace, Miralax, Linzess yesterday as ordered, see MAR. States "i have chronic constipation". Janeece Fitting, RN

## 2013-11-14 NOTE — Progress Notes (Signed)
11/14/13 1452 Late entry for 1415. Notified Dr. [name] of abdominal ultrasound results. Notified patient requests to have Toradol as ordered PRN prior discharge. Stated okay. States will come to discharge patient this afternoon. Donavan Foil, RN

## 2013-11-14 NOTE — Discharge Instructions (Signed)
Laparoscopic Cholecystectomy °Laparoscopic cholecystectomy is surgery to remove the gallbladder. The gallbladder is located in the upper right part of the abdomen, behind the liver. It is a storage sac for bile produced in the liver. Bile aids in the digestion and absorption of fats. Cholecystectomy is often done for inflammation of the gallbladder (cholecystitis). This condition is usually caused by a buildup of gallstones (cholelithiasis) in your gallbladder. Gallstones can block the flow of bile, resulting in inflammation and pain. In severe cases, emergency surgery may be required. When emergency surgery is not required, you will have time to prepare for the procedure. °Laparoscopic surgery is an alternative to open surgery. Laparoscopic surgery has a shorter recovery time. Your common bile duct may also need to be examined during the procedure. If stones are found in the common bile duct, they may be removed. °LET YOUR HEALTH CARE PROVIDER KNOW ABOUT: °· Any allergies you have. °· All medicines you are taking, including vitamins, herbs, eye drops, creams, and over-the-counter medicines. °· Previous problems you or members of your family have had with the use of anesthetics. °· Any blood disorders you have. °· Previous surgeries you have had. °· Medical conditions you have. °RISKS AND COMPLICATIONS °Generally, this is a safe procedure. However, as with any procedure, complications can occur. Possible complications include: °· Infection. °· Damage to the common bile duct, nerves, arteries, veins, or other internal organs such as the stomach, liver, or intestines. °· Bleeding. °· A stone may remain in the common bile duct. °· A bile leak from the cyst duct that is clipped when your gallbladder is removed. °· The need to convert to open surgery, which requires a larger incision in the abdomen. This may be necessary if your surgeon thinks it is not safe to continue with a laparoscopic procedure. °BEFORE THE  PROCEDURE °· Ask your health care provider about changing or stopping any regular medicines. You will need to stop taking aspirin or blood thinners at least 5 days prior to surgery. °· Do not eat or drink anything after midnight the night before surgery. °· Let your health care provider know if you develop a cold or other infectious problem before surgery. °PROCEDURE  °· You will be given medicine to make you sleep through the procedure (general anesthetic). A breathing tube will be placed in your mouth. °· When you are asleep, your surgeon will make several small cuts (incisions) in your abdomen. °· A thin, lighted tube with a tiny camera on the end (laparoscope) is inserted through one of the small incisions. The camera on the laparoscope sends a picture to a TV screen in the operating room. This gives the surgeon a good view inside your abdomen. °· A gas will be pumped into your abdomen. This expands your abdomen so that the surgeon has more room to perform the surgery. °· Other tools needed for the procedure are inserted through the other incisions. The gallbladder is removed through one of the incisions. °· After the removal of your gallbladder, the incisions will be closed with stitches, staples, or skin glue. °AFTER THE PROCEDURE °· You will be taken to a recovery area where your progress will be checked often. °· You may be allowed to go home the same day if your pain is controlled and you can tolerate liquids. °Document Released: 07/21/2005 Document Revised: 05/11/2013 Document Reviewed: 03/02/2013 °[employer]® Patient Information ©2014 [employer]. ° °

## 2013-11-14 NOTE — Progress Notes (Signed)
Filed Vitals:   11/14/13 1156  BP:   Pulse:   Temp:   Resp: 12  No acute changes.  Sonogram of RUQ shows completely normal post op changes.  Will discharge.  Instruction and bowel care explained.  Will add a glass of flavored Golytely daily to help with bowels as well as her other constipation meds.  Discharge and follow up arranged.  Dict#  [id number].

## 2013-11-14 NOTE — Progress Notes (Signed)
11/14/13 1635 Reviewed discharge instructions with patient. Given copy of AVS, f/u appointment as scheduled. Noted when home medications next due on list. Prescriptions called in to pharmacy per Dr. [name]. Dr. [name] states has given patient Toradol prescription. Patient removed IV site herself. Gauze dressing applied, site within normal limits. Discussed post-op instructions, education sheet, when to call MD. Verbalized understanding. Pt aware of incision care, alcohol swabs provided per MD request for incision care. Pt left floor in stable condition via w/c accompanied by nurse tech. Donavan Foil, RN

## 2013-11-14 NOTE — Progress Notes (Signed)
POD # 4   Filed Vitals:   11/14/13 1156  BP:   Pulse:   Temp:   Resp: 12  temp 98.1, BP 99/60, 74 HR  Abdomen about the same and pt still moving very slowly.  Will check RUQ sonogram to make sure there is not anything other than expected post op changes.  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

## 2013-11-14 NOTE — Progress Notes (Signed)
11/14/13 1130 Patient ambulating in hallway this morning, approximately 200 feet. Husband walking with her. Pt states tolerating well. Donavan Foil, RN

## 2013-11-15 NOTE — Discharge Summary (Signed)
NAMEIVELIS, Catherine Ramirez              ACCOUNT NO.:  [id number]  MEDICAL RECORD NO.:  [id number]  LOCATION:  V784                          FACILITY:  [facility]  PHYSICIAN:  Catherine Ramirez, M.D. DATE OF BIRTH:  1986/07/30  DATE OF ADMISSION:  11/10/2013 DATE OF DISCHARGE:  04/13/2015LH                              DISCHARGE SUMMARY   DIAGNOSES:  Cholecystitis and cholelithiasis.  SECONDARY DIAGNOSES: 1. Status post pelvic surgery for ruptured ovarian cyst by Dr.     Glo Herring complicated with re-exploration. 2. Irritable bowel syndrome with constipation secondary to chronic     pain medications. 3. Chronic pain syndrome.  NOTE:  This is a 28 year old white female, who was self-referred when she was found to have cholecystitis secondary to cholelithiasis.  Just prior to this in mid January, she had undergone surgeries by Dr. Glo Herring for ovarian problems.  She underwent a left salpingo- oophorectomy and this was complicated by re-exploration for presumed bleeding.  She was admitted to the hospital, and her surgery was uneventful.  A laparoscopic procedure was carried out.  Right upper quadrant otherwise appeared to be normal.  Postoperatively, however, she did not progress.  This was mostly due to her chronic pain problems as she had an extremely low tolerance for pain and was placed on various pain regimens including a PCA pump and it was difficult to wean her from this and she had a postoperative ileus.  Finally, on the fourth postoperatively day, she was somewhat stable, and she was passing some flatus, and her bowel sounds were present, and she certainly did not have any increased bowel sounds.  She was able to have a bowel movement with the use of enema as well as a bowel regimen which consisted of MiraLax and Colace.  At her last admission, she was even given some GoLYTELY which I might try as an outpatient as well to try to help her.  At any rate, at the time of her discharge, her  wound was clean.  On the second postoperative day, her Jackson-Pratt drain was removed.  There was very little drainage, and because she had such a slow progress, I wanted to make sure that this patient did not have an unexpected biloma or postoperative hematoma in the liver bed and a sonogram was done and this was completely normal just prior to her discharge.  She will be discharged and I will follow up with her in a week's time to remove her surgical clips.  Her discharge instructions and followup were arranged, and she is to follow up with her own physician regarding her chronic pain med use.  I will not discharge her on any narcotics.  I may give her some Toradol for discharge to help her for postoperative pain.  LABORATORY DATA:  The patient's H and H was stable.  Postoperatively, the hemoglobin of 9.5, and 28-29  hematocrit.  This was stable for her. At the time of discharge, she was as stated still having some trouble with her bowels, however, she appeared to be clinically improving from this.  She was voiding without dysuria, but she had to strain a bit, but there was no dysuria per se.  She had  no leg pain or chest pain.  Her wounds were all clean, and there was no erythema or any signs of any infection.  We will follow up with her perioperatively.     Catherine Ramirez, M.D.     WB/MEDQ  D:  11/14/2013  T:  11/15/2013  Job:  [id number]  cc:   Tesfaye D. Legrand Rams, MD Fax: [phone]  Dr. Glo Herring

## 2013-11-18 ENCOUNTER — Encounter (HOSPITAL_COMMUNITY): Payer: Self-pay | Admitting: Emergency Medicine

## 2013-11-18 ENCOUNTER — Observation Stay (HOSPITAL_COMMUNITY): Payer: Medicaid Other

## 2013-11-18 ENCOUNTER — Emergency Department (HOSPITAL_COMMUNITY): Payer: Medicaid Other

## 2013-11-18 ENCOUNTER — Encounter (HOSPITAL_COMMUNITY): Payer: Medicaid Other | Admitting: Anesthesiology

## 2013-11-18 ENCOUNTER — Encounter (HOSPITAL_COMMUNITY)
Admission: EM | Disposition: A | Discharge status: Home / Self Care | Payer: Self-pay | Source: Home / Self Care | Attending: Internal Medicine

## 2013-11-18 ENCOUNTER — Observation Stay (HOSPITAL_COMMUNITY): Payer: Medicaid Other | Admitting: Anesthesiology

## 2013-11-18 ENCOUNTER — Inpatient Hospital Stay (HOSPITAL_COMMUNITY)
Admission: EM | Admit: 2013-11-18 | Discharge: 2013-11-25 | DRG: 395 | Disposition: A | Discharge status: Home / Self Care | Payer: Medicaid Other | Attending: Internal Medicine | Admitting: Internal Medicine

## 2013-11-18 DIAGNOSIS — K838 Other specified diseases of biliary tract: Secondary | ICD-10-CM | POA: Diagnosis not present

## 2013-11-18 DIAGNOSIS — R52 Pain, unspecified: Secondary | ICD-10-CM | POA: Diagnosis present

## 2013-11-18 DIAGNOSIS — D649 Anemia, unspecified: Secondary | ICD-10-CM | POA: Diagnosis present

## 2013-11-18 DIAGNOSIS — Y836 Removal of other organ (partial) (total) as the cause of abnormal reaction of the patient, or of later complication, without mention of misadventure at the time of the procedure: Secondary | ICD-10-CM | POA: Diagnosis present

## 2013-11-18 DIAGNOSIS — G894 Chronic pain syndrome: Secondary | ICD-10-CM | POA: Diagnosis present

## 2013-11-18 DIAGNOSIS — K929 Disease of digestive system, unspecified: Principal | ICD-10-CM | POA: Diagnosis present

## 2013-11-18 DIAGNOSIS — R7989 Other specified abnormal findings of blood chemistry: Secondary | ICD-10-CM

## 2013-11-18 DIAGNOSIS — R109 Unspecified abdominal pain: Secondary | ICD-10-CM | POA: Diagnosis present

## 2013-11-18 DIAGNOSIS — R945 Abnormal results of liver function studies: Secondary | ICD-10-CM

## 2013-11-18 DIAGNOSIS — F17200 Nicotine dependence, unspecified, uncomplicated: Secondary | ICD-10-CM | POA: Diagnosis present

## 2013-11-18 DIAGNOSIS — K5909 Other constipation: Secondary | ICD-10-CM | POA: Diagnosis present

## 2013-11-18 DIAGNOSIS — K5900 Constipation, unspecified: Secondary | ICD-10-CM | POA: Diagnosis present

## 2013-11-18 DIAGNOSIS — K589 Irritable bowel syndrome without diarrhea: Secondary | ICD-10-CM | POA: Diagnosis present

## 2013-11-18 HISTORY — DX: Other constipation: K59.09

## 2013-11-18 HISTORY — DX: Irritable bowel syndrome, unspecified: K58.9

## 2013-11-18 HISTORY — DX: Other chronic pain: G89.29

## 2013-11-18 HISTORY — PX: ERCP: SHX5425

## 2013-11-18 HISTORY — DX: Pelvic and perineal pain: R10.2

## 2013-11-18 HISTORY — DX: Pelvic and perineal pain unspecified side: R10.20

## 2013-11-18 LAB — URINALYSIS, ROUTINE W REFLEX MICROSCOPIC
Bilirubin Urine: NEGATIVE
GLUCOSE, UA: 100 mg/dL — AB
Ketones, ur: 15 mg/dL — AB
Nitrite: POSITIVE — AB
PH: 6.5 (ref 5.0–8.0)
Protein, ur: >300 mg/dL — AB
SPECIFIC GRAVITY, URINE: 1.025 (ref 1.005–1.030)
Urobilinogen, UA: >8 mg/dL — ABNORMAL HIGH (ref 0.0–1.0)

## 2013-11-18 LAB — URINE MICROSCOPIC-ADD ON

## 2013-11-18 LAB — CBC WITH DIFFERENTIAL/PLATELET
BASOS ABS: 0 10*3/uL (ref 0.0–0.1)
BASOS PCT: 0 % (ref 0–1)
Eosinophils Absolute: 0.2 10*3/uL (ref 0.0–0.7)
Eosinophils Relative: 1 % (ref 0–5)
HCT: 35.5 % — ABNORMAL LOW (ref 36.0–46.0)
HEMOGLOBIN: 12.1 g/dL (ref 12.0–15.0)
LYMPHS PCT: 29 % (ref 12–46)
Lymphs Abs: 5 10*3/uL — ABNORMAL HIGH (ref 0.7–4.0)
MCH: 30.8 pg (ref 26.0–34.0)
MCHC: 34.1 g/dL (ref 30.0–36.0)
MCV: 90.3 fL (ref 78.0–100.0)
MONOS PCT: 9 % (ref 3–12)
Monocytes Absolute: 1.5 10*3/uL — ABNORMAL HIGH (ref 0.1–1.0)
Neutro Abs: 10.5 10*3/uL — ABNORMAL HIGH (ref 1.7–7.7)
Neutrophils Relative %: 61 % (ref 43–77)
Platelets: 465 10*3/uL — ABNORMAL HIGH (ref 150–400)
RBC: 3.93 MIL/uL (ref 3.87–5.11)
RDW: 12.7 % (ref 11.5–15.5)
SMEAR REVIEW: INCREASED
WBC: 17.2 10*3/uL — AB (ref 4.0–10.5)

## 2013-11-18 LAB — COMPREHENSIVE METABOLIC PANEL
ALK PHOS: 326 U/L — AB (ref 39–117)
ALT: 188 U/L — AB (ref 0–35)
AST: 150 U/L — AB (ref 0–37)
Albumin: 3.8 g/dL (ref 3.5–5.2)
BUN: 19 mg/dL (ref 6–23)
CO2: 25 meq/L (ref 19–32)
Calcium: 9.9 mg/dL (ref 8.4–10.5)
Chloride: 97 mEq/L (ref 96–112)
Creatinine, Ser: 0.64 mg/dL (ref 0.50–1.10)
GFR calc non Af Amer: >90 mL/min (ref >90)
GLUCOSE: 130 mg/dL — AB (ref 70–99)
POTASSIUM: 4.1 meq/L (ref 3.7–5.3)
SODIUM: 138 meq/L (ref 137–147)
TOTAL PROTEIN: 8.6 g/dL — AB (ref 6.0–8.3)
Total Bilirubin: 1.1 mg/dL (ref 0.3–1.2)

## 2013-11-18 LAB — LIPASE, BLOOD: Lipase: 17 U/L (ref 11–59)

## 2013-11-18 LAB — PREGNANCY, URINE: PREG TEST UR: NEGATIVE

## 2013-11-18 SURGERY — ERCP, WITH INTERVENTION IF INDICATED
Anesthesia: General

## 2013-11-18 MED ORDER — HYDROMORPHONE HCL PF 1 MG/ML IJ SOLN
2.0000 mg | INTRAMUSCULAR | Status: DC | PRN
  Administered 2013-11-18 – 2013-11-19 (×8): 2 mg via INTRAVENOUS
  Filled 2013-11-18 (×8): qty 2

## 2013-11-18 MED ORDER — PROPOFOL 10 MG/ML IV EMUL
INTRAVENOUS | Status: AC
  Filled 2013-11-18: qty 20

## 2013-11-18 MED ORDER — SODIUM CHLORIDE 0.9 % IV SOLN: INTRAVENOUS | Status: DC

## 2013-11-18 MED ORDER — HYDROMORPHONE HCL PF 1 MG/ML IJ SOLN
1.0000 mg | INTRAMUSCULAR | Status: DC | PRN
  Filled 2013-11-18: qty 1

## 2013-11-18 MED ORDER — GLYCOPYRROLATE 0.2 MG/ML IJ SOLN
INTRAMUSCULAR | Status: DC | PRN
  Administered 2013-11-18: 0.4 mg via INTRAVENOUS

## 2013-11-18 MED ORDER — FENTANYL CITRATE 0.05 MG/ML IJ SOLN
INTRAMUSCULAR | Status: DC | PRN
  Administered 2013-11-18 (×2): 50 ug via INTRAVENOUS
  Administered 2013-11-18: 150 ug via INTRAVENOUS

## 2013-11-18 MED ORDER — ONDANSETRON HCL 4 MG/2ML IJ SOLN
4.0000 mg | Freq: Once | INTRAMUSCULAR | Status: AC
  Administered 2013-11-18: 4 mg via INTRAVENOUS
  Filled 2013-11-18: qty 2

## 2013-11-18 MED ORDER — ROCURONIUM BROMIDE 100 MG/10ML IV SOLN
INTRAVENOUS | Status: DC | PRN
  Administered 2013-11-18: 5 mg via INTRAVENOUS
  Administered 2013-11-18: 10 mg via INTRAVENOUS

## 2013-11-18 MED ORDER — ONDANSETRON HCL 4 MG/2ML IJ SOLN
4.0000 mg | Freq: Four times a day (QID) | INTRAMUSCULAR | Status: DC | PRN
  Administered 2013-11-19 – 2013-11-25 (×4): 4 mg via INTRAVENOUS
  Filled 2013-11-18 (×4): qty 2

## 2013-11-18 MED ORDER — SODIUM CHLORIDE 0.9 % IV SOLN
INTRAVENOUS | Status: DC
  Administered 2013-11-18: via INTRAVENOUS

## 2013-11-18 MED ORDER — FENTANYL CITRATE 0.05 MG/ML IJ SOLN
INTRAMUSCULAR | Status: AC
  Filled 2013-11-18: qty 5

## 2013-11-18 MED ORDER — LEVOFLOXACIN IN D5W 500 MG/100ML IV SOLN
500.0000 mg | Freq: Once | INTRAVENOUS | Status: AC
  Administered 2013-11-18: 500 mg via INTRAVENOUS
  Filled 2013-11-18: qty 100

## 2013-11-18 MED ORDER — MIDAZOLAM HCL 5 MG/5ML IJ SOLN
INTRAMUSCULAR | Status: DC | PRN
  Administered 2013-11-18: 4 mg via INTRAVENOUS

## 2013-11-18 MED ORDER — TECHNETIUM TC 99M MEBROFENIN IV KIT
5.0000 | PACK | Freq: Once | INTRAVENOUS | Status: AC | PRN
  Administered 2013-11-18: 5 via INTRAVENOUS

## 2013-11-18 MED ORDER — MIDAZOLAM HCL 2 MG/2ML IJ SOLN
INTRAMUSCULAR | Status: AC
  Filled 2013-11-18: qty 2

## 2013-11-18 MED ORDER — FENTANYL CITRATE 0.05 MG/ML IJ SOLN
100.0000 ug | INTRAMUSCULAR | Status: AC | PRN
  Administered 2013-11-18 (×2): 100 ug via INTRAVENOUS
  Filled 2013-11-18 (×2): qty 2

## 2013-11-18 MED ORDER — PANTOPRAZOLE SODIUM 40 MG IV SOLR
40.0000 mg | Freq: Every day | INTRAVENOUS | Status: DC
  Administered 2013-11-18: 40 mg via INTRAVENOUS
  Filled 2013-11-18: qty 40

## 2013-11-18 MED ORDER — SODIUM CHLORIDE 0.9 % IV SOLN
INTRAVENOUS | Status: DC | PRN
  Administered 2013-11-18: 21:00:00

## 2013-11-18 MED ORDER — SIMETHICONE 40 MG/0.6ML PO SUSP
ORAL | Status: DC | PRN
  Administered 2013-11-18: 21:00:00

## 2013-11-18 MED ORDER — STERILE WATER FOR INJECTION IJ SOLN
INTRAMUSCULAR | Status: AC
Start: 2013-11-18 — End: 2013-11-19
  Filled 2013-11-18: qty 10

## 2013-11-18 MED ORDER — PROPOFOL 10 MG/ML IV BOLUS
INTRAVENOUS | Status: DC | PRN
  Administered 2013-11-18: 200 mg via INTRAVENOUS
  Administered 2013-11-18: 50 mg via INTRAVENOUS

## 2013-11-18 MED ORDER — KETAMINE HCL 10 MG/ML IJ SOLN
INTRAMUSCULAR | Status: AC
  Filled 2013-11-18: qty 1

## 2013-11-18 MED ORDER — FENTANYL CITRATE 0.05 MG/ML IJ SOLN
100.0000 ug | Freq: Once | INTRAMUSCULAR | Status: AC
  Administered 2013-11-18: 100 ug via INTRAVENOUS
  Filled 2013-11-18: qty 2

## 2013-11-18 MED ORDER — GLUCAGON HCL (RDNA) 1 MG IJ SOLR
INTRAMUSCULAR | Status: AC
  Administered 2013-11-18: 0.25 mg via INTRAVENOUS
  Filled 2013-11-18: qty 2

## 2013-11-18 MED ORDER — ONDANSETRON HCL 4 MG/2ML IJ SOLN
4.0000 mg | INTRAMUSCULAR | Status: AC | PRN
  Administered 2013-11-18 (×2): 4 mg via INTRAVENOUS
  Filled 2013-11-18 (×3): qty 2

## 2013-11-18 MED ORDER — SODIUM CHLORIDE 0.9 % IV SOLN
INTRAVENOUS | Status: DC
  Administered 2013-11-18 – 2013-11-24 (×4): via INTRAVENOUS

## 2013-11-18 MED ORDER — LIDOCAINE HCL (CARDIAC) 10 MG/ML IV SOLN
INTRAVENOUS | Status: DC | PRN
  Administered 2013-11-18: 20 mg via INTRAVENOUS

## 2013-11-18 MED ORDER — DICYCLOMINE HCL 10 MG/ML IM SOLN
20.0000 mg | Freq: Once | INTRAMUSCULAR | Status: AC
  Administered 2013-11-18: 20 mg via INTRAMUSCULAR
  Filled 2013-11-18: qty 2

## 2013-11-18 MED ORDER — SUCCINYLCHOLINE CHLORIDE 20 MG/ML IJ SOLN
INTRAMUSCULAR | Status: DC | PRN
  Administered 2013-11-18: 140 mg via INTRAVENOUS

## 2013-11-18 MED ORDER — SODIUM CHLORIDE 0.9 % IV BOLUS (SEPSIS)
1000.0000 mL | Freq: Once | INTRAVENOUS | Status: AC
  Administered 2013-11-18: 1000 mL via INTRAVENOUS

## 2013-11-18 MED ORDER — ROCURONIUM BROMIDE 50 MG/5ML IV SOLN
INTRAVENOUS | Status: AC
  Filled 2013-11-18: qty 1

## 2013-11-18 MED ORDER — HYDROMORPHONE HCL PF 2 MG/ML IJ SOLN
2.0000 mg | Freq: Once | INTRAMUSCULAR | Status: AC
  Administered 2013-11-18: 2 mg via INTRAVENOUS
  Filled 2013-11-18: qty 1

## 2013-11-18 MED ORDER — NEOSTIGMINE METHYLSULFATE 1 MG/ML IJ SOLN
INTRAMUSCULAR | Status: DC | PRN
  Administered 2013-11-18: 2 mg via INTRAVENOUS

## 2013-11-18 MED ORDER — SODIUM CHLORIDE 0.9 % IV SOLN
INTRAVENOUS | Status: AC
  Filled 2013-11-18: qty 50

## 2013-11-18 MED ORDER — LEVOFLOXACIN IN D5W 500 MG/100ML IV SOLN
INTRAVENOUS | Status: AC
  Filled 2013-11-18: qty 100

## 2013-11-18 MED ORDER — ONDANSETRON HCL 4 MG/2ML IJ SOLN: 4.0000 mg | Freq: Three times a day (TID) | INTRAMUSCULAR | Status: DC | PRN

## 2013-11-18 MED ORDER — HYDROMORPHONE HCL PF 1 MG/ML IJ SOLN
1.0000 mg | INTRAMUSCULAR | Status: DC | PRN
  Administered 2013-11-18: 1 mg via INTRAVENOUS

## 2013-11-18 MED ORDER — HYDROMORPHONE HCL PF 1 MG/ML IJ SOLN
1.0000 mg | INTRAMUSCULAR | Status: AC | PRN
  Administered 2013-11-18 (×2): 1 mg via INTRAVENOUS
  Filled 2013-11-18 (×2): qty 1

## 2013-11-18 MED ORDER — FAMOTIDINE IN NACL 20-0.9 MG/50ML-% IV SOLN
20.0000 mg | Freq: Once | INTRAVENOUS | Status: AC
  Administered 2013-11-18: 20 mg via INTRAVENOUS
  Filled 2013-11-18: qty 50

## 2013-11-18 MED ORDER — HEPARIN SODIUM (PORCINE) 5000 UNIT/ML IJ SOLN: 5000.0000 [IU] | Freq: Three times a day (TID) | INTRAMUSCULAR | Status: DC

## 2013-11-18 MED ORDER — KETAMINE HCL 10 MG/ML IJ SOLN
INTRAMUSCULAR | Status: DC | PRN
  Administered 2013-11-18 (×2): 20 mg via INTRAVENOUS
  Administered 2013-11-18: 10 mg via INTRAVENOUS

## 2013-11-18 SURGICAL SUPPLY — 29 items
BAG HAMPER (MISCELLANEOUS) ×2 IMPLANT
BALLN RETRIEVAL 12X15 (BALLOONS) IMPLANT
BASKET TRAPEZOID 3X6 (MISCELLANEOUS) IMPLANT
BLOCK BITE 60FR ADLT L/F BLUE (MISCELLANEOUS) ×2 IMPLANT
BSKT STON RTRVL TRAPEZOID 3X6 (MISCELLANEOUS)
DEVICE INFLATION ENCORE 26 (MISCELLANEOUS) IMPLANT
DEVICE LOCKING W-BIOPSY CAP (MISCELLANEOUS) ×2 IMPLANT
GUIDEWIRE HYDRA JAGWIRE .35 (WIRE) IMPLANT
GUIDEWIRE JAG HINI 025X260CM (WIRE) IMPLANT
KIT CLEAN ENDO COMPLIANCE (KITS) ×2 IMPLANT
KIT ROOM TURNOVER APOR (KITS) ×2 IMPLANT
LUBRICANT JELLY 4.5OZ STERILE (MISCELLANEOUS) ×2 IMPLANT
NEEDLE HYPO 18GX1.5 BLUNT FILL (NEEDLE) IMPLANT
PAD ARMBOARD 7.5X6 YLW CONV (MISCELLANEOUS) ×2 IMPLANT
PATHFINDER 450CM 0.18 (STENTS) IMPLANT
POSITIONER HEAD 8X9X4 ADT (SOFTGOODS) IMPLANT
SNARE ROTATE MED OVAL 20MM (MISCELLANEOUS) IMPLANT
SNARE SHORT THROW 13M SML OVAL (MISCELLANEOUS) ×2 IMPLANT
SPHINCTEROTOME AUTOTOME .25 (MISCELLANEOUS) IMPLANT
SPHINCTEROTOME HYDRATOME 44 (MISCELLANEOUS) ×2 IMPLANT
SPONGE GAUZE 4X4 12PLY (GAUZE/BANDAGES/DRESSINGS) ×2 IMPLANT
STENT RX PRELOADED 10FRX7CM (Stent) ×2 IMPLANT
SYR 3ML LL SCALE MARK (SYRINGE) ×2 IMPLANT
SYR 50ML LL SCALE MARK (SYRINGE) ×4 IMPLANT
SYSTEM CONTINUOUS INJECTION (MISCELLANEOUS) ×2 IMPLANT
TUBING ENDO SMARTCAP PENTAX (MISCELLANEOUS) ×2 IMPLANT
WALLSTENT METAL COVERED 10X60 (STENTS) IMPLANT
WALLSTENT METAL COVERED 10X80 (STENTS) IMPLANT
WATER STERILE IRR 1000ML POUR (IV SOLUTION) ×4 IMPLANT

## 2013-11-18 NOTE — Transfer of Care (Signed)
Immediate Anesthesia Transfer of Care Note  Patient: Catherine Ramirez  Procedure(s) Performed: Procedure(s): ENDOSCOPIC RETROGRADE CHOLANGIOPANCREATOGRAPHY (ERCP) WITH SMALL SPHINCTEROTOMY (N/A)  Patient Location: PACU  Anesthesia Type:General  Level of Consciousness: sedated and patient cooperative  Airway & Oxygen Therapy: Patient Spontanous Breathing and non-rebreather face mask  Post-op Assessment: Report given to PACU RN, Post -op Vital signs reviewed and stable and Patient moving all extremities  Post vital signs: Reviewed and stable  Complications: No apparent anesthesia complications

## 2013-11-18 NOTE — ED Notes (Signed)
Pt states she had her gallbladder removed last Thursday. Complain of sudden onset of abdominal pain that started around 0900 today.

## 2013-11-18 NOTE — Consult Note (Signed)
Referring Provider: No ref. provider found Primary Care Physician:  [name], MD Primary Gastroenterologist:  Dr. Laural Golden  Reason for Consultation:    Biliary leak.  HPI:   Patient is a 28 year old Caucasian female who underwent laparoscopic cholecystectomy for symptomatic cholelithiasis on 11/10/2013. She had JP drain which was removed 24 hours later. Postop recovery was slow with issues with pain. LFTs are normal on 11/11/2013 except AST was 40 and albumin 2.8. She was finally able to go home on 11/15/2013 and was doing well. She had slight abdominal soreness yesterday she was able to function. This morning she began to experience right upper quadrant pain radiating posteriorly and down into her right lower quadrant. She was seen by Dr. [name] and given prescription for dicyclomine. Around lunch time she ate half a bowl of cereal and within minutes developed excruciating pain. Patient was brought to the emergency room and noted to have elevated transaminases and alkaline phosphatase. Ultrasound revealed prominent central intrahepatic ducts but CBD and CHD were nondilated. She subsequently had hepatobiliary scan which reveals biliary leak. Patient was hospitalized and begun on pain medication and GI consultation requested for ERCP. Patient complains of severe constant pain in right upper quadrant radiating posteriorly and down into the right lower quadrant. Pain is worse when she takes deep breath. She said diaphoreses and her husband think she may have had low-grade temperature. She denies chest pain.   Past Medical History  Diagnosis Date  . Arrhythmia   . Chronic pain syndrome   . IBS (irritable bowel syndrome)   . Chronic constipation   . Chronic pelvic pain in female     Past Surgical History  Procedure Laterality Date  . Cesarean section    . Pelvic fracture surgery    . Tonsillectomy    . Tubal ligation    . Ovarian cyst removal Left   . Laparoscopic unilateral salpingo  oopherectomy Left 10/11/2013    Procedure:  LAPAROSCOPIC LEFT SALPINGO OOPHORECTOMY;  Surgeon: [name], MD;  Location: [facility];  Service: Gynecology;  Laterality: Left;  . Bilateral salpingectomy Right 10/11/2013    Procedure: RIGHT SALPINGECTOMY;  Surgeon: [name], MD;  Location: [facility];  Service: Gynecology;  Laterality: Right;  . Diagnostic laparoscopy with removal of ectopic pregnancy N/A 10/15/2013    Procedure: DIAGNOSTIC LAPAROSCOPY ;  Surgeon: [name], MD;  Location: [facility];  Service: Gynecology;  Laterality: N/A;  . Cholecystectomy N/A 11/10/2013    Procedure: LAPAROSCOPIC CHOLECYSTECTOMY;  Surgeon: Scherry Ran, MD;  Location: [facility];  Service: General;  Laterality: N/A;    Prior to Admission medications   Medication Sig Start Date End Date Taking? Authorizing Provider  Biotin 1000 MCG tablet Take 1,000 mcg by mouth daily.   Yes Historical Provider, MD  docusate sodium 100 MG CAPS Take 100 mg by mouth 2 (two) times daily. 11/14/13  Yes Scherry Ran, MD  EPINEPHrine (EPIPEN) 0.3 mg/0.3 mL DEVI Inject 0.3 mg into the muscle once.   Yes Historical Provider, MD  ibuprofen (ADVIL,MOTRIN) 200 MG tablet Take 400 mg by mouth every 8 (eight) hours as needed for moderate pain.   Yes Historical Provider, MD  Linaclotide Rolan Lipa) 145 MCG CAPS capsule Take 1 capsule (145 mcg total) by mouth daily. 10/18/13  Yes [name], MD  polyethylene glycol [facility] / GLYCOLAX) packet Take 17 g by mouth daily. 11/14/13  Yes Scherry Ran, MD    Current Facility-Administered Medications  Medication Dose Route Frequency Provider  Last Rate Last Dose  . 0.9 %  sodium chloride infusion   Intravenous Continuous Alfonzo Feller, DO      . glucagon (GLUCAGEN) 1 MG injection           . HYDROmorphone (DILAUDID) injection 2 mg  2 mg Intravenous Q2H PRN Doree Albee, MD   2 mg at 11/18/13 1845  . levofloxacin (LEVAQUIN) IVPB 500 mg  500 mg Intravenous Once Rogene Houston, MD      . sodium chloride 0.9 % infusion           . sterile water (preservative free) injection            Facility-Administered Medications Ordered in Other Encounters  Medication Dose Route Frequency Provider Last Rate Last Dose  . dextrose 5 %-0.45 % sodium chloride infusion   Intravenous Continuous [name], MD 100 mL/hr at 10/12/13 1714    . pneumococcal 23 valent vaccine (PNU-IMMUNE) injection 0.5 mL  0.5 mL Intramuscular Tomorrow-1000 [name], MD        Allergies as of 11/18/2013 - Review Complete 11/18/2013  Allergen Reaction Noted  . Amoxicillin Anaphylaxis, Hives, and Rash 04/05/2008  . Bee venom Anaphylaxis 01/28/2012  . Shellfish allergy Anaphylaxis 01/28/2012  . Iodine Rash 10/11/2013  . Other Rash 01/28/2012  . Penicillins Hives and Rash     No family history on file.  History   Social History  . Marital Status: Married    Spouse Name: N/A    Number of Children: N/A  . Years of Education: N/A   Occupational History  . Not on file.   Social History Main Topics  . Smoking status: Current Every Day Smoker -- 1.00 packs/day for 2 years    Types: Cigarettes  . Smokeless tobacco: Never Used  . Alcohol Use: Yes     Comment: socially  . Drug Use: No  . Sexual Activity: Yes    Birth Control/ Protection: None   Other Topics Concern  . Not on file   Social History Narrative  . No narrative on file    Review of Systems: See HPI, otherwise normal ROS  Physical Exam: Temp:  [97.7 F (36.5 C)-98.4 F (36.9 C)] 98.4 F (36.9 C) (04/17 1825) Pulse Rate:  [81-112] 100 (04/17 1825) Resp:  [15-38] 15 (04/17 1600) BP: (76-136)/(59-90) 136/68 mmHg (04/17 1825) SpO2:  [96 %-98 %] 98 % (04/17 1825) Weight:  [140 lb (63.504 kg)-143 lb 1.3 oz (64.9 kg)] 143 lb 1.3 oz (64.9 kg) (04/17 1825)   Patient is alert and appears to be in pain. Conjunctivae was pink. Sclerae nonicteric.  Oropharyngeal mucosa is normal. No neck masses or thyromegaly  noted. Cardiac exam with a regular rhythm normal S1 and S2. No murmur or gallop noted. Lungs are clear to auscultation. Abdomen is symmetrical with well-healed laparoscopy scars. Bowel sounds are hypoactive. Abdomen is soft with mild tenderness in right lower quadrant and moderate tenderness with guarding in the right upper quadrant. No organomegaly or masses. No peripheral edema or clubbing noted.    Lab Results:  Recent Labs  11/18/13 1353  WBC 17.2*  HGB 12.1  HCT 35.5*  PLT 465*   BMET  Recent Labs  11/18/13 1353  NA 138  K 4.1  CL 97  CO2 25  GLUCOSE 130*  BUN 19  CREATININE 0.64  CALCIUM 9.9   LFT  Recent Labs  11/18/13 1353  PROT 8.6*  ALBUMIN 3.8  AST 150*  ALT 188*  ALKPHOS 326*  BILITOT 1.1   PT/INR No results found for this basename: LABPROT, INR,  in the last 72 hours Hepatitis Panel No results found for this basename: HEPBSAG, HCVAB, HEPAIGM, HEPBIGM,  in the last 72 hours  Studies/Results: Nm Hepatobiliary Liver Func  11/18/2013   CLINICAL DATA:  Right upper quadrant pain. Cholecystectomy 11/10/2013.  EXAM: NUCLEAR MEDICINE HEPATOBILIARY IMAGING  TECHNIQUE: Sequential images of the abdomen were obtained out to 60 minutes following intravenous administration of radiopharmaceutical.  RADIOPHARMACEUTICALS:  5.0 mCi mCi Tc-33m Choletec  COMPARISON:  No comparisons  FINDINGS: There is prompt uptake and excretion of radiotracer by the liver. There is radiotracer accumulation in the region of the gallbladder fossa which is later noted to spread in the abdomen. Findings are compatible with a biliary leak in the gallbladder fossa.  IMPRESSION: Biliary leak in the gallbladder fossa.  These results were called by telephone at the time of interpretation on 11/18/2013 at 6:50 PM to Dr. Anastasio Champion who verbally acknowledged these results.   Electronically Signed   By: Rolm Baptise M.D.   On: 11/18/2013 18:58   Dg Abd Acute W/chest  11/18/2013   CLINICAL DATA:   Abdomen pain  EXAM: ACUTE ABDOMEN SERIES (ABDOMEN 2 VIEW & CHEST 1 VIEW)  COMPARISON:  None.  FINDINGS: There is no evidence of dilated bowel loops or free intraperitoneal air. Bowel content is noted throughout colon to No radiopaque calculi or other significant radiographic abnormality is seen. Patient status post prior fixation of the right pelvic bone. Prior cholecystectomy clips are noted. Heart size and mediastinal contours are within normal limits. Both lungs are clear.  IMPRESSION: Negative abdominal radiographs. Constipation. No acute cardiopulmonary disease.   Electronically Signed   By: Abelardo Diesel M.D.   On: 11/18/2013 15:58   US Abdomen Limited Ruq  11/18/2013   CLINICAL DATA:  Elevated liver function tests. Recent cholecystectomy with continued right upper quadrant pain.  EXAM: US ABDOMEN LIMITED - RIGHT UPPER QUADRANT  COMPARISON:  US ABDOMEN LIMITED RUQ/ASCITES dated 11/14/2013; CT ABD - PELV W/ CM dated 10/08/2013  FINDINGS: Gallbladder:  Surgically absent with small amount of fluid in the gallbladder fossa.  Common bile duct:  Diameter: 5 mm.  No visualized focal filling defect or cut off.  Liver:  Minimal central intrahepatic ductal prominence.  IMPRESSION: Presumed postsurgical evolution of gallbladder fossa appearance.  No common duct dilatation. Mild prominence of the central intrahepatic ducts is noted. MRCP with contrast could be helpful for further evaluation if symptoms continue.  These results were called by telephone at the time of interpretation on 11/18/2013 at 3:21 PM to Dr. Francine Graven , who verbally acknowledged these results.   Electronically Signed   By: Conchita Paris M.D.   On: 11/18/2013 15:21    Please note I have reviewed imaging studies.  Assessment; Patient is 28 year old Caucasian female who is status post laparoscopic cholecystectomy on 11/10/2013 for symptomatic cholelithiasis. Patient presented sudden onset of right-sided abdominal pain and HIDA scan reveals  biliary leak. Patient had a JP drain postprocedure which was removed within 24 hours and she did fine until today. Transaminases are elevated. Therefore it is possible that she has common duct stone resulting in obstruction and leak as a result. Suspect duct of Luschka leak based on review of HIDA scan.  Recommendations; Levaquin 500 mg IV x1. ERCP with biliary stenting this evening. I reviewed procedure and risks with the patient, her husband Ysidro Evert and her mother and they  are all agreeable. I also explained to them that pancreatic stenting may be undertaken for prophylaxis.   LOS: 0 days   Najeeb U Rehman  11/18/2013, 8:00 PM

## 2013-11-18 NOTE — H&P (Addendum)
[facility] History and Physical  Catherine Ramirez CHE:[id number] DOB: 22-Sep-1985 DOA: 11/18/2013  Referring physician: Dr. Thurnell Garbe, ER. PCP: Catherine Fire, MD   Chief Complaint: Right upper quadrant abdominal pain.  HPI: Catherine Ramirez is a 28 y.o. female  This is a 28 year old lady who underwent laparoscopic cholecystectomy and was discharged just 4 days ago. Postoperatively she was in severe pain and required a PCA,, eventually this was able to be discontinued. She does have a history of chronic pain syndrome. She has irritable bowel syndrome also with constipation. She now comes in with right upper quadrant abdominal pain which started this morning apparently. There was associated vomiting 1 time. She says that she is in severe pain. She went to see her surgeon, Dr. Romona Ramirez, who performed the surgery and his evaluation did not indicate anything acute but he did tell her to come to the hospital if she did not improve. He had prescribed for her Bentyl and this has not helped her. In the emergency room, she was found to have elevated liver enzymes and now she is being admitted for further investigation and management. She does not feel feverish.   Review of Systems:  Constitutional:  No weight loss, night sweats, Fevers, chills. HEENT:  No headaches, Difficulty swallowing,Tooth/dental problems,Sore throat,  No sneezing, itching, ear ache, nasal congestion, post nasal drip,  Cardio-vascular:  No chest pain, Orthopnea, PND, swelling in lower extremities, anasarca, dizziness, palpitations   Resp:  No shortness of breath with exertion or at rest. No excess mucus, no productive cough, No non-productive cough, No coughing up of blood.No change in color of mucus.No wheezing.No chest wall deformity  Skin:  no rash or lesions.  GU:  no dysuria, change in color of urine, no urgency or frequency. No flank pain.  Musculoskeletal:  No joint pain or swelling. No decreased range of motion.  No back pain.  Psych:  No change in mood or affect. No depression or anxiety. No memory loss.   Past Medical History  Diagnosis Date  . Arrhythmia   . Chronic pain syndrome   . IBS (irritable bowel syndrome)   . Chronic constipation   . Chronic pelvic pain in female    Past Surgical History  Procedure Laterality Date  . Cesarean section    . Pelvic fracture surgery    . Tonsillectomy    . Tubal ligation    . Ovarian cyst removal Left   . Laparoscopic unilateral salpingo oopherectomy Left 10/11/2013    Procedure:  LAPAROSCOPIC LEFT SALPINGO OOPHORECTOMY;  Surgeon: [name], MD;  Location: [facility];  Service: Gynecology;  Laterality: Left;  . Bilateral salpingectomy Right 10/11/2013    Procedure: RIGHT SALPINGECTOMY;  Surgeon: [name], MD;  Location: [facility];  Service: Gynecology;  Laterality: Right;  . Diagnostic laparoscopy with removal of ectopic pregnancy N/A 10/15/2013    Procedure: DIAGNOSTIC LAPAROSCOPY ;  Surgeon: [name], MD;  Location: [facility];  Service: Gynecology;  Laterality: N/A;  . Cholecystectomy N/A 11/10/2013    Procedure: LAPAROSCOPIC CHOLECYSTECTOMY;  Surgeon: Scherry Ran, MD;  Location: [facility];  Service: General;  Laterality: N/A;   Social History:  reports that she has been smoking Cigarettes.  She has a 2 pack-year smoking history. She has never used smokeless tobacco. She reports that she drinks alcohol. She reports that she does not use illicit drugs.  Allergies  Allergen Reactions  . Amoxicillin Anaphylaxis, Hives and Rash    Able to take Ancef as  prophylaxis without reaction  . Bee Venom Anaphylaxis  . Shellfish Allergy Anaphylaxis  . Iodine Rash  . Other Rash    GRASS  . Penicillins Hives and Rash    No family history on file.   Prior to Admission medications   Medication Sig Start Date End Date Taking? Authorizing Provider  Biotin 1000 MCG tablet Take 1,000 mcg by mouth daily.   Yes Historical Provider, MD  docusate sodium  100 MG CAPS Take 100 mg by mouth 2 (two) times daily. 11/14/13  Yes Scherry Ran, MD  EPINEPHrine (EPIPEN) 0.3 mg/0.3 mL DEVI Inject 0.3 mg into the muscle once.   Yes Historical Provider, MD  ibuprofen (ADVIL,MOTRIN) 200 MG tablet Take 400 mg by mouth every 8 (eight) hours as needed for moderate pain.   Yes Historical Provider, MD  Linaclotide Rolan Lipa) 145 MCG CAPS capsule Take 1 capsule (145 mcg total) by mouth daily. 10/18/13  Yes [name], MD  polyethylene glycol [facility] / GLYCOLAX) packet Take 17 g by mouth daily. 11/14/13  Yes Scherry Ran, MD   Physical Exam: Filed Vitals:   11/18/13 1556  BP:   Pulse: 91  Temp:   Resp: 19    BP 136/90  Pulse 91  Temp(Src) 97.7 F (36.5 C) (Oral)  Resp 19  Ht 5\' 6"  (1.676 m)  Wt 63.504 kg (140 lb)  BMI 22.61 kg/m2  SpO2 96%  LMP 11/11/2013  General: She appears very uncomfortable and in pain. She is able to tell me a story regarding her pain. Eyes: PERRL, normal lids, irises & conjunctiva ENT: grossly normal hearing, lips & tongue Neck: no LAD, masses or thyromegaly Cardiovascular: RRR, no m/r/g. No LE edema. Telemetry: SR, no arrhythmias  Respiratory: CTA bilaterally, no w/r/r. Normal respiratory effort. Abdomen: soft. Her abdomen is somewhat difficult to assess as she seems to be in severe pain. Bowel sounds are I believe present but scanty. She did become in pain when my stethoscope pressed onto her right lower quadrant, away from the site of pain. Despite the severe pain, she does not seem to have a resting tachycardia. Skin: no rash or induration seen on limited exam Musculoskeletal: grossly normal tone BUE/BLE Psychiatric: Very anxious. Neurologic: grossly non-focal.          Labs on Admission:  Basic Metabolic Panel:  Recent Labs Lab 11/12/13 0457 11/18/13 1353  NA 142 138  K 4.0 4.1  CL 107 97  CO2 26 25  GLUCOSE 103* 130*  BUN 7 19  CREATININE 0.67 0.64  CALCIUM 8.0* 9.9   Liver Function  Tests:  Recent Labs Lab 11/18/13 1353  AST 150*  ALT 188*  ALKPHOS 326*  BILITOT 1.1  PROT 8.6*  ALBUMIN 3.8    Recent Labs Lab 11/18/13 1353  LIPASE 17   No results found for this basename: AMMONIA,  in the last 168 hours CBC:  Recent Labs Lab 11/12/13 0457 11/13/13 0423 11/18/13 1353  WBC 7.4 9.1 17.2*  NEUTROABS  --  5.4 10.5*  HGB 9.2* 9.5* 12.1  HCT 26.9* 28.5* 35.5*  MCV 92.4 92.2 90.3  PLT 185 202 465*   Cardiac Enzymes: No results found for this basename: CKTOTAL, CKMB, CKMBINDEX, TROPONINI,  in the last 168 hours  BNP (last 3 results) No results found for this basename: PROBNP,  in the last 8760 hours CBG: No results found for this basename: GLUCAP,  in the last 168 hours  Radiological Exams on Admission: Dg Abd Acute  W/chest  11/18/2013   CLINICAL DATA:  Abdomen pain  EXAM: ACUTE ABDOMEN SERIES (ABDOMEN 2 VIEW & CHEST 1 VIEW)  COMPARISON:  None.  FINDINGS: There is no evidence of dilated bowel loops or free intraperitoneal air. Bowel content is noted throughout colon to No radiopaque calculi or other significant radiographic abnormality is seen. Patient status post prior fixation of the right pelvic bone. Prior cholecystectomy clips are noted. Heart size and mediastinal contours are within normal limits. Both lungs are clear.  IMPRESSION: Negative abdominal radiographs. Constipation. No acute cardiopulmonary disease.   Electronically Signed   By: Abelardo Diesel M.D.   On: 11/18/2013 15:58   US Abdomen Limited Ruq  11/18/2013   CLINICAL DATA:  Elevated liver function tests. Recent cholecystectomy with continued right upper quadrant pain.  EXAM: US ABDOMEN LIMITED - RIGHT UPPER QUADRANT  COMPARISON:  US ABDOMEN LIMITED RUQ/ASCITES dated 11/14/2013; CT ABD - PELV W/ CM dated 10/08/2013  FINDINGS: Gallbladder:  Surgically absent with small amount of fluid in the gallbladder fossa.  Common bile duct:  Diameter: 5 mm.  No visualized focal filling defect or cut off.   Liver:  Minimal central intrahepatic ductal prominence.  IMPRESSION: Presumed postsurgical evolution of gallbladder fossa appearance.  No common duct dilatation. Mild prominence of the central intrahepatic ducts is noted. MRCP with contrast could be helpful for further evaluation if symptoms continue.  These results were called by telephone at the time of interpretation on 11/18/2013 at 3:21 PM to Dr. Francine Graven , who verbally acknowledged these results.   Electronically Signed   By: Conchita Paris M.D.   On: 11/18/2013 15:21      Assessment/Plan   1. Right upper quadrant abdominal pain with abnormal liver enzymes, concerning for postoperative complications. It is difficult to assess in this patient whether she has an acute abdomen because of her history of chronic pain syndrome, however she does have an elevated white count and abnormal liver enzymes and my examination is pointing towards peritoneal irritation. 2. Chronic pain syndrome. 3. Elevated liver enzymes and leukocytosis, related to #1.  Plan: 1. Admit to medical floor. 2. Proceed with HIDA scan to determine whether she has a biliary leak. 3. If she does not have a biliary leak, she will need a CT scan of the abdomen tonight. 4. Gastroenterology consultation appreciated. I discussed this case with Dr. Sydell Axon, who agrees with above and recommends an ERCP if she does have a biliary leak with stent placement.  Further recommendations will depend on patient's hospital progress.   Code Status: Full code.  Family Communication: I discussed the plan with patient at the bedside.   Disposition Plan: Home when medically stable.   Time spent: 45 minutes.  [facility] Pager [phone].

## 2013-11-18 NOTE — Anesthesia Preprocedure Evaluation (Addendum)
Anesthesia Evaluation  Patient identified by MRN, date of birth, ID band Patient awake    Reviewed: Allergy & Precautions, H&P , NPO status , Patient's Chart, lab work & pertinent test results, reviewed documented beta blocker date and time   History of Anesthesia Complications (+) DIFFICULT AIRWAY and Emergence Delirium  Airway Mallampati: II TM Distance: >3 FB Neck ROM: Full    Dental  (+) Teeth Intact   Pulmonary Current Smoker,  breath sounds clear to auscultation        Cardiovascular Exercise Tolerance: Good + dysrhythmias Rhythm:Regular Rate:Normal     Neuro/Psych Anxiety    GI/Hepatic Vomit x1 this afternoon   Endo/Other    Renal/GU      Musculoskeletal   Abdominal (+)  Abdomen: soft. Bowel sounds: normal.  Peds  Hematology  (+) anemia ,   Anesthesia Other Findings   Reproductive/Obstetrics                          Anesthesia Physical  Anesthesia Plan  ASA: II and emergent  Anesthesia Plan: General   Post-op Pain Management:    Induction: Intravenous, Rapid sequence and Cricoid pressure planned  Airway Management Planned: Oral ETT  Additional Equipment:   Intra-op Plan:   Post-operative Plan: Extubation in OR  Informed Consent: I have reviewed the patients History and Physical, chart, labs and discussed the procedure including the risks, benefits and alternatives for the proposed anesthesia with the patient or authorized representative who has indicated his/her understanding and acceptance.   Dental advisory given  Plan Discussed with: Surgeon  Anesthesia Plan Comments:        Anesthesia Quick Evaluation

## 2013-11-18 NOTE — ED Provider Notes (Signed)
CSN: [id number]     Arrival date & time 11/18/13  1329 History   First MD Initiated Contact with Patient 11/18/13 1341     Chief Complaint  Patient presents with  . Abdominal Pain      HPI Pt was seen at 1345.  Per pt, c/o sudden onset and worsening of constant RUQ abd "pain" that began this morning.  Has been associated with multiple intermittent episodes of N/V.  Describes the abd pain as "sore" and "aching," with radiation into her right flank. Pt is s/p cholecystectomy last week, discharged from the hospital on 11/14/13. States she was evaluated by her General Surgeon today PTA, then sent to the ED for further evaluation. Denies N/V/D, no fevers, no back pain, no rash, no CP/SOB, no black or blood in stools, no dysuria/hematuria.       Past Medical History  Diagnosis Date  . Arrhythmia   . Chronic pain syndrome   . IBS (irritable bowel syndrome)   . Chronic constipation   . Chronic pelvic pain in female    Past Surgical History  Procedure Laterality Date  . Cesarean section    . Pelvic fracture surgery    . Tonsillectomy    . Tubal ligation    . Ovarian cyst removal Left   . Laparoscopic unilateral salpingo oopherectomy Left 10/11/2013    Procedure:  LAPAROSCOPIC LEFT SALPINGO OOPHORECTOMY;  Surgeon: [name], MD;  Location: [facility];  Service: Gynecology;  Laterality: Left;  . Bilateral salpingectomy Right 10/11/2013    Procedure: RIGHT SALPINGECTOMY;  Surgeon: [name], MD;  Location: [facility];  Service: Gynecology;  Laterality: Right;  . Diagnostic laparoscopy with removal of ectopic pregnancy N/A 10/15/2013    Procedure: DIAGNOSTIC LAPAROSCOPY ;  Surgeon: [name], MD;  Location: [facility];  Service: Gynecology;  Laterality: N/A;  . Cholecystectomy N/A 11/10/2013    Procedure: LAPAROSCOPIC CHOLECYSTECTOMY;  Surgeon: Scherry Ran, MD;  Location: [facility];  Service: General;  Laterality: N/A;    History  Substance Use Topics  . Smoking status: Current Every  Day Smoker -- 1.00 packs/day for 2 years    Types: Cigarettes  . Smokeless tobacco: Never Used  . Alcohol Use: Yes     Comment: socially    Review of Systems ROS: Statement: All systems negative except as marked or noted in the HPI; Constitutional: Negative for fever and chills. ; ; Eyes: Negative for eye pain, redness and discharge. ; ; ENMT: Negative for ear pain, hoarseness, nasal congestion, sinus pressure and sore throat. ; ; Cardiovascular: Negative for chest pain, palpitations, diaphoresis, dyspnea and peripheral edema. ; ; Respiratory: Negative for cough, wheezing and stridor. ; ; Gastrointestinal: +abd pain. Negative for nausea, vomiting, diarrhea, blood in stool, hematemesis, jaundice and rectal bleeding. . ; ; Genitourinary: Negative for dysuria, flank pain and hematuria. ; ; Musculoskeletal: Negative for back pain and neck pain. Negative for swelling and trauma.; ; Skin: Negative for pruritus, rash, abrasions, blisters, bruising and skin lesion.; ; Neuro: Negative for headache, lightheadedness and neck stiffness. Negative for weakness, altered level of consciousness , altered mental status, extremity weakness, paresthesias, involuntary movement, seizure and syncope.      Allergies  Amoxicillin; Bee venom; Shellfish allergy; Iodine; Other; and Penicillins  Home Medications   Prior to Admission medications   Medication Sig Start Date End Date Taking? Authorizing Provider  Biotin 1000 MCG tablet Take 1,000 mcg by mouth daily.    Historical Provider, MD  docusate  sodium 100 MG CAPS Take 100 mg by mouth 2 (two) times daily. 11/14/13   Scherry Ran, MD  EPINEPHrine (EPIPEN) 0.3 mg/0.3 mL DEVI Inject 0.3 mg into the muscle once.    Historical Provider, MD  Linaclotide Rolan Lipa) 145 MCG CAPS capsule Take 1 capsule (145 mcg total) by mouth daily. 10/18/13   [name], MD  polyethylene glycol [facility] / Floria Raveling) packet Take 17 g by mouth daily. 11/14/13   Scherry Ran, MD    BP 116/66  Pulse 99  Temp(Src) 97.7 F (36.5 C) (Oral)  Resp 20  Ht 5\' 6"  (1.676 m)  Wt 140 lb (63.504 kg)  BMI 22.61 kg/m2  SpO2 96%  LMP 11/11/2013 Physical Exam 1350: Physical examination:  Nursing notes reviewed; Vital signs and O2 SAT reviewed;  Constitutional: Well developed, Well nourished, Well hydrated, Uncomfortable appearing.;; Head:  Normocephalic, atraumatic; Eyes: EOMI, PERRL, No scleral icterus; ENMT: Mouth and pharynx normal, Mucous membranes moist; Neck: Supple, Full range of motion, No lymphadenopathy; Cardiovascular: Tachycardic rate and rhythm, No gallop; Respiratory: Breath sounds clear & equal bilaterally, No rales, rhonchi, wheezes.  Speaking full sentences with ease, Normal respiratory effort/excursion; Chest: Nontender, Movement normal; Abdomen: Soft, +RUQ tenderness to palp. RUQ with healing surgical wounds on abd wall, no drainage, no erythema. Nondistended, Normal bowel sounds; Genitourinary: No CVA tenderness; Extremities: Pulses normal, No tenderness, No edema, No calf edema or asymmetry.; Neuro: AA&Ox3, Major CN grossly intact.  Speech clear. No gross focal motor or sensory deficits in extremities.; Skin: Color normal, Warm, Dry.    ED Course  Procedures   [id number]:  T/C to General Surgery Dr. [name], case discussed, including:  HPI, pertinent PM/SHx, VS/PE, dx testing, ED course and treatment:  States he has already evaluated pt today, pt has long hx of chronic pain issues and pt spent a significant time in the hospital post GB removal due to pain and constipation; he does not feel pt's pain today is related to an acute surgical issue or complication from her recent surgery, as she had a normal [place] RUQ before her hospital discharge on 11/14/13; states he feels pt is having GI issue, he rx bentyl today after ofc visit, and pt needs referral to GI MD; requests to call Hospitalist if pt needs admission with GI MD consult.   1615:  Pt continues to c/o abd pain despite  multiple doses of IV fentanyl and dilaudid. New LFT elevation today; [place] with small amount of fluid in GB fossa, and mild prominence of intrahepatic ducts, new from previous. Concern for retained stone vs biliary leak; will admit. T/C to [facility] Dr. Anastasio Champion, case discussed, including:  HPI, pertinent PM/SHx, VS/PE, dx testing, ED course and treatment:  Agreeable to admit, requests to write temporary orders, obtain medical bed to Dr. Josephine Cables service.  T/C to GI Dr. Gala Romney, case discussed, including:  HPI, pertinent PM/SHx, VS/PE, dx testing, ED course and treatment:  Agreeable to consult, requests to obtain HIDA scan now.       EKG Interpretation None      MDM  MDM Reviewed: previous chart, nursing note and vitals Reviewed previous: labs and ultrasound Interpretation: labs, x-ray and ultrasound    Results for orders placed during the hospital encounter of 11/18/13  CBC WITH DIFFERENTIAL      Result Value Ref Range   WBC 17.2 (*) 4.0 - 10.5 K/uL   RBC 3.93  3.87 - 5.11 MIL/uL   Hemoglobin 12.1  12.0 - 15.0 g/dL  HCT 35.5 (*) 36.0 - 46.0 %   MCV 90.3  78.0 - 100.0 fL   MCH 30.8  26.0 - 34.0 pg   MCHC 34.1  30.0 - 36.0 g/dL   RDW 12.7  11.5 - 15.5 %   Platelets 465 (*) 150 - 400 K/uL   Neutrophils Relative % 61  43 - 77 %   Lymphocytes Relative 29  12 - 46 %   Monocytes Relative 9  3 - 12 %   Eosinophils Relative 1  0 - 5 %   Basophils Relative 0  0 - 1 %   Neutro Abs 10.5 (*) 1.7 - 7.7 K/uL   Lymphs Abs 5.0 (*) 0.7 - 4.0 K/uL   Monocytes Absolute 1.5 (*) 0.1 - 1.0 K/uL   Eosinophils Absolute 0.2  0.0 - 0.7 K/uL   Basophils Absolute 0.0  0.0 - 0.1 K/uL   WBC Morphology ATYPICAL LYMPHOCYTES     Smear Review PLATELETS APPEAR INCREASED    COMPREHENSIVE METABOLIC PANEL      Result Value Ref Range   Sodium 138  137 - 147 mEq/L   Potassium 4.1  3.7 - 5.3 mEq/L   Chloride 97  96 - 112 mEq/L   CO2 25  19 - 32 mEq/L   Glucose, Bld 130 (*) 70 - 99 mg/dL   BUN 19  6 - 23 mg/dL    Creatinine, Ser 0.64  0.50 - 1.10 mg/dL   Calcium 9.9  8.4 - 10.5 mg/dL   Total Protein 8.6 (*) 6.0 - 8.3 g/dL   Albumin 3.8  3.5 - 5.2 g/dL   AST 150 (*) 0 - 37 U/L   ALT 188 (*) 0 - 35 U/L   Alkaline Phosphatase 326 (*) 39 - 117 U/L   Total Bilirubin 1.1  0.3 - 1.2 mg/dL   GFR calc non Af Amer >90  >90 mL/min   GFR calc Af Amer >90  >90 mL/min  LIPASE, BLOOD      Result Value Ref Range   Lipase 17  11 - 59 U/L    Results for ASLAN, MONTAGNA (MRN [id number]) as of 11/18/2013 14:38  Ref. Range 10/16/2013 10:20 11/11/2013 06:36 11/18/2013 13:53  AST Latest Range: 0-37 U/L 15 40 (H) 150 (H)  ALT Latest Range: 0-35 U/L 12 27 188 (H)   Dg Abd Acute W/chest 11/18/2013   CLINICAL DATA:  Abdomen pain  EXAM: ACUTE ABDOMEN SERIES (ABDOMEN 2 VIEW & CHEST 1 VIEW)  COMPARISON:  None.  FINDINGS: There is no evidence of dilated bowel loops or free intraperitoneal air. Bowel content is noted throughout colon to No radiopaque calculi or other significant radiographic abnormality is seen. Patient status post prior fixation of the right pelvic bone. Prior cholecystectomy clips are noted. Heart size and mediastinal contours are within normal limits. Both lungs are clear.  IMPRESSION: Negative abdominal radiographs. Constipation. No acute cardiopulmonary disease.   Electronically Signed   By: Abelardo Diesel M.D.   On: 11/18/2013 15:58   US Abdomen Limited Ruq 11/18/2013   CLINICAL DATA:  Elevated liver function tests. Recent cholecystectomy with continued right upper quadrant pain.  EXAM: US ABDOMEN LIMITED - RIGHT UPPER QUADRANT  COMPARISON:  US ABDOMEN LIMITED RUQ/ASCITES dated 11/14/2013; CT ABD - PELV W/ CM dated 10/08/2013  FINDINGS: Gallbladder:  Surgically absent with small amount of fluid in the gallbladder fossa.  Common bile duct:  Diameter: 5 mm.  No visualized focal filling defect or cut off.  Liver:  Minimal central intrahepatic ductal prominence.  IMPRESSION: Presumed postsurgical evolution of gallbladder  fossa appearance.  No common duct dilatation. Mild prominence of the central intrahepatic ducts is noted. MRCP with contrast could be helpful for further evaluation if symptoms continue.  These results were called by telephone at the time of interpretation on 11/18/2013 at 3:21 PM to Dr. Francine Graven , who verbally acknowledged these results.   Electronically Signed   By: Conchita Paris M.D.   On: 11/18/2013 15:21   US Abdomen Limited Ruq 11/14/2013   CLINICAL DATA:  4 days s/p lap. gall bladder  EXAM: US ABDOMEN LIMITED - RIGHT UPPER QUADRANT  COMPARISON:  None.  FINDINGS: Gallbladder:  Status post cholecystectomy.  Gallbladder fossa unremarkable.  Common bile duct:  Diameter: 5.3 mm  Liver:  No focal lesion identified. Within normal limits in parenchymal echogenicity.  IMPRESSION: Negative right upper quadrant ultrasound. Gallbladder fossa is unremarkable.   Electronically Signed   By: Margaree Mackintosh M.D.   On: 11/14/2013 14:10     Alfonzo Feller, DO 11/20/13 [id number]

## 2013-11-18 NOTE — Anesthesia Procedure Notes (Signed)
Procedure Name: Intubation Date/Time: 11/18/2013 8:49 PM Performed by: Vista Deck Pre-anesthesia Checklist: Patient identified, Patient being monitored, Timeout performed, Emergency Drugs available and Suction available Patient Re-evaluated:Patient Re-evaluated prior to inductionOxygen Delivery Method: Circle System Utilized Preoxygenation: Pre-oxygenation with 100% oxygen Intubation Type: IV induction, Rapid sequence and Cricoid Pressure applied Laryngoscope Size: Mac and 3 Grade View: Grade II Tube type: Oral Tube size: 7.0 mm Number of attempts: 1 Airway Equipment and Method: stylet and Oral airway Placement Confirmation: ETT inserted through vocal cords under direct vision,  positive ETCO2 and breath sounds checked- equal and bilateral Secured at: 21 cm Tube secured with: Tape Dental Injury: Teeth and Oropharynx as per pre-operative assessment

## 2013-11-18 NOTE — Anesthesia Postprocedure Evaluation (Addendum)
  Anesthesia Post-op Note  Patient: Catherine Ramirez  Procedure(s) Performed: Procedure(s): ENDOSCOPIC RETROGRADE CHOLANGIOPANCREATOGRAPHY (ERCP) WITH SMALL SPHINCTEROTOMY (N/A)  Patient Location: PACU  Anesthesia Type:General  Level of Consciousness: awake and patient cooperative  Airway and Oxygen Therapy: Patient Spontanous Breathing  Post-op Pain: moderate  Post-op Assessment: Post-op Vital signs reviewed, Patient's Cardiovascular Status Stable, Respiratory Function Stable, Patent Airway and No signs of Nausea or vomiting  Post-op Vital Signs: Reviewed and stable  Complications: No apparent anesthesia complications

## 2013-11-18 NOTE — Op Note (Signed)
ERCP PROCEDURE REPORT  PATIENT:  Catherine Ramirez  MR#:  [id number] Birthdate:  03/05/1986, 28 y.o., female Endoscopist:  Dr. Rogene Houston, MD Referred By:  Dr. Genella Rife, MD Procedure Date: 11/18/2013  Procedure:   ERCP with biliary stenting.    Indications:  Patient is a 28 year old Caucasian female who presents with biliary leak 8 days following cholecystectomy. Transaminases are elevated and she therefore could have small stone which triggered this process.            Informed Consent:  The risks, benefits, limitations, alternatives, and mponderable have been reviewed with the patient. I specifically discussed a 1 in 10 chance of pancreatitis, reaction to medications, bleeding, perforation and the possibility of a failed ERCP. Potential for sphincterotomy and stent placement also reviewed. Questions have been answered. All parties agreeable.  Please see history & physical in medical record for more information.  Medications:  *Please see anesthesia record for complete details  Description of procedure:  Procedure performed in the OR. The patient was placed under anesthesia, intubated, and turned into semipermanent position. Therapeutic Pentax video duodenoscope passed through the oropharynx without any difficulty into the esophagus, stomach, and across the pylorus and pull, and descending duodenum.    Findings:  Small amount of food debris noted in the stomach. Antral and pyloric mucosa normal. Normal ampulla of Vater. CBD cannulated with Rx 44 autotome and hydrajag wire. biliary system filled with dilute contrast. CBD and CHD 7-8 mm in diameter. Biliary leak noted from right hepatic duct(duct of Luschka). Pancreatic duct was not cannulated or filled with contrast.  Therapeutic/Diagnostic Maneuvers Performed:   Small sphincterotomy performed and 10 French 7 cm long biliary stent placed using Advantix system with prompt drainage of contrast into duodenum.  Complications:   None  Impression:  Biliary leak from duct of Luschka. Small sphincterotomy performed and  10 French 7 cm long plastic stent placed for biliary decompression.  Recommendations:  No aspirin or anticoagulants for 72 hours. Clear liquids tonight. Lab in a.m. She will return for ERCP with stent removal in 6-8 weeks.  Rogene Houston  11/18/2013  9:32 PM  CC: Dr. [name], MD & Dr. Rayne Du ref. provider found CC  Dr. Scherry Ran, MD

## 2013-11-18 NOTE — ED Notes (Signed)
Called report to [facility], pt in [place] for scan, will transport to [place] on return.

## 2013-11-19 LAB — COMPREHENSIVE METABOLIC PANEL
ALK PHOS: 226 U/L — AB (ref 39–117)
ALT: 113 U/L — ABNORMAL HIGH (ref 0–35)
AST: 43 U/L — ABNORMAL HIGH (ref 0–37)
Albumin: 3 g/dL — ABNORMAL LOW (ref 3.5–5.2)
BUN: 13 mg/dL (ref 6–23)
CALCIUM: 8.9 mg/dL (ref 8.4–10.5)
CO2: 26 mEq/L (ref 19–32)
Chloride: 99 mEq/L (ref 96–112)
Creatinine, Ser: 0.62 mg/dL (ref 0.50–1.10)
GFR calc non Af Amer: >90 mL/min (ref >90)
Glucose, Bld: 79 mg/dL (ref 70–99)
Potassium: 3.5 mEq/L — ABNORMAL LOW (ref 3.7–5.3)
Sodium: 137 mEq/L (ref 137–147)
TOTAL PROTEIN: 7.8 g/dL (ref 6.0–8.3)
Total Bilirubin: 1.1 mg/dL (ref 0.3–1.2)

## 2013-11-19 LAB — CBC
HEMATOCRIT: 29.9 % — AB (ref 36.0–46.0)
Hemoglobin: 10.1 g/dL — ABNORMAL LOW (ref 12.0–15.0)
MCH: 30.8 pg (ref 26.0–34.0)
MCHC: 33.8 g/dL (ref 30.0–36.0)
MCV: 91.2 fL (ref 78.0–100.0)
Platelets: 345 10*3/uL (ref 150–400)
RBC: 3.28 MIL/uL — ABNORMAL LOW (ref 3.87–5.11)
RDW: 12.7 % (ref 11.5–15.5)
WBC: 14.7 10*3/uL — ABNORMAL HIGH (ref 4.0–10.5)

## 2013-11-19 LAB — AMYLASE: AMYLASE: 46 U/L (ref 0–105)

## 2013-11-19 MED ORDER — PANTOPRAZOLE SODIUM 40 MG IV SOLR: 40.0000 mg | INTRAVENOUS | Status: DC

## 2013-11-19 MED ORDER — PANTOPRAZOLE SODIUM 40 MG PO TBEC
40.0000 mg | DELAYED_RELEASE_TABLET | Freq: Every day | ORAL | Status: DC
  Administered 2013-11-19 – 2013-11-24 (×7): 40 mg via ORAL
  Filled 2013-11-19 (×7): qty 1

## 2013-11-19 MED ORDER — FAMOTIDINE IN NACL 20-0.9 MG/50ML-% IV SOLN
INTRAVENOUS | Status: AC
  Filled 2013-11-19: qty 50

## 2013-11-19 MED ORDER — ZOLPIDEM TARTRATE 5 MG PO TABS
5.0000 mg | ORAL_TABLET | Freq: Every evening | ORAL | Status: DC | PRN
Start: 2013-11-19 — End: 2013-11-25
  Administered 2013-11-19 – 2013-11-25 (×7): 5 mg via ORAL
  Filled 2013-11-19 (×8): qty 1

## 2013-11-19 MED ORDER — FAMOTIDINE IN NACL 20-0.9 MG/50ML-% IV SOLN
20.0000 mg | Freq: Two times a day (BID) | INTRAVENOUS | Status: DC
  Administered 2013-11-19 – 2013-11-20 (×2): 20 mg via INTRAVENOUS
  Filled 2013-11-19 (×4): qty 50

## 2013-11-19 MED ORDER — HYDROMORPHONE HCL PF 1 MG/ML IJ SOLN
3.0000 mg | INTRAMUSCULAR | Status: DC | PRN
  Administered 2013-11-19 – 2013-11-25 (×66): 3 mg via INTRAVENOUS
  Filled 2013-11-19 (×67): qty 3

## 2013-11-19 NOTE — Progress Notes (Signed)
Subjective; Patient continues to complain of right upper quadrant abdominal pain. She admits the pain is not as bad as it was yesterday. She states the medication is not controlling her pain. She states she was up all night. She denies nausea or vomiting. Patient has chronic pain and states she has been on Suboxone until admission.  Objective; BP 106/65  Pulse 98  Temp(Src) 98.9 F (37.2 C) (Oral)  Resp 20  Ht 5\' 6"  (1.676 m)  Wt 143 lb 1.3 oz (64.9 kg)  BMI 23.10 kg/m2  SpO2 100%  LMP 11/11/2013 Patient is alert and appears to be in pain. Abdominal exam reveals normal bowel sounds. She remains with moderate tenderness with guarding in right upper quadrant. No organomegaly or masses.  Lab data; WBC 14.7, H&H is 10.1 and 29.9 and platelet count 345K Serum sodium 137, potassium 3.5, chloride 99, CO2 26, BUN 13, creatinine 0.62. Serum calcium 8.9 Serum amylase 46. Bilirubin 1.1, AP 226, AST 43, ALT 113, albumin 3.0.  Assessment; Postcholecystectomy biliary leak from ducts of Luschka. Patient is having less pain; all other parameters suggests improvement i.e. drop in WBC and transaminases she is afebrile. She has chronic pain and dependence on pain medications. Pain management per Dr. Legrand Rams.  Recommendations; Advance diet. CBC and LFTs in a.m.

## 2013-11-19 NOTE — Anesthesia Postprocedure Evaluation (Signed)
  Anesthesia Post-op Note  Patient: Catherine Ramirez  Procedure(s) Performed: Procedure(s): ENDOSCOPIC RETROGRADE CHOLANGIOPANCREATOGRAPHY (ERCP) WITH SMALL SPHINCTEROTOMY (N/A)  Patient Location: Nursing Unit  Anesthesia Type:General  Level of Consciousness: awake, oriented and patient cooperative  Airway and Oxygen Therapy: Patient Spontanous Breathing  Post-op Pain: moderate  Post-op Assessment: Post-op Vital signs reviewed, Patient's Cardiovascular Status Stable, Respiratory Function Stable and Patent Airway  Post-op Vital Signs: Reviewed and stable  Last Vitals:  Filed Vitals:   11/19/13 0614  BP: 106/65  Pulse: 98  Temp: 37.2 C  Resp: 20    Complications: No apparent anesthesia complications

## 2013-11-19 NOTE — Progress Notes (Signed)
Patient given Catherine Ramirez at this time which she had refused earlier. Patient still complain of pain, not as agitated, speaking in regular tone. Husband remains calm at bedside.

## 2013-11-19 NOTE — Progress Notes (Signed)
Patient c/o abdominal discomfort, this writer went into patient's room with dilaudid 2mg  IV as ordered, at first patient refused to take stating I want 4mg  dilaudid instead of two. Patient did allow me to give her the 2mg  and Dr. Cindie Laroche was paged. Dr. Cindie Laroche returned call and notified of patient's request. He did not change order. Patient was informed at which time she became very agitated, yelling stating I want 4 mg dilaudid and can this Probation officer call another doctor.Patient was informed that Dr. Cindie Laroche was covering for Dr. Legrand Rams. Charge nurse and nursing supervisor was made aware of situation. Supervisor spoke with patient.

## 2013-11-19 NOTE — Progress Notes (Signed)
Pt has been very verbal about the fact that she is unhappy with her care and every interaction with every staff member she has had today.  She has demanded to have a regular diet.  She has demanded the routes and dosages of all medications that she is taking.  Pt is demanding environmental services clean her room twice daily and very verbally abusive to all staff when her demands are not met.  Pt uses foul language and demands things with dietary as well.  She curses loudly and disturbs patients around her and makes threats to sue.  Pt's behavior is aggressive and verbally abusive to staff until she receives what she wants and then her demeanor changes.  Pt has consumed the dinner provided by the hospital as well as the food that her mother brought to her earlier when she refused to follow her full liquid diet.  Pt still states her pain is bad but able to ambulate the room and watch staff as her room is cleaned. Pt is expelling gas and has been observed belching very loudly.

## 2013-11-19 NOTE — Progress Notes (Signed)
Patient received dilaudid 2 mg IV as ordered.

## 2013-11-19 NOTE — Progress Notes (Signed)
This writer was called into patient's room, upon arrival  patient had removed tape from IV site and IV was out of arm. Patient states she doesn't know what happened. 2x2 gauze with tape was applied.

## 2013-11-19 NOTE — Addendum Note (Signed)
Addendum created 11/19/13 1141 by Vista Deck, CRNA   Modules edited: Notes Section   Notes Section:  File: [id number]

## 2013-11-19 NOTE — Progress Notes (Signed)
The patient is receiving Protonix by the intravenous route.  Based on criteria approved by the Pharmacy and [place], the medication is being converted to the equivalent oral dose form.  These criteria include: -No Active GI bleeding -Able to tolerate diet of full liquids (or better) or tube feeding OR able to tolerate other medications by the oral or enteral route  If you have any questions about this conversion, please contact the Pharmacy Department (ext [phone]).  Thank you.  Pricilla Larsson, [facility] 11/19/2013 11:24 AM

## 2013-11-19 NOTE — Progress Notes (Signed)
Subjective: Patient was admitted due to abdominal pain. She was found to have postprostatectomy leak. She was seen by GI and ERCP with stent was done. Patient continue to complain of pain.   Objective: Vital signs in last 24 hours: Temp:  [97.7 F (36.5 C)-98.9 F (37.2 C)] 98.9 F (37.2 C) (04/18 1610) Pulse Rate:  [81-112] 98 (04/18 0614) Resp:  [15-38] 20 (04/18 0614) BP: (76-136)/(59-90) 106/65 mmHg (04/18 0614) SpO2:  [96 %-100 %] 100 % (04/18 0614) Weight:  [63.504 kg (140 lb)-64.9 kg (143 lb 1.3 oz)] 64.9 kg (143 lb 1.3 oz) (04/17 1825) Weight change:  Last BM Date: 11/18/13  Intake/Output from previous day: 04/17 0701 - 04/18 0700 In: 1520 [P.O.:720; I.V.:800] Out: 2 [Urine:2]  PHYSICAL EXAM General appearance: alert and no distress Resp: clear to auscultation bilaterally Cardio: S1, S2 normal GI: bowel sound ++, tenderness of the RUQ area Extremities: extremities normal, atraumatic, no cyanosis or edema  Lab Results:    @labtest @ [facility] No results found for this basename: PHART, PCO2, PO2ART, TCO2, HCO3,  in the last 72 hours CULTURES No results found for this or any previous visit (from the past 240 hour(s)). Studies/Results: Nm Hepatobiliary Liver Func  11/18/2013   CLINICAL DATA:  Right upper quadrant pain. Cholecystectomy 11/10/2013.  EXAM: NUCLEAR MEDICINE HEPATOBILIARY IMAGING  TECHNIQUE: Sequential images of the abdomen were obtained out to 60 minutes following intravenous administration of radiopharmaceutical.  RADIOPHARMACEUTICALS:  5.0 mCi mCi Tc-47m Choletec  COMPARISON:  No comparisons  FINDINGS: There is prompt uptake and excretion of radiotracer by the liver. There is radiotracer accumulation in the region of the gallbladder fossa which is later noted to spread in the abdomen. Findings are compatible with a biliary leak in the gallbladder fossa.  IMPRESSION: Biliary leak in the gallbladder fossa.  These results were called by telephone at the time of  interpretation on 11/18/2013 at 6:50 PM to Dr. Anastasio Champion who verbally acknowledged these results.   Electronically Signed   By: Rolm Baptise M.D.   On: 11/18/2013 18:58   Dg Ercp With Sphincterotomy  11/19/2013   CLINICAL DATA:  CBD is stone.  EXAM: ERCP  TECHNIQUE: Multiple spot images obtained with the fluoroscopic device and submitted for interpretation post-procedure.  COMPARISON:  US ABDOMEN COMPLETE dated 10/16/2013; CT ABD - PELV W/ CM dated 10/08/2013; NM HEPATOBILIARY INCLUDE GB dated 11/18/2013  FINDINGS: Multiple images are submitted from ERCP. Opacification of the common bile duct demonstrates normal caliber system. There are least 2 areas of contrast leakage into the gallbladder fossa from right hepatic ducts. Final image demonstrates placement of biliary stent.  IMPRESSION: At least 2 small right hepatic ducts demonstrate leakage into the region of the gallbladder fossa.  These images were submitted for radiologic interpretation only. Please see the procedural report for the amount of contrast and the fluoroscopy time utilized.   Electronically Signed   By: Rolm Baptise M.D.   On: 11/19/2013 00:15   Dg Abd Acute W/chest  11/18/2013   CLINICAL DATA:  Abdomen pain  EXAM: ACUTE ABDOMEN SERIES (ABDOMEN 2 VIEW & CHEST 1 VIEW)  COMPARISON:  None.  FINDINGS: There is no evidence of dilated bowel loops or free intraperitoneal air. Bowel content is noted throughout colon to No radiopaque calculi or other significant radiographic abnormality is seen. Patient status post prior fixation of the right pelvic bone. Prior cholecystectomy clips are noted. Heart size and mediastinal contours are within normal limits. Both lungs are clear.  IMPRESSION: Negative abdominal  radiographs. Constipation. No acute cardiopulmonary disease.   Electronically Signed   By: Abelardo Diesel M.D.   On: 11/18/2013 15:58   US Abdomen Limited Ruq  11/18/2013   CLINICAL DATA:  Elevated liver function tests. Recent cholecystectomy with  continued right upper quadrant pain.  EXAM: US ABDOMEN LIMITED - RIGHT UPPER QUADRANT  COMPARISON:  US ABDOMEN LIMITED RUQ/ASCITES dated 11/14/2013; CT ABD - PELV W/ CM dated 10/08/2013  FINDINGS: Gallbladder:  Surgically absent with small amount of fluid in the gallbladder fossa.  Common bile duct:  Diameter: 5 mm.  No visualized focal filling defect or cut off.  Liver:  Minimal central intrahepatic ductal prominence.  IMPRESSION: Presumed postsurgical evolution of gallbladder fossa appearance.  No common duct dilatation. Mild prominence of the central intrahepatic ducts is noted. MRCP with contrast could be helpful for further evaluation if symptoms continue.  These results were called by telephone at the time of interpretation on 11/18/2013 at 3:21 PM to Dr. Francine Graven , who verbally acknowledged these results.   Electronically Signed   By: Conchita Paris M.D.   On: 11/18/2013 15:21    Medications: I have reviewed the patient's current medications.  Assesment: Active Problems:   Chronic constipation   Chronic pain syndrome   Abdominal pain   Abdominal pain, acute Postprostatectomy biliary leak S/P ERCP and stent placement   Plan: Medications reviewed GI consult appreciated Will increase hydromorphone to 3 mg Iv q2 hours PRN    LOS: 1 day   Catherine Ramirez 11/19/2013, 9:11 AM
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
## 2013-11-20 ENCOUNTER — Inpatient Hospital Stay (HOSPITAL_COMMUNITY): Payer: Medicaid Other

## 2013-11-20 LAB — URINALYSIS, ROUTINE W REFLEX MICROSCOPIC
BILIRUBIN URINE: NEGATIVE
Glucose, UA: NEGATIVE mg/dL
KETONES UR: NEGATIVE mg/dL
Leukocytes, UA: NEGATIVE
NITRITE: NEGATIVE
Protein, ur: NEGATIVE mg/dL
Specific Gravity, Urine: 1.01 (ref 1.005–1.030)
Urobilinogen, UA: 0.2 mg/dL (ref 0.0–1.0)
pH: 7 (ref 5.0–8.0)

## 2013-11-20 LAB — URINE MICROSCOPIC-ADD ON

## 2013-11-20 LAB — CBC
HEMATOCRIT: 28.4 % — AB (ref 36.0–46.0)
Hemoglobin: 9.4 g/dL — ABNORMAL LOW (ref 12.0–15.0)
MCH: 30.4 pg (ref 26.0–34.0)
MCHC: 33.1 g/dL (ref 30.0–36.0)
MCV: 91.9 fL (ref 78.0–100.0)
PLATELETS: 341 10*3/uL (ref 150–400)
RBC: 3.09 MIL/uL — AB (ref 3.87–5.11)
RDW: 12.9 % (ref 11.5–15.5)
WBC: 12.5 10*3/uL — AB (ref 4.0–10.5)

## 2013-11-20 LAB — HEPATIC FUNCTION PANEL
ALT: 71 U/L — AB (ref 0–35)
AST: 18 U/L (ref 0–37)
Albumin: 3 g/dL — ABNORMAL LOW (ref 3.5–5.2)
Alkaline Phosphatase: 176 U/L — ABNORMAL HIGH (ref 39–117)
BILIRUBIN TOTAL: 0.4 mg/dL (ref 0.3–1.2)
Bilirubin, Direct: <0.2 mg/dL (ref 0.0–0.3)
Total Protein: 7.5 g/dL (ref 6.0–8.3)

## 2013-11-20 MED ORDER — FAMOTIDINE 20 MG PO TABS
20.0000 mg | ORAL_TABLET | Freq: Two times a day (BID) | ORAL | Status: DC
  Administered 2013-11-20 – 2013-11-25 (×11): 20 mg via ORAL
  Filled 2013-11-20 (×11): qty 1

## 2013-11-20 MED ORDER — CIPROFLOXACIN HCL 250 MG PO TABS
500.0000 mg | ORAL_TABLET | Freq: Two times a day (BID) | ORAL | Status: DC
  Administered 2013-11-20 (×2): 500 mg via ORAL
  Filled 2013-11-20 (×2): qty 2

## 2013-11-20 NOTE — Progress Notes (Signed)
Subjective: Patient is alert, awake and resting. Patient continued to complain of rt flank pain. However, her LFT and cbc is improving. Her urnilysis is abnormal.  Objective: Vital signs in last 24 hours: Temp:  [98.9 F (37.2 C)-99.8 F (37.7 C)] 99.5 F (37.5 C) (04/19 0603) Pulse Rate:  [96-97] 96 (04/19 0603) Resp:  [20] 20 (04/19 0603) BP: (107-115)/(65-71) 114/71 mmHg (04/19 0603) SpO2:  [97 %-100 %] 100 % (04/19 0603) Weight change:  Last BM Date: 11/18/13  Intake/Output from previous day: 04/18 0701 - 04/19 0700 In: 1309.7 [P.O.:720; I.V.:589.7] Out: 500 [Urine:500]  PHYSICAL EXAM General appearance: alert and no distress Resp: clear to auscultation bilaterally Cardio: S1, S2 normal GI: bowel sound ++, tenderness of the RUQ area Extremities: extremities normal, atraumatic, no cyanosis or edema  Lab Results:    @labtest @ [facility] No results found for this basename: PHART, PCO2, PO2ART, TCO2, HCO3,  in the last 72 hours CULTURES No results found for this or any previous visit (from the past 240 hour(s)). Studies/Results: Nm Hepatobiliary Liver Func  11/18/2013   CLINICAL DATA:  Right upper quadrant pain. Cholecystectomy 11/10/2013.  EXAM: NUCLEAR MEDICINE HEPATOBILIARY IMAGING  TECHNIQUE: Sequential images of the abdomen were obtained out to 60 minutes following intravenous administration of radiopharmaceutical.  RADIOPHARMACEUTICALS:  5.0 mCi mCi Tc-87m Choletec  COMPARISON:  No comparisons  FINDINGS: There is prompt uptake and excretion of radiotracer by the liver. There is radiotracer accumulation in the region of the gallbladder fossa which is later noted to spread in the abdomen. Findings are compatible with a biliary leak in the gallbladder fossa.  IMPRESSION: Biliary leak in the gallbladder fossa.  These results were called by telephone at the time of interpretation on 11/18/2013 at 6:50 PM to Dr. Anastasio Champion who verbally acknowledged these results.   Electronically Signed    By: Rolm Baptise M.D.   On: 11/18/2013 18:58   Dg Ercp With Sphincterotomy  11/19/2013   CLINICAL DATA:  CBD is stone.  EXAM: ERCP  TECHNIQUE: Multiple spot images obtained with the fluoroscopic device and submitted for interpretation post-procedure.  COMPARISON:  US ABDOMEN COMPLETE dated 10/16/2013; CT ABD - PELV W/ CM dated 10/08/2013; NM HEPATOBILIARY INCLUDE GB dated 11/18/2013  FINDINGS: Multiple images are submitted from ERCP. Opacification of the common bile duct demonstrates normal caliber system. There are least 2 areas of contrast leakage into the gallbladder fossa from right hepatic ducts. Final image demonstrates placement of biliary stent.  IMPRESSION: At least 2 small right hepatic ducts demonstrate leakage into the region of the gallbladder fossa.  These images were submitted for radiologic interpretation only. Please see the procedural report for the amount of contrast and the fluoroscopy time utilized.   Electronically Signed   By: Rolm Baptise M.D.   On: 11/19/2013 00:15   Dg Abd Acute W/chest  11/18/2013   CLINICAL DATA:  Abdomen pain  EXAM: ACUTE ABDOMEN SERIES (ABDOMEN 2 VIEW & CHEST 1 VIEW)  COMPARISON:  None.  FINDINGS: There is no evidence of dilated bowel loops or free intraperitoneal air. Bowel content is noted throughout colon to No radiopaque calculi or other significant radiographic abnormality is seen. Patient status post prior fixation of the right pelvic bone. Prior cholecystectomy clips are noted. Heart size and mediastinal contours are within normal limits. Both lungs are clear.  IMPRESSION: Negative abdominal radiographs. Constipation. No acute cardiopulmonary disease.   Electronically Signed   By: Abelardo Diesel M.D.   On: 11/18/2013 15:58   US Abdomen  Limited Ruq  11/18/2013   CLINICAL DATA:  Elevated liver function tests. Recent cholecystectomy with continued right upper quadrant pain.  EXAM: US ABDOMEN LIMITED - RIGHT UPPER QUADRANT  COMPARISON:  US ABDOMEN LIMITED  RUQ/ASCITES dated 11/14/2013; CT ABD - PELV W/ CM dated 10/08/2013  FINDINGS: Gallbladder:  Surgically absent with small amount of fluid in the gallbladder fossa.  Common bile duct:  Diameter: 5 mm.  No visualized focal filling defect or cut off.  Liver:  Minimal central intrahepatic ductal prominence.  IMPRESSION: Presumed postsurgical evolution of gallbladder fossa appearance.  No common duct dilatation. Mild prominence of the central intrahepatic ducts is noted. MRCP with contrast could be helpful for further evaluation if symptoms continue.  These results were called by telephone at the time of interpretation on 11/18/2013 at 3:21 PM to Dr. Francine Graven , who verbally acknowledged these results.   Electronically Signed   By: Conchita Paris M.D.   On: 11/18/2013 15:21    Medications: I have reviewed the patient's current medications.  Assesment: Active Problems:   Chronic constipation   Chronic pain syndrome   Abdominal pain   Abdominal pain, acute Postprostatectomy biliary leak S/P ERCP and stent placement ?UTI  Plan: Medications reviewed As per GI plan Continue pain management Will start ciprofloxacin emperically pending culture result   LOS: 2 days   Manases Etchison 11/20/2013, 10:20 AM
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
## 2013-11-20 NOTE — Progress Notes (Signed)
Abdominopelvic CT reviewed; Small fluid collection or biloma the gallbladder fossa but no evidence of ascites. Repeat urinalysis without WBCs and less than 3 RBCs.

## 2013-11-20 NOTE — Progress Notes (Signed)
Nutrition Brief Note  Patient identified on the Malnutrition Screening Tool (MST) Report  Wt Readings from Last 15 Encounters:  11/18/13 143 lb 1.3 oz (64.9 kg)  11/18/13 143 lb 1.3 oz (64.9 kg)  11/10/13 138 lb (62.596 kg)  11/10/13 138 lb (62.596 kg)  11/02/13 140 lb (63.504 kg)  10/13/13 144 lb 13.5 oz (65.7 kg)  10/13/13 144 lb 13.5 oz (65.7 kg)  10/10/13 142 lb (64.411 kg)  10/07/13 135 lb (61.236 kg)  01/28/12 155 lb (70.308 kg)    Body mass index is 23.1 kg/(m^2). Patient meets criteria for normal  based on current BMI.  No significant wt change per her weight hx. Her appetite is very good per nursing.  Current diet order is Regular patient is consuming approximately 100% of meals at this time. Labs and medications reviewed.   No nutrition interventions warranted at this time. Colman Cater MS,RD,CSG,LDN Office: [phone] Pager: [phone]

## 2013-11-20 NOTE — Progress Notes (Signed)
Subjective; Patient continues to complain of severe pain under right rib cage radiating into right lower quadrant and posteriorly. However she does not have any nausea or vomiting. She says she is eating more than 50% of her meals. Yesterday she threatened to leave the facility if her diet was not advanced from full liquids to regular diet. She states she had temperature last evening. Her temp is 99.8. Objective; BP 114/71  Pulse 96  Temp(Src) 99.5 F (37.5 C) (Oral)  Resp 20  Ht 5\' 6"  (1.676 m)  Wt 143 lb 1.3 oz (64.9 kg)  BMI 23.10 kg/m2  SpO2 100%  LMP 11/11/2013 Patient is alert and appears to be in some pain. Abdomen is symmetrical with normal bowel sounds. Abdomen is soft with moderate tenderness and guarding in right upper quadrant and mild tenderness in right lower quadrant.  Lab data; WBC 12.5, H&H 9.4 and 28.4 and platelet count 341K. Bilirubin is 0.4, AB 176, AST 18, ALT 71, albumin 3.0 Urinalysis results noted. This is from 11/18/2013. Positive nitrites and positive leukocytes; microscopy revealed 3-6 WBCs and too numerous to count RBCs.  Assessment; #1. Biliary leak from  ducts of Luschka a days after after cholecystectomy. Status post ERCP with biliary stenting 2 days ago with improvement in LFTs and leukocytosis and slow symptomatic improvement. It is difficult to assess her given history of chronic pain and dependence on narcotics. #2. Anemia possibly due to acute illness and hemodilution. No evidence of GI bleed. #3. Abnormal urinalysis.   Recommendations; Abdominal pelvic CT without IV contrast. Decrease IV fluids to 50 mL per hour. Lab in a.m. Clean-catch urinalysis and culture.
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
## 2013-11-20 NOTE — Progress Notes (Signed)
The patient is receiving famotidine by the intravenous route.  Based on criteria approved by the Pharmacy and [facility], the medication is being converted to the equivalent oral dose form.  These criteria include: -No Active GI bleeding -Able to tolerate diet of full liquids (or better) or tube feeding OR able to tolerate other medications by the oral or enteral route  If you have any questions about this conversion, please contact the Pharmacy Department (ext [phone]).  Thank you.  Pricilla Larsson, [facility] 11/20/2013 12:23 PM

## 2013-11-21 ENCOUNTER — Encounter (HOSPITAL_COMMUNITY): Payer: Self-pay | Admitting: Internal Medicine

## 2013-11-21 DIAGNOSIS — R109 Unspecified abdominal pain: Secondary | ICD-10-CM

## 2013-11-21 LAB — CBC
HEMATOCRIT: 28.2 % — AB (ref 36.0–46.0)
Hemoglobin: 9.6 g/dL — ABNORMAL LOW (ref 12.0–15.0)
MCH: 30.9 pg (ref 26.0–34.0)
MCHC: 34 g/dL (ref 30.0–36.0)
MCV: 90.7 fL (ref 78.0–100.0)
Platelets: 352 10*3/uL (ref 150–400)
RBC: 3.11 MIL/uL — ABNORMAL LOW (ref 3.87–5.11)
RDW: 12.7 % (ref 11.5–15.5)
WBC: 16.1 10*3/uL — ABNORMAL HIGH (ref 4.0–10.5)

## 2013-11-21 LAB — BASIC METABOLIC PANEL
BUN: 10 mg/dL (ref 6–23)
CALCIUM: 8.9 mg/dL (ref 8.4–10.5)
CO2: 28 mEq/L (ref 19–32)
CREATININE: 0.71 mg/dL (ref 0.50–1.10)
Chloride: 96 mEq/L (ref 96–112)
Glucose, Bld: 128 mg/dL — ABNORMAL HIGH (ref 70–99)
Potassium: 3.9 mEq/L (ref 3.7–5.3)
Sodium: 136 mEq/L — ABNORMAL LOW (ref 137–147)

## 2013-11-21 LAB — URINE CULTURE
COLONY COUNT: NO GROWTH
CULTURE: NO GROWTH

## 2013-11-21 MED ORDER — LEVOFLOXACIN IN D5W 750 MG/150ML IV SOLN
750.0000 mg | INTRAVENOUS | Status: DC
  Administered 2013-11-21 – 2013-11-25 (×5): 750 mg via INTRAVENOUS
  Filled 2013-11-21 (×6): qty 150

## 2013-11-21 MED ORDER — DIPHENHYDRAMINE HCL 50 MG/ML IJ SOLN
25.0000 mg | Freq: Three times a day (TID) | INTRAMUSCULAR | Status: DC | PRN
  Administered 2013-11-21 – 2013-11-23 (×5): 25 mg via INTRAVENOUS
  Filled 2013-11-21 (×6): qty 1

## 2013-11-21 MED ORDER — ACETAMINOPHEN 325 MG PO TABS
650.0000 mg | ORAL_TABLET | Freq: Four times a day (QID) | ORAL | Status: DC | PRN
Start: 2013-11-21 — End: 2013-11-25

## 2013-11-21 MED ORDER — METRONIDAZOLE IN NACL 5-0.79 MG/ML-% IV SOLN
500.0000 mg | Freq: Four times a day (QID) | INTRAVENOUS | Status: DC
  Administered 2013-11-21 – 2013-11-23 (×8): 500 mg via INTRAVENOUS
  Filled 2013-11-21 (×9): qty 100
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
## 2013-11-21 NOTE — Progress Notes (Signed)
Subjective; Patient has noted increasing pain under the right rib cage she also had fever last night without chills. Her appetite is fair. She is passing flatus but hasn't had a BM for a few days. She states the medication is working well.  Objective; BP 109/62  Pulse 89  Temp(Src) 98.6 F (37 C) (Oral)  Resp 20  Ht 5\' 6"  (1.676 m)  Wt 143 lb 4.8 oz (65 kg)  BMI 23.14 kg/m2  SpO2 98%  LMP 11/11/2013 Patient is alert and does not appear to be in acute distress. Abdomen symmetrical with normal bowel sounds. It is soft with moderate tenderness below right costal margin. No organomegaly or masses.  Lab data; WBC 16.1, H&H 9.6 and 28.2 and platelet count 352K  Assessment; Patient has developed fever and leukocytosis. White cell count 3 days ago was over 17,000 and dropped 12,500 yesterday but today it is up to 16,000. Since she has no other source of infection suspected maybe infection and small fluid collection that she has had gallbladder fossa. He was switched from by mouth Cipro to IV Levaquin by Dr. Legrand Rams as we discussed it today. Will broaden coverage to cover for anaerobes. Dr. Hulan Amato input appreciated.  Recommendations; Metronidazole 500 mg IV every 6 hours. CBC in a.m.

## 2013-11-21 NOTE — Care Management Note (Signed)
UR completed 

## 2013-11-21 NOTE — Care Management Note (Addendum)
    Page 1 of 1   11/22/2013     1:49:05 PM CARE MANAGEMENT NOTE 11/22/2013  Patient:  Catherine Ramirez, Catherine Ramirez   Account Number:  [id number]  Date Initiated:  11/21/2013  Documentation initiated by:  Claretha Cooper  Subjective/Objective Assessment:   Pt admitted from home. Anticipated DC today but then spiked a fever, increase WBC and AB changed to IV. Will follow     Action/Plan:   Anticipated DC Date:     Anticipated DC Plan:  [place]  CM consult      Choice offered to / List presented to:             Status of service:  In process, will continue to follow Medicare Important Message given?   (If response is "NO", the following Medicare IM given date fields will be blank) Date Medicare IM given:   Date Additional Medicare IM given:    Discharge Disposition:    Per UR Regulation:    If discussed at Long Length of Stay Meetings, dates discussed:    Comments:  11/22/13 Claretha Cooper RN BSN CM RN alerted that CT was down tomorrow when CT is scheduled. RN will let MD know.  11/21/13 Claretha Cooper RN BSN CM

## 2013-11-21 NOTE — Progress Notes (Signed)
Subjective: Patient is alert and awake. She is resting. Patient is spiking fever and her leucocytosis is getting worse. I spoke to Dr Laural Golden and he recommended to start on IV antibiotics. Objective: Vital signs in last 24 hours: Temp:  [99.1 F (37.3 C)-101.4 F (38.6 C)] 99.1 F (37.3 C) (04/20 0455) Pulse Rate:  [94-100] 97 (04/20 0455) Resp:  [20] 20 (04/20 0455) BP: (98-111)/(63-69) 98/63 mmHg (04/20 0455) SpO2:  [91 %-98 %] 98 % (04/20 0455) Weight:  [65 kg (143 lb 4.8 oz)] 65 kg (143 lb 4.8 oz) (04/20 0455) Weight change:  Last BM Date: 11/18/13  Intake/Output from previous day: 04/19 0701 - 04/20 0700 In: 2658.3 [P.O.:1680; I.V.:978.3] Out: 700 [Urine:700]  PHYSICAL EXAM General appearance: alert and no distress Resp: clear to auscultation bilaterally Cardio: S1, S2 normal GI: bowel sound ++, tenderness of the RUQ area Extremities: extremities normal, atraumatic, no cyanosis or edema  Lab Results:    @labtest @ [facility] No results found for this basename: PHART, PCO2, PO2ART, TCO2, HCO3,  in the last 72 hours CULTURES No results found for this or any previous visit (from the past 240 hour(s)). Studies/Results: Ct Abdomen Pelvis Wo Contrast  11/20/2013   CLINICAL DATA:  Right upper quadrant abdominal pain. Prior history of biliary leak.  EXAM: CT ABDOMEN AND PELVIS WITHOUT CONTRAST  TECHNIQUE: Multidetector CT imaging of the abdomen and pelvis was performed following the standard protocol without intravenous contrast.  COMPARISON:  CT of the abdomen and pelvis 10/08/2013.  FINDINGS: Lung Bases: Unremarkable.  Abdomen/Pelvis: Status post cholecystectomy. Biliary stent extends from the level of the ampulla into the hepatic hilum in the common bile duct. No description in the gallbladder fossa there is a well-defined 4.0 x 2.7 cm collection the of low to intermediate attenuation material, likely to represent a small biloma. The appearance of the pancreas, spleen, bilateral  adrenal glands and bilateral kidneys is unremarkable. Small splenule medial to the spleen, similar to prior examinations.  No significant volume of ascites. No pneumoperitoneum. No pathologic distention of small bowel. No definite lymphadenopathy identified within the abdomen or pelvis. Uterus and ovaries are unremarkable in appearance. Surgical clips in the left adnexal region. Urinary bladder is unremarkable in appearance.  Musculoskeletal: Sclerosis in the anterior aspect of the ilium bilaterally, likely related to prior external fixation device. ORIF in the right ilium adjacent to the sacroiliac joint.  IMPRESSION: 1. Status post cholecystectomy with small 4.0 x 2.7 cm low to intermediate attenuation collection in the gallbladder fossa, presumably a small postoperative biloma. This corresponds to the area of suspected biliary leak on recent nuclear medicine hepatobiliary scan dated 11/08/2013. 2. Common bile duct stent in position, as above. 3. Additional incidental findings, as above, similar prior studies.   Electronically Signed   By: Vinnie Langton M.D.   On: 11/20/2013 15:25    Medications: I have reviewed the patient's current medications.  Assesment: Active Problems:   Chronic constipation   Chronic pain syndrome   Abdominal pain   Abdominal pain, acute Postprostatectomy biliary leak S/P ERCP and stent placement ?UTI Fever  Plan: Medications reviewed As per GI plan Continue pain management Will start on IV Levaquin 750 mg daily    LOS: 3 days   Yaslin Kirtley 11/21/2013, 7:55 AM

## 2013-11-21 NOTE — Progress Notes (Signed)
Surgery  Filed Vitals:   11/21/13 0455  BP: 98/63  Pulse: 97  Temp: 99.1 F (37.3 C)  Resp: 20   I am sorry I never received a consult on this pt nor was I informed of her need for an ERCP by the GI service.  In  essence, this pt was seen Fri,. In my office with RUQ pain that was not impressively different from her previous office visit. She later went to the ER and I told the ER MD to consult GI for what I thought was IBS complicated by her chronic pain syndrome which as it turned out,  masked a bile leak from an accesory duct of Luschka found on ERCP later on that day by Dr. Laural Golden.  I was never informed of this finding until an hour ago.  A surprising finding considering that this was a delayed leak as no bile was noted in her drain post op and that 4 days post op she had a normal RUQ sonogram just prior to discharge.  She has never been pain free, however.  I have reviewed chart and her ERCP and CT scan where there is a small bile collection that in Dr Clayburn Pert opinion does not merit intervention.  Clinically she is still tender in RUQ but feels much better and is tolerating PO without nausea.  I agree with antibiotics and would certainly add stool softeners as she is still requiring continuous opiates for pain. Will discuss follow up with Dr. Laural Golden and will follow pt with you.  Thanks.

## 2013-11-22 ENCOUNTER — Inpatient Hospital Stay (HOSPITAL_COMMUNITY): Payer: Medicaid Other

## 2013-11-22 LAB — CBC
HCT: 27.3 % — ABNORMAL LOW (ref 36.0–46.0)
Hemoglobin: 9.2 g/dL — ABNORMAL LOW (ref 12.0–15.0)
MCH: 30.2 pg (ref 26.0–34.0)
MCHC: 33.7 g/dL (ref 30.0–36.0)
MCV: 89.5 fL (ref 78.0–100.0)
PLATELETS: 374 10*3/uL (ref 150–400)
RBC: 3.05 MIL/uL — ABNORMAL LOW (ref 3.87–5.11)
RDW: 12.6 % (ref 11.5–15.5)
WBC: 14 10*3/uL — ABNORMAL HIGH (ref 4.0–10.5)

## 2013-11-22 MED ORDER — LINACLOTIDE 145 MCG PO CAPS
145.0000 ug | ORAL_CAPSULE | Freq: Every day | ORAL | Status: DC
  Administered 2013-11-22 – 2013-11-25 (×4): 145 ug via ORAL
  Filled 2013-11-22 (×8): qty 1

## 2013-11-22 NOTE — Progress Notes (Signed)
Patient upset with Dr. Hulan Amato visit this afternoon.  Patient wishes to no longer have him see her.  Patient claims "he lied about not knowing I was here over the weekend"  Radene Gunning, LPN accompanied MD into room for visit this afternoon, while I was unavailable on floor, states that she too also felt that patient was not accepting to MD's input.  I later spoke with MD about patient.  I expressed that patient did not want to see him any longer based from my conversation with the patient earlier this morning.  Patient states that she would rather just have Dr. Laural Golden consulting on her case.  Will pass on to oncoming shift that patient does not wish to be seen by Dr. [name] anymore and will notify MD himself.

## 2013-11-22 NOTE — Progress Notes (Signed)
Surgery  Filed Vitals:   11/22/13 1420  BP: 103/67  Pulse: 83  Temp: 98.3 F (36.8 C)  Resp: 20    Abdomen is softly tender in RUQ.  Discussed care plan with Dr. Laural Golden and agree with F/U Ct scan in a day or so to see if there is any increase in size of biloma.  She had one febrile episode yesterday but looks pretty good today.  She states she feels about the same as yesterday.  Bowel sound are normoactive and pt is tolerating PO well.  She states she is passing flatus.  Wounds are clean and healing well.  Worrisome to me is pt's attitude towards me. I sense she blames me for her bile leak and that she is angry with me.  I tried to address this with her but she said she just wanted "to take a nap."  I tried to tell her what our care plan for her was but she seemed hostile. The nurse accompanying me on rounds  ( Ms Estill Dooms) also sensed her hostility. I will continue to see this pt until she expresses that she no longer wishes to have me as her surgeon at which point I'm afraid the medical service will have to transfer her as she has already expressed  her displeasure with Dr. Arnoldo Morale.

## 2013-11-22 NOTE — Progress Notes (Signed)
Subjective; Patient states she had terrible night because her IV was out and she could not get her pain medication. She is feeling better this morning no other IV has been racing she is getting her pain medications. She continues to complain of right upper quadrant pain. Pain is no worse than yesterday. She complains of constipation and would like to be started on Linzess.  Objective; BP 94/57  Pulse 79  Temp(Src) 99.9 F (37.7 C) (Oral)  Resp 20  Ht 5\' 6"  (1.676 m)  Wt 143 lb 4.8 oz (65 kg)  BMI 23.14 kg/m2  SpO2 96%  LMP 11/11/2013 Tmax last 24 hours 100.5  Patient is alert and appears to be comfortable. Abdomen is symmetrical with normal bowel sounds. Abdomen is soft. She has mild tenderness in right lower quadrant and mild to moderate tenderness at RUQ with less guarding compared to yesterday. No LE edema noted.   lab data; WBC 14.0, H&H 9.2 and 27.3 and platelet count 374K.  Assessment; #1. Fever and leukocytosis mostly likely secondary to infected biloma at the gallbladder fossa. She is responding to antibiotic therapy. Temp is coming down and so is WBC. #2. Biliary leak from duct of Luschka. Status post ERCP with stenting on 11/18/2013. #3. Anemia secondary to acute illness. #4. Constipation. Will resume Linzess.   Recommendations; CBC in a.m. Abdominal CT with contrast in a.m(patient is allergic to topical iodine solution but not IV contrast). Linzess 145 mcg by mouth daily

## 2013-11-22 NOTE — Progress Notes (Signed)
Subjective: Patient is resting. Her fever is subsiding. She is receiving combination iv antibiotics. Objective: Vital signs in last 24 hours: Temp:  [98.6 F (37 C)-100.5 F (38.1 C)] 99.9 F (37.7 C) (04/21 0544) Pulse Rate:  [79-91] 79 (04/21 0544) Resp:  [20] 20 (04/21 0544) BP: (94-109)/(57-69) 94/57 mmHg (04/21 0544) SpO2:  [96 %-100 %] 96 % (04/21 0544) Weight change:  Last BM Date: 11/18/13  Intake/Output from previous day: 04/20 0701 - 04/21 0700 In: 1772.5 [P.O.:840; I.V.:682.5; IV Piggyback:250] Out: 67 [Urine:1900]  PHYSICAL EXAM General appearance: alert and no distress Resp: clear to auscultation bilaterally Cardio: S1, S2 normal GI: bowel sound ++, tenderness of the RUQ area Extremities: extremities normal, atraumatic, no cyanosis or edema  Lab Results:    @labtest @ [facility] No results found for this basename: PHART, PCO2, PO2ART, TCO2, HCO3,  in the last 72 hours CULTURES Recent Results (from the past 240 hour(s))  URINE CULTURE     Status: None   Collection Time    11/20/13 12:02 PM      Result Value Ref Range Status   Specimen Description URINE, CLEAN CATCH   Final   Special Requests NONE   Final   Culture  Setup Time     Final   Value: 11/20/2013 20:14     Performed at [place]     Final   Value: NO GROWTH     Performed at [facility]   Culture     Final   Value: NO GROWTH     Performed at [facility]   Report Status 11/21/2013 FINAL   Final   Studies/Results: Ct Abdomen Pelvis Wo Contrast  11/20/2013   CLINICAL DATA:  Right upper quadrant abdominal pain. Prior history of biliary leak.  EXAM: CT ABDOMEN AND PELVIS WITHOUT CONTRAST  TECHNIQUE: Multidetector CT imaging of the abdomen and pelvis was performed following the standard protocol without intravenous contrast.  COMPARISON:  CT of the abdomen and pelvis 10/08/2013.  FINDINGS: Lung Bases: Unremarkable.  Abdomen/Pelvis: Status post cholecystectomy.  Biliary stent extends from the level of the ampulla into the hepatic hilum in the common bile duct. No description in the gallbladder fossa there is a well-defined 4.0 x 2.7 cm collection the of low to intermediate attenuation material, likely to represent a small biloma. The appearance of the pancreas, spleen, bilateral adrenal glands and bilateral kidneys is unremarkable. Small splenule medial to the spleen, similar to prior examinations.  No significant volume of ascites. No pneumoperitoneum. No pathologic distention of small bowel. No definite lymphadenopathy identified within the abdomen or pelvis. Uterus and ovaries are unremarkable in appearance. Surgical clips in the left adnexal region. Urinary bladder is unremarkable in appearance.  Musculoskeletal: Sclerosis in the anterior aspect of the ilium bilaterally, likely related to prior external fixation device. ORIF in the right ilium adjacent to the sacroiliac joint.  IMPRESSION: 1. Status post cholecystectomy with small 4.0 x 2.7 cm low to intermediate attenuation collection in the gallbladder fossa, presumably a small postoperative biloma. This corresponds to the area of suspected biliary leak on recent nuclear medicine hepatobiliary scan dated 11/08/2013. 2. Common bile duct stent in position, as above. 3. Additional incidental findings, as above, similar prior studies.   Electronically Signed   By: Vinnie Langton M.D.   On: 11/20/2013 15:25    Medications: I have reviewed the patient's current medications.  Assesment: Active Problems:   Chronic constipation   Chronic pain syndrome  Abdominal pain   Abdominal pain, acute Postprostatectomy biliary leak S/P ERCP and stent placement ?UTI Fever  Plan: Medications reviewed As per GI plan Continue pain management Continue Iv antibiotics   LOS: 4 days   Napoleon Monacelli 11/22/2013, 7:51 AM

## 2013-11-23 ENCOUNTER — Inpatient Hospital Stay (HOSPITAL_COMMUNITY): Payer: Medicaid Other

## 2013-11-23 ENCOUNTER — Encounter (HOSPITAL_COMMUNITY): Payer: Self-pay | Admitting: Radiology

## 2013-11-23 LAB — CBC
HCT: 27 % — ABNORMAL LOW (ref 36.0–46.0)
Hemoglobin: 9.1 g/dL — ABNORMAL LOW (ref 12.0–15.0)
MCH: 30.2 pg (ref 26.0–34.0)
MCHC: 33.7 g/dL (ref 30.0–36.0)
MCV: 89.7 fL (ref 78.0–100.0)
PLATELETS: 401 10*3/uL — AB (ref 150–400)
RBC: 3.01 MIL/uL — AB (ref 3.87–5.11)
RDW: 12.5 % (ref 11.5–15.5)
WBC: 13 10*3/uL — AB (ref 4.0–10.5)

## 2013-11-23 MED ORDER — DIPHENHYDRAMINE HCL 50 MG/ML IJ SOLN
25.0000 mg | Freq: Four times a day (QID) | INTRAMUSCULAR | Status: DC
  Administered 2013-11-23 – 2013-11-25 (×8): 25 mg via INTRAVENOUS
  Filled 2013-11-23 (×8): qty 1

## 2013-11-23 MED ORDER — SODIUM CHLORIDE 0.9 % IJ SOLN
10.0000 mL | Freq: Two times a day (BID) | INTRAMUSCULAR | Status: DC
  Administered 2013-11-25: 10 mL

## 2013-11-23 MED ORDER — METRONIDAZOLE IN NACL 5-0.79 MG/ML-% IV SOLN
500.0000 mg | Freq: Four times a day (QID) | INTRAVENOUS | Status: DC
  Administered 2013-11-23 – 2013-11-25 (×8): 500 mg via INTRAVENOUS
  Filled 2013-11-23 (×8): qty 100

## 2013-11-23 MED ORDER — IOHEXOL 300 MG/ML  SOLN
50.0000 mL | Freq: Once | INTRAMUSCULAR | Status: AC | PRN
  Administered 2013-11-23: 50 mL via ORAL

## 2013-11-23 MED ORDER — SODIUM CHLORIDE 0.9 % IJ SOLN: 10.0000 mL | INTRAMUSCULAR | Status: DC | PRN

## 2013-11-23 MED ORDER — IOHEXOL 300 MG/ML  SOLN
100.0000 mL | Freq: Once | INTRAMUSCULAR | Status: AC | PRN
  Administered 2013-11-23: 100 mL via INTRAVENOUS

## 2013-11-23 MED ORDER — IOHEXOL 300 MG/ML  SOLN: 100.0000 mL | Freq: Once | INTRAMUSCULAR | Status: AC | PRN

## 2013-11-23 NOTE — Progress Notes (Signed)
Peripherally Inserted Central Catheter/Midline Placement  The IV Nurse has discussed with the patient and/or persons authorized to consent for the patient, the purpose of this procedure and the potential benefits and risks involved with this procedure.  The benefits include less needle sticks, lab draws from the catheter and patient may be discharged home with the catheter.  Risks include, but not limited to, infection, bleeding, blood clot (thrombus formation), and puncture of an artery; nerve damage and irregular heat beat.  Alternatives to this procedure were also discussed.  PICC/Midline Placement Documentation  PICC / Midline Single Lumen 13/24/40 PICC Right Basilic 41 cm 0 cm (Active)  Indication for Insertion or Continuance of Line Prolonged intravenous therapies;Limited venous access - need for IV therapy >5 days (PICC only) 11/23/2013  2:25 PM  Exposed Catheter (cm) 0 cm 11/23/2013  2:25 PM  Site Assessment Clean;Dry;Intact 11/23/2013  2:25 PM  Line Status Flushed;Saline locked;Capped (central line);Blood return noted 11/23/2013  2:25 PM  Dressing Type Transparent 11/23/2013  2:25 PM  Dressing Status Clean;Dry;Intact 11/23/2013  2:25 PM  Dressing Change Due 11/30/13 11/23/2013  2:25 PM       Roselind Messier 11/23/2013, 3:16 PM

## 2013-11-23 NOTE — Progress Notes (Signed)
Surgery  Pt requests that I no longer participate in her care.  This was discussed with Dr. Laural Golden.  I will therefore sign off.

## 2013-11-23 NOTE — Progress Notes (Signed)
During report I learned that the nurse supervisor would be taking a look to see if a new IV could be started because the one that she has burns when her medicine is pushed through. The present IV site is working fine, but we were still attempting to make the patient more comfortable. The patient asked several times when we were going to get a new IV, and if we would pull someone from ED to come and start the IV. I explained that it was a very busy time as the nurse supervisor was getting medicines from pharmacy for the new admissions and that the ED was full. There was one unsuccessful attempt to get the IV, as the patient is considered a hard stick. Will pass off in report this am to have the IV team start a new IV because the patient is also going to need contrast for a CT of the abdomen today.

## 2013-11-23 NOTE — Progress Notes (Signed)
Patient ID: Catherine Ramirez, female   DOB: Jul 01, 1986, 28 y.o.   MRN: [id number] Alert. Continues to receive 3mg  of Dilaudid every 2 hrs for ruq pain. Rates her pain 8/10. Hurts to breath in. WBC is coming down. Presenting receiving Levaquin and Flagyl IV. CT scan pending to evaluate biloma. Filed Vitals:   11/22/13 0544 11/22/13 1420 11/22/13 2141 11/23/13 0439  BP: 94/57 103/67 103/71 99/64  Pulse: 79 83 107 74  Temp: 99.9 F (37.7 C) 98.3 F (36.8 C) 99.5 F (37.5 C) 98.6 F (37 C)  TempSrc: Oral Oral Oral Oral  Resp: 20 20 20 20   Height:      Weight:      SpO2: 96% 100% 99% 99%   CBC    Component Value Date/Time   WBC 13.0* 11/23/2013 0547   RBC 3.01* 11/23/2013 0547   HGB 9.1* 11/23/2013 0547   HCT 27.0* 11/23/2013 0547   PLT 401* 11/23/2013 0547   MCV 89.7 11/23/2013 0547   MCH 30.2 11/23/2013 0547   MCHC 33.7 11/23/2013 0547   RDW 12.5 11/23/2013 0547   LYMPHSABS 5.0* 11/18/2013 1353   MONOABS 1.5* 11/18/2013 1353   EOSABS 0.2 11/18/2013 1353   BASOSABS 0.0 11/18/2013 1353  Lungs clear. HR regular. Abdomen soft. Tenderness rt upper quadrant.  Assessment/Plan#1. Fever and leukocytosis mostly likely secondary to infected biloma at the gallbladder fossa. She is responding to antibiotic therapy. Temp is coming down and so is WBC. Paln:  Pending CT results. Will continue to monitor.

## 2013-11-23 NOTE — Progress Notes (Signed)
Patient reported having a temp of around 101 last night but has not documented in the flow sheet. I have asked nursing staff to page me if temp goes above 100.5. I have reviewed abdominal CT films and then over the results with patient over the phone. Fluid collection appears slightly smaller which is reassuring. There is no inflammatory change around gallbladder fossa and biliary stent is in place.

## 2013-11-23 NOTE — Progress Notes (Signed)
Subjective: Patient is resting.  She is scheduled for repeat CT Scan of the abdomen. Her fever has subsided.  Objective: Vital signs in last 24 hours: Temp:  [98.3 F (36.8 C)-99.5 F (37.5 C)] 98.6 F (37 C) (04/22 0439) Pulse Rate:  [74-107] 74 (04/22 0439) Resp:  [20] 20 (04/22 0439) BP: (99-103)/(64-71) 99/64 mmHg (04/22 0439) SpO2:  [99 %-100 %] 99 % (04/22 0439) Weight change:  Last BM Date: 11/18/13  Intake/Output from previous day: 04/21 0701 - 04/22 0700 In: 2050.7 [P.O.:600; I.V.:850.7; IV Piggyback:600] Out: 1500 [Urine:1500]  PHYSICAL EXAM General appearance: alert and no distress Resp: clear to auscultation bilaterally Cardio: S1, S2 normal GI: bowel sound ++, tenderness of the RUQ area Extremities: extremities normal, atraumatic, no cyanosis or edema  Lab Results:    @labtest @ [facility] No results found for this basename: PHART, PCO2, PO2ART, TCO2, HCO3,  in the last 72 hours CULTURES Recent Results (from the past 240 hour(s))  URINE CULTURE     Status: None   Collection Time    11/20/13 12:02 PM      Result Value Ref Range Status   Specimen Description URINE, CLEAN CATCH   Final   Special Requests NONE   Final   Culture  Setup Time     Final   Value: 11/20/2013 20:14     Performed at [place]     Final   Value: NO GROWTH     Performed at [facility]   Culture     Final   Value: NO GROWTH     Performed at [facility]   Report Status 11/21/2013 FINAL   Final   Studies/Results: No results found.  Medications: I have reviewed the patient's current medications.  Assesment: Active Problems:   Chronic constipation   Chronic pain syndrome   Abdominal pain   Abdominal pain, acute Postprostatectomy biliary leak S/P ERCP and stent placement ?UTI Fever  Plan: Medications reviewed Continue iv antibiotics Cbc/BMP in Am Ct Scan of the abdomen as planned.   LOS: 5 days   Catherine Ramirez 11/23/2013, 7:41  AM

## 2013-11-23 NOTE — Progress Notes (Signed)
Patient is scheduled to have a CT scan today, when Bambi from radiology came up with the contrast the patient stated that "Dr. Laural Golden told her not to do anything until he saw her". The patient also stated that she had this conversation recorded on her phone. Patient would not stay awake long enough for [place] to finish the conversation with her. Called the operator, and Dr. Laural Golden is not on call this morning. The orders are placed, but once the patients phone was charged she did actually play a recording where she was told no other orders would be placed until she was seen by the doctor. The patient did agree to go ahead and start drinking the contrast to get prepared for the CT today.

## 2013-11-24 LAB — BASIC METABOLIC PANEL
BUN: 12 mg/dL (ref 6–23)
CALCIUM: 8.7 mg/dL (ref 8.4–10.5)
CHLORIDE: 102 meq/L (ref 96–112)
CO2: 27 mEq/L (ref 19–32)
CREATININE: 0.71 mg/dL (ref 0.50–1.10)
GFR calc Af Amer: >90 mL/min (ref >90)
GFR calc non Af Amer: >90 mL/min (ref >90)
Glucose, Bld: 111 mg/dL — ABNORMAL HIGH (ref 70–99)
Potassium: 3.6 mEq/L — ABNORMAL LOW (ref 3.7–5.3)
SODIUM: 140 meq/L (ref 137–147)

## 2013-11-24 LAB — CBC
HCT: 28.4 % — ABNORMAL LOW (ref 36.0–46.0)
Hemoglobin: 9.5 g/dL — ABNORMAL LOW (ref 12.0–15.0)
MCH: 30.2 pg (ref 26.0–34.0)
MCHC: 33.5 g/dL (ref 30.0–36.0)
MCV: 90.2 fL (ref 78.0–100.0)
PLATELETS: 417 10*3/uL — AB (ref 150–400)
RBC: 3.15 MIL/uL — AB (ref 3.87–5.11)
RDW: 12.6 % (ref 11.5–15.5)
WBC: 9.1 10*3/uL (ref 4.0–10.5)

## 2013-11-24 MED ORDER — POTASSIUM CHLORIDE CRYS ER 20 MEQ PO TBCR
20.0000 meq | EXTENDED_RELEASE_TABLET | Freq: Once | ORAL | Status: AC
  Administered 2013-11-24: 20 meq via ORAL
  Filled 2013-11-24: qty 1

## 2013-11-24 NOTE — Progress Notes (Signed)
Subjective: Patient is resting. She is receiving combination IV antibiotics. Her CT scan showed no more leak and the fluid collection is decreasing.  Her leucocytosis also improved..  Objective: Vital signs in last 24 hours: Temp:  [97.9 F (36.6 C)-99 F (37.2 C)] 97.9 F (36.6 C) (04/23 0529) Pulse Rate:  [69-88] 69 (04/23 0529) Resp:  [20] 20 (04/23 0529) BP: (85-103)/(53-68) 90/53 mmHg (04/23 0611) SpO2:  [95 %-100 %] 95 % (04/23 0529) Weight change:  Last BM Date: 11/18/13  Intake/Output from previous day: 04/22 0701 - 04/23 0700 In: 720 [P.O.:720] Out: 2100 [Urine:2100]  PHYSICAL EXAM General appearance: alert and no distress Resp: clear to auscultation bilaterally Cardio: S1, S2 normal GI: bowel sound ++, tenderness of the RUQ area Extremities: extremities normal, atraumatic, no cyanosis or edema  Lab Results:    @labtest @ [facility] No results found for this basename: PHART, PCO2, PO2ART, TCO2, HCO3,  in the last 72 hours CULTURES Recent Results (from the past 240 hour(s))  URINE CULTURE     Status: None   Collection Time    11/20/13 12:02 PM      Result Value Ref Range Status   Specimen Description URINE, CLEAN CATCH   Final   Special Requests NONE   Final   Culture  Setup Time     Final   Value: 11/20/2013 20:14     Performed at [place]     Final   Value: NO GROWTH     Performed at [facility]   Culture     Final   Value: NO GROWTH     Performed at [facility]   Report Status 11/21/2013 FINAL   Final   Studies/Results: Ct Abdomen W Contrast  11/23/2013   CLINICAL DATA:  Bile leak following cholecystectomy.  EXAM: CT ABDOMEN WITH CONTRAST  TECHNIQUE: Multidetector CT imaging of the abdomen was performed using the standard protocol following bolus administration of intravenous contrast.  CONTRAST:  80mL OMNIPAQUE IOHEXOL 300 MG/ML SOLN, 154mL OMNIPAQUE IOHEXOL 300 MG/ML SOLN  COMPARISON:  CT 11/20/2013, ERCP  11/18/2013, HIDA scan 11/18/2013  FINDINGS: Lung bases are clear.  No pericardial fluid.  No focal hepatic lesion. There is a fluid collection within the gallbladder fossa measuring 38 x 29 mm compared to 40 x 27 mm on prior for minimal contraction. There is a stent within the common bile duct. There is no biliary duct dilatation. No fluid along the pericolic gutter proximally.  The pancreas is normal without evidence of inflammation. The spleen, adrenal glands, kidneys normal.  The stomach limited view of the bowel are unremarkable.  IMPRESSION: 1. Slight contraction of the small biloma within the gallbladder fossa. 2. No evidence of active bile leak by CT. 3. Biliary stent appears in good position with without evidence of intrahepatic biliary duct dilatation. 4. The pancreas is normal.   Electronically Signed   By: Suzy Bouchard M.D.   On: 11/23/2013 17:32   Dg Chest Port 1 View  11/23/2013   CLINICAL DATA:  Status post PICC line placement  EXAM: PORTABLE CHEST - 1 VIEW  COMPARISON:  11/18/2013  FINDINGS: Cardiac shadow is within normal limits. The lungs are again clear. No bony abnormality is seen. A new right-sided PICC line is noted with the tip in the right atrium superiorly. This should be withdrawn approximately 2-2.5 cm. No other focal abnormality is noted.  IMPRESSION: PICC line positioning as described. This should be withdrawn as described.  Electronically Signed   By: Inez Catalina M.D.   On: 11/23/2013 15:05    Medications: I have reviewed the patient's current medications.  Assesment: Active Problems:   Chronic constipation   Chronic pain syndrome   Abdominal pain   Abdominal pain, acute Postprostatectomy biliary leak S/P ERCP and stent placement ?UTI Fever  Plan: Medications reviewed Continue iv antibiotics As Per GI recommendation..   LOS: 6 days   Decari Duggar 11/24/2013, 7:55 AM

## 2013-11-24 NOTE — Progress Notes (Signed)
Patient complains of passing tea-colored urine. She denies discomfort. Appetite is fair. She has noticed some nausea but no vomiting. She says she is still having constant pain but it is less than it was yesterday and a lot better compared to the time she had on presentation. She is afebrile. Abdomen is soft with mild tenderness in right lower quadrant with some guarding. WBC 9.1. H&H 9.5 and 28.4 and platelet count 417K Assessment; Biliary leak. Status post stenting and 11/18/2013. Fever and leukocytosis most likely secondary to infected biloma. She is responding to antibiotic therapy. White cell count is normal and she is having less pain. Anemia possibly due to recent illnesses. Patient passing tea colored urine.? Hematuria.  Recommendations; Urine analysis. Patient should be able to go home in the a.m. She will need one more week of by mouth Levaquin and metronidazole. We'll plan to remove stent in 6-8 weeks.

## 2013-11-24 NOTE — Progress Notes (Signed)
Patient ID: Catherine Ramirez, female   DOB: 1985/09/04, 28 y.o.   MRN: [id number] States she feels the same. Rates pain 8/10 in her rt upper quadrant. Pain worse she she breaths. Continues to receive Dilaudid 3mg  every 2-3 hours. No BM x 1 week. Appetite is okay. Some nausea. Underwent a CT yesterday. IMPRESSION:  1. Slight contraction of the small biloma within the gallbladder  fossa.  2. No evidence of active bile leak by CT.  3. Biliary stent appears in good position with without evidence of  intrahepatic biliary duct dilatation.  4. The pancreas is normal.   Filed Vitals:   11/23/13 1440 11/23/13 2231 11/24/13 0529 11/24/13 0611  BP: 103/68 95/67 85/56  90/53  Pulse: 88 74 69   Temp: 99 F (37.2 C) 98.7 F (37.1 C) 97.9 F (36.6 C)   TempSrc: Oral Oral Oral   Resp: 20 20 20    Height:      Weight:      SpO2: 100% 96% 95%     Assessment:   Biliary leak from duct of Luschka.  CT from yesterday: No evidence of biliary leak. Slight contraction of the small biloma within the gallbladder fossa.  WBC ct coming down. Remains without fever. Continues to c/o pain ruq.  Will continue to monitor.

## 2013-11-25 LAB — URINALYSIS, ROUTINE W REFLEX MICROSCOPIC
Bilirubin Urine: NEGATIVE
Glucose, UA: NEGATIVE mg/dL
Hgb urine dipstick: NEGATIVE
KETONES UR: NEGATIVE mg/dL
NITRITE: POSITIVE — AB
PH: 6 (ref 5.0–8.0)
PROTEIN: NEGATIVE mg/dL
Specific Gravity, Urine: 1.025 (ref 1.005–1.030)
Urobilinogen, UA: 0.2 mg/dL (ref 0.0–1.0)

## 2013-11-25 LAB — URINE MICROSCOPIC-ADD ON

## 2013-11-25 MED ORDER — METRONIDAZOLE 500 MG PO TABS: 500.0000 mg | ORAL_TABLET | Freq: Four times a day (QID) | ORAL | Status: DC

## 2013-11-25 MED ORDER — PANTOPRAZOLE SODIUM 40 MG PO TBEC: 40.0000 mg | DELAYED_RELEASE_TABLET | Freq: Every day | ORAL | Status: DC

## 2013-11-25 MED ORDER — LEVOFLOXACIN 750 MG PO TABS: 750.0000 mg | ORAL_TABLET | Freq: Every day | ORAL | Status: DC

## 2013-11-25 MED ORDER — HYDROCODONE-ACETAMINOPHEN 5-325 MG PO TABS: 1.0000 | ORAL_TABLET | Freq: Four times a day (QID) | ORAL | Status: DC | PRN

## 2013-11-25 NOTE — Discharge Summary (Signed)
Physician Discharge Summary  Patient ID: Catherine Ramirez MRN: [id number] DOB/AGE: 1985/08/12 28 y.o. Primary Care Physician:Starlene Consuegra, MD Admit date: 11/18/2013 Discharge date: 11/25/2013    Discharge Diagnoses:   Active Problems:   Chronic constipation   Chronic pain syndrome   Abdominal pain   Abdominal pain, acute Biliary leak S/p ERCP and stent placement    Medication List         Biotin 1000 MCG tablet  Take 1,000 mcg by mouth daily.     DSS 100 MG Caps  Take 100 mg by mouth 2 (two) times daily.     EPIPEN 0.3 mg/0.3 mL Devi  Generic drug:  EPINEPHrine  Inject 0.3 mg into the muscle once.     HYDROcodone-acetaminophen 5-325 MG per tablet  Commonly known as:  NORCO  Take 1 tablet by mouth every 6 (six) hours as needed for moderate pain.     ibuprofen 200 MG tablet  Commonly known as:  ADVIL,MOTRIN  Take 400 mg by mouth every 8 (eight) hours as needed for moderate pain.     levofloxacin 750 MG tablet  Commonly known as:  LEVAQUIN  Take 1 tablet (750 mg total) by mouth daily.     Linaclotide 145 MCG Caps capsule  Commonly known as:  LINZESS  Take 1 capsule (145 mcg total) by mouth daily.     metroNIDAZOLE 500 MG tablet  Commonly known as:  FLAGYL  Take 1 tablet (500 mg total) by mouth 4 (four) times daily.     pantoprazole 40 MG tablet  Commonly known as:  PROTONIX  Take 1 tablet (40 mg total) by mouth at bedtime.     polyethylene glycol packet  Commonly known as:  MIRALAX / GLYCOLAX  Take 17 g by mouth daily.        Discharged Condition: improved    Consults: GI  Significant Diagnostic Studies: Ct Abdomen Pelvis Wo Contrast  11/20/2013   CLINICAL DATA:  Right upper quadrant abdominal pain. Prior history of biliary leak.  EXAM: CT ABDOMEN AND PELVIS WITHOUT CONTRAST  TECHNIQUE: Multidetector CT imaging of the abdomen and pelvis was performed following the standard protocol without intravenous contrast.  COMPARISON:  CT of the abdomen and  pelvis 10/08/2013.  FINDINGS: Lung Bases: Unremarkable.  Abdomen/Pelvis: Status post cholecystectomy. Biliary stent extends from the level of the ampulla into the hepatic hilum in the common bile duct. No description in the gallbladder fossa there is a well-defined 4.0 x 2.7 cm collection the of low to intermediate attenuation material, likely to represent a small biloma. The appearance of the pancreas, spleen, bilateral adrenal glands and bilateral kidneys is unremarkable. Small splenule medial to the spleen, similar to prior examinations.  No significant volume of ascites. No pneumoperitoneum. No pathologic distention of small bowel. No definite lymphadenopathy identified within the abdomen or pelvis. Uterus and ovaries are unremarkable in appearance. Surgical clips in the left adnexal region. Urinary bladder is unremarkable in appearance.  Musculoskeletal: Sclerosis in the anterior aspect of the ilium bilaterally, likely related to prior external fixation device. ORIF in the right ilium adjacent to the sacroiliac joint.  IMPRESSION: 1. Status post cholecystectomy with small 4.0 x 2.7 cm low to intermediate attenuation collection in the gallbladder fossa, presumably a small postoperative biloma. This corresponds to the area of suspected biliary leak on recent nuclear medicine hepatobiliary scan dated 11/08/2013. 2. Common bile duct stent in position, as above. 3. Additional incidental findings, as above, similar prior studies.   Electronically Signed  By: Vinnie Langton M.D.   On: 11/20/2013 15:25   Ct Abdomen W Contrast  11/23/2013   CLINICAL DATA:  Bile leak following cholecystectomy.  EXAM: CT ABDOMEN WITH CONTRAST  TECHNIQUE: Multidetector CT imaging of the abdomen was performed using the standard protocol following bolus administration of intravenous contrast.  CONTRAST:  2mL OMNIPAQUE IOHEXOL 300 MG/ML SOLN, 164mL OMNIPAQUE IOHEXOL 300 MG/ML SOLN  COMPARISON:  CT 11/20/2013, ERCP 11/18/2013, HIDA scan  11/18/2013  FINDINGS: Lung bases are clear.  No pericardial fluid.  No focal hepatic lesion. There is a fluid collection within the gallbladder fossa measuring 38 x 29 mm compared to 40 x 27 mm on prior for minimal contraction. There is a stent within the common bile duct. There is no biliary duct dilatation. No fluid along the pericolic gutter proximally.  The pancreas is normal without evidence of inflammation. The spleen, adrenal glands, kidneys normal.  The stomach limited view of the bowel are unremarkable.  IMPRESSION: 1. Slight contraction of the small biloma within the gallbladder fossa. 2. No evidence of active bile leak by CT. 3. Biliary stent appears in good position with without evidence of intrahepatic biliary duct dilatation. 4. The pancreas is normal.   Electronically Signed   By: Suzy Bouchard M.D.   On: 11/23/2013 17:32   Nm Hepatobiliary Liver Func  11/18/2013   CLINICAL DATA:  Right upper quadrant pain. Cholecystectomy 11/10/2013.  EXAM: NUCLEAR MEDICINE HEPATOBILIARY IMAGING  TECHNIQUE: Sequential images of the abdomen were obtained out to 60 minutes following intravenous administration of radiopharmaceutical.  RADIOPHARMACEUTICALS:  5.0 mCi mCi Tc-65m Choletec  COMPARISON:  No comparisons  FINDINGS: There is prompt uptake and excretion of radiotracer by the liver. There is radiotracer accumulation in the region of the gallbladder fossa which is later noted to spread in the abdomen. Findings are compatible with a biliary leak in the gallbladder fossa.  IMPRESSION: Biliary leak in the gallbladder fossa.  These results were called by telephone at the time of interpretation on 11/18/2013 at 6:50 PM to Dr. Anastasio Champion who verbally acknowledged these results.   Electronically Signed   By: Rolm Baptise M.D.   On: 11/18/2013 18:58   Dg Chest Port 1 View  11/23/2013   CLINICAL DATA:  Status post PICC line placement  EXAM: PORTABLE CHEST - 1 VIEW  COMPARISON:  11/18/2013  FINDINGS: Cardiac shadow is  within normal limits. The lungs are again clear. No bony abnormality is seen. A new right-sided PICC line is noted with the tip in the right atrium superiorly. This should be withdrawn approximately 2-2.5 cm. No other focal abnormality is noted.  IMPRESSION: PICC line positioning as described. This should be withdrawn as described.   Electronically Signed   By: Inez Catalina M.D.   On: 11/23/2013 15:05   Dg Ercp With Sphincterotomy  11/19/2013   CLINICAL DATA:  CBD is stone.  EXAM: ERCP  TECHNIQUE: Multiple spot images obtained with the fluoroscopic device and submitted for interpretation post-procedure.  COMPARISON:  US ABDOMEN COMPLETE dated 10/16/2013; CT ABD - PELV W/ CM dated 10/08/2013; NM HEPATOBILIARY INCLUDE GB dated 11/18/2013  FINDINGS: Multiple images are submitted from ERCP. Opacification of the common bile duct demonstrates normal caliber system. There are least 2 areas of contrast leakage into the gallbladder fossa from right hepatic ducts. Final image demonstrates placement of biliary stent.  IMPRESSION: At least 2 small right hepatic ducts demonstrate leakage into the region of the gallbladder fossa.  These images were submitted for  radiologic interpretation only. Please see the procedural report for the amount of contrast and the fluoroscopy time utilized.   Electronically Signed   By: Rolm Baptise M.D.   On: 11/19/2013 00:15   Dg Abd Acute W/chest  11/18/2013   CLINICAL DATA:  Abdomen pain  EXAM: ACUTE ABDOMEN SERIES (ABDOMEN 2 VIEW & CHEST 1 VIEW)  COMPARISON:  None.  FINDINGS: There is no evidence of dilated bowel loops or free intraperitoneal air. Bowel content is noted throughout colon to No radiopaque calculi or other significant radiographic abnormality is seen. Patient status post prior fixation of the right pelvic bone. Prior cholecystectomy clips are noted. Heart size and mediastinal contours are within normal limits. Both lungs are clear.  IMPRESSION: Negative abdominal radiographs.  Constipation. No acute cardiopulmonary disease.   Electronically Signed   By: Abelardo Diesel M.D.   On: 11/18/2013 15:58   US Abdomen Limited Ruq  11/18/2013   CLINICAL DATA:  Elevated liver function tests. Recent cholecystectomy with continued right upper quadrant pain.  EXAM: US ABDOMEN LIMITED - RIGHT UPPER QUADRANT  COMPARISON:  US ABDOMEN LIMITED RUQ/ASCITES dated 11/14/2013; CT ABD - PELV W/ CM dated 10/08/2013  FINDINGS: Gallbladder:  Surgically absent with small amount of fluid in the gallbladder fossa.  Common bile duct:  Diameter: 5 mm.  No visualized focal filling defect or cut off.  Liver:  Minimal central intrahepatic ductal prominence.  IMPRESSION: Presumed postsurgical evolution of gallbladder fossa appearance.  No common duct dilatation. Mild prominence of the central intrahepatic ducts is noted. MRCP with contrast could be helpful for further evaluation if symptoms continue.  These results were called by telephone at the time of interpretation on 11/18/2013 at 3:21 PM to Dr. Francine Graven , who verbally acknowledged these results.   Electronically Signed   By: Conchita Paris M.D.   On: 11/18/2013 15:21   US Abdomen Limited Ruq  11/14/2013   CLINICAL DATA:  4 days s/p lap. gall bladder  EXAM: US ABDOMEN LIMITED - RIGHT UPPER QUADRANT  COMPARISON:  None.  FINDINGS: Gallbladder:  Status post cholecystectomy.  Gallbladder fossa unremarkable.  Common bile duct:  Diameter: 5.3 mm  Liver:  No focal lesion identified. Within normal limits in parenchymal echogenicity.  IMPRESSION: Negative right upper quadrant ultrasound. Gallbladder fossa is unremarkable.   Electronically Signed   By: Margaree Mackintosh M.D.   On: 11/14/2013 14:10   US Abdomen Limited Ruq  11/08/2013   CLINICAL DATA:  Gallstones  EXAM: US ABDOMEN LIMITED - RIGHT UPPER QUADRANT  COMPARISON:  US ABDOMEN COMPLETE dated 10/16/2013  FINDINGS: Gallbladder:  Multiple mobile gallstones are appreciated within the gallbladder. There is no  gallbladder wall thickening, measuring 2.7 mm. No pericholecystic fluid nor a sonographic Murphy's sign.  Common bile duct:  Diameter: 3.8  Liver:  No focal lesion identified. Within normal limits in parenchymal echogenicity.  IMPRESSION: Cholelithiasis without evidence of cholecystitis.   Electronically Signed   By: Margaree Mackintosh M.D.   On: 11/08/2013 13:36    Lab Results: Basic Metabolic Panel:  Recent Labs  11/24/13 0558  NA 140  K 3.6*  CL 102  CO2 27  GLUCOSE 111*  BUN 12  CREATININE 0.71  CALCIUM 8.7   Liver Function Tests: No results found for this basename: AST, ALT, ALKPHOS, BILITOT, PROT, ALBUMIN,  in the last 72 hours   CBC:  Recent Labs  11/23/13 0547 11/24/13 0558  WBC 13.0* 9.1  HGB 9.1* 9.5*  HCT 27.0* 28.4*  MCV 89.7 90.2  PLT 401* 417*    Recent Results (from the past 240 hour(s))  URINE CULTURE     Status: None   Collection Time    11/20/13 12:02 PM      Result Value Ref Range Status   Specimen Description URINE, CLEAN CATCH   Final   Special Requests NONE   Final   Culture  Setup Time     Final   Value: 11/20/2013 20:14     Performed at [facility]     Final   Value: NO GROWTH     Performed at [facility]   Culture     Final   Value: NO GROWTH     Performed at [facility]   Report Status 11/21/2013 FINAL   Final     Hospital Course:  This is a 28 years old female with history recent cholecystectomy was admitted due to abdominal pain. GI evaluation was done and patient was found to have bile leak.  ERCP and biliary stenting was done by Dr. Laural Golden. The patient was started on combination iv antibiotics. Repeat CT Scan showed that the leak is controlled and the fluid collection is decreasing. Patient over all improved and she is being discharged on oral antibiotics.  Discharge Exam: Blood pressure 80/51, pulse 67, temperature 97.9 F (36.6 C), temperature source Oral, resp. rate 20, height 5\' 6"   (1.676 m), weight 65 kg (143 lb 4.8 oz), last menstrual period 11/11/2013, SpO2 100.00%.   Disposition:  Home         Follow-up Information   Follow up with REHMAN,NAJEEB U, MD In 1 week.   Specialty:  Gastroenterology   Contact information:   [place] [address] [phone]       Follow up with [facility], MD In 4 weeks.   Specialty:  Internal Medicine   Contact information:   [place] [address] [phone]       Signed: [name]   11/25/2013, 7:56 AM

## 2013-11-25 NOTE — Progress Notes (Signed)
Patient discharged home.  PICC line removed - WNL.  Instructed to leave pressure dressing on for 24 hours.  Instructed on new medications and importance of completing doses of flagyl and levaquin.  Verbalizes understanding.  Patient to pick up pain prescription at MDs office.   Patient states she will make appropriate follow up appointments and noted on DC instructions.  No questions at this time, stable to DC home.  Assisted off unit via [place] with RN,

## 2013-11-25 NOTE — Progress Notes (Signed)
Subjective; Patient continues to complain of right upper quadrant pain but states it's slowly getting better. She is asking for pain medication every 2-3 hours. He complains of nausea without vomiting.  Objective; BP 80/51  Pulse 67  Temp(Src) 97.9 F (36.6 C) (Oral)  Resp 20  Ht 5\' 6"  (1.676 m)  Wt 143 lb 4.8 oz (65 kg)  BMI 23.14 kg/m2  SpO2 100%  LMP 11/11/2013 Patient is alert and does not appear to be in acute distress. Abdomen is soft with mild tenderness at RUQ. No organomegaly or masses.  Lab data; Urinalysis from last night revealed positive nitrite and trace leukocytes. 0-2 WBCs 0-2 RBCs.   Assessment; 1. Fever and leukocytosis most likely secondary to infected biloma. She is responding to antibiotics. She's been afebrile for over 2 days and WBC yesterday was normal. #2. Post cholecystectomy delayed biliary leak from duct of [place] also be due to transient CBD obstruction stone that she has spontaneously. Status post biliary stenting on 11/18/2013. #3. Nausea possibly due to narcotic therapy.  Recommendations; Agree with DC plans. Continue Levaquin and metronidazole for one more week. Pain management per Dr. Legrand Rams. OV in 3 weeks. Stent removal in 6-8 weeks.

## 2013-11-25 NOTE — Plan of Care (Signed)
Problem: Phase I Progression Outcomes Goal: Pain controlled with appropriate interventions Outcome: Not Progressing Patient has continued to have 7-8/10 pain throughout admission, MD aware

## 2013-11-26 ENCOUNTER — Inpatient Hospital Stay (HOSPITAL_COMMUNITY)
Admission: EM | Admit: 2013-11-26 | Discharge: 2013-11-30 | DRG: 395 | Disposition: A | Discharge status: Home / Self Care | Payer: Medicaid Other | Attending: Internal Medicine | Admitting: Internal Medicine

## 2013-11-26 ENCOUNTER — Encounter (HOSPITAL_COMMUNITY): Payer: Self-pay | Admitting: Emergency Medicine

## 2013-11-26 DIAGNOSIS — Y836 Removal of other organ (partial) (total) as the cause of abnormal reaction of the patient, or of later complication, without mention of misadventure at the time of the procedure: Secondary | ICD-10-CM | POA: Diagnosis present

## 2013-11-26 DIAGNOSIS — K5900 Constipation, unspecified: Secondary | ICD-10-CM | POA: Diagnosis present

## 2013-11-26 DIAGNOSIS — K838 Other specified diseases of biliary tract: Secondary | ICD-10-CM | POA: Diagnosis present

## 2013-11-26 DIAGNOSIS — K589 Irritable bowel syndrome without diarrhea: Secondary | ICD-10-CM | POA: Diagnosis present

## 2013-11-26 DIAGNOSIS — K929 Disease of digestive system, unspecified: Principal | ICD-10-CM | POA: Diagnosis present

## 2013-11-26 DIAGNOSIS — R109 Unspecified abdominal pain: Secondary | ICD-10-CM

## 2013-11-26 DIAGNOSIS — D72829 Elevated white blood cell count, unspecified: Secondary | ICD-10-CM | POA: Diagnosis present

## 2013-11-26 DIAGNOSIS — F17200 Nicotine dependence, unspecified, uncomplicated: Secondary | ICD-10-CM | POA: Diagnosis present

## 2013-11-26 DIAGNOSIS — G894 Chronic pain syndrome: Secondary | ICD-10-CM | POA: Diagnosis present

## 2013-11-26 DIAGNOSIS — R197 Diarrhea, unspecified: Secondary | ICD-10-CM

## 2013-11-26 LAB — CBC WITH DIFFERENTIAL/PLATELET
Basophils Absolute: 0 10*3/uL (ref 0.0–0.1)
Basophils Relative: 0 % (ref 0–1)
EOS ABS: 0.1 10*3/uL (ref 0.0–0.7)
EOS PCT: 1 % (ref 0–5)
HCT: 34.3 % — ABNORMAL LOW (ref 36.0–46.0)
HEMOGLOBIN: 11.7 g/dL — AB (ref 12.0–15.0)
Lymphocytes Relative: 17 % (ref 12–46)
Lymphs Abs: 1.3 10*3/uL (ref 0.7–4.0)
MCH: 30.4 pg (ref 26.0–34.0)
MCHC: 34.1 g/dL (ref 30.0–36.0)
MCV: 89.1 fL (ref 78.0–100.0)
MONOS PCT: 4 % (ref 3–12)
Monocytes Absolute: 0.3 10*3/uL (ref 0.1–1.0)
Neutro Abs: 6.3 10*3/uL (ref 1.7–7.7)
Neutrophils Relative %: 78 % — ABNORMAL HIGH (ref 43–77)
Platelets: 495 10*3/uL — ABNORMAL HIGH (ref 150–400)
RBC: 3.85 MIL/uL — ABNORMAL LOW (ref 3.87–5.11)
RDW: 12.6 % (ref 11.5–15.5)
WBC: 8 10*3/uL (ref 4.0–10.5)

## 2013-11-26 LAB — COMPREHENSIVE METABOLIC PANEL
ALBUMIN: 3.4 g/dL — AB (ref 3.5–5.2)
ALT: 10 U/L (ref 0–35)
AST: 13 U/L (ref 0–37)
Alkaline Phosphatase: 147 U/L — ABNORMAL HIGH (ref 39–117)
BILIRUBIN TOTAL: 0.3 mg/dL (ref 0.3–1.2)
BUN: 11 mg/dL (ref 6–23)
CALCIUM: 9.5 mg/dL (ref 8.4–10.5)
CO2: 28 mEq/L (ref 19–32)
CREATININE: 0.65 mg/dL (ref 0.50–1.10)
Chloride: 99 mEq/L (ref 96–112)
GFR calc Af Amer: >90 mL/min (ref >90)
GFR calc non Af Amer: >90 mL/min (ref >90)
Glucose, Bld: 105 mg/dL — ABNORMAL HIGH (ref 70–99)
Potassium: 4 mEq/L (ref 3.7–5.3)
Sodium: 138 mEq/L (ref 137–147)
Total Protein: 8.2 g/dL (ref 6.0–8.3)

## 2013-11-26 LAB — LIPASE, BLOOD: LIPASE: 17 U/L (ref 11–59)

## 2013-11-26 MED ORDER — LEVOFLOXACIN IN D5W 500 MG/100ML IV SOLN
INTRAVENOUS | Status: AC
  Filled 2013-11-26: qty 100

## 2013-11-26 MED ORDER — HYDROMORPHONE HCL PF 1 MG/ML IJ SOLN: 1.0000 mg | INTRAMUSCULAR | Status: DC | PRN

## 2013-11-26 MED ORDER — SODIUM CHLORIDE 0.9 % IV SOLN
INTRAVENOUS | Status: DC
  Administered 2013-11-27 – 2013-11-28 (×2): via INTRAVENOUS

## 2013-11-26 MED ORDER — ONDANSETRON HCL 4 MG/2ML IJ SOLN
4.0000 mg | Freq: Once | INTRAMUSCULAR | Status: AC
  Administered 2013-11-26: 4 mg via INTRAVENOUS
  Filled 2013-11-26: qty 2

## 2013-11-26 MED ORDER — OXYCODONE HCL 5 MG PO TABS
5.0000 mg | ORAL_TABLET | ORAL | Status: DC | PRN
  Administered 2013-11-27 – 2013-11-29 (×11): 5 mg via ORAL
  Filled 2013-11-26 (×11): qty 1

## 2013-11-26 MED ORDER — ACETAMINOPHEN 325 MG PO TABS: 650.0000 mg | ORAL_TABLET | Freq: Four times a day (QID) | ORAL | Status: DC | PRN

## 2013-11-26 MED ORDER — ALBUTEROL SULFATE (2.5 MG/3ML) 0.083% IN NEBU: 2.5000 mg | INHALATION_SOLUTION | RESPIRATORY_TRACT | Status: DC | PRN

## 2013-11-26 MED ORDER — HYDROMORPHONE HCL PF 1 MG/ML IJ SOLN
1.0000 mg | Freq: Once | INTRAMUSCULAR | Status: AC
  Administered 2013-11-26: 1 mg via INTRAVENOUS
  Filled 2013-11-26: qty 1

## 2013-11-26 MED ORDER — ACETAMINOPHEN 650 MG RE SUPP: 650.0000 mg | Freq: Four times a day (QID) | RECTAL | Status: DC | PRN

## 2013-11-26 MED ORDER — LEVOFLOXACIN IN D5W 500 MG/100ML IV SOLN
500.0000 mg | INTRAVENOUS | Status: DC
  Administered 2013-11-26 – 2013-11-29 (×4): 500 mg via INTRAVENOUS
  Filled 2013-11-26 (×4): qty 100

## 2013-11-26 MED ORDER — SODIUM CHLORIDE 0.9 % IV BOLUS (SEPSIS)
1000.0000 mL | Freq: Once | INTRAVENOUS | Status: AC
  Administered 2013-11-26: 1000 mL via INTRAVENOUS

## 2013-11-26 MED ORDER — ONDANSETRON HCL 4 MG/2ML IJ SOLN
4.0000 mg | Freq: Four times a day (QID) | INTRAMUSCULAR | Status: DC | PRN
  Administered 2013-11-26 – 2013-11-30 (×7): 4 mg via INTRAVENOUS
  Filled 2013-11-26 (×7): qty 2

## 2013-11-26 MED ORDER — ONDANSETRON HCL 4 MG PO TABS: 4.0000 mg | ORAL_TABLET | Freq: Four times a day (QID) | ORAL | Status: DC | PRN

## 2013-11-26 MED ORDER — DIPHENHYDRAMINE HCL 50 MG/ML IJ SOLN
12.5000 mg | Freq: Four times a day (QID) | INTRAMUSCULAR | Status: DC | PRN
  Administered 2013-11-26 – 2013-11-29 (×9): 12.5 mg via INTRAVENOUS
  Filled 2013-11-26 (×9): qty 1

## 2013-11-26 MED ORDER — HYDROMORPHONE HCL PF 1 MG/ML IJ SOLN
1.0000 mg | INTRAMUSCULAR | Status: DC | PRN
  Administered 2013-11-26 – 2013-11-27 (×2): 2 mg via INTRAVENOUS
  Administered 2013-11-27: 1 mg via INTRAVENOUS
  Administered 2013-11-27 – 2013-11-28 (×10): 2 mg via INTRAVENOUS
  Administered 2013-11-28: 1 mg via INTRAVENOUS
  Administered 2013-11-28 – 2013-11-29 (×3): 2 mg via INTRAVENOUS
  Filled 2013-11-26 (×11): qty 2
  Filled 2013-11-26: qty 1
  Filled 2013-11-26 (×6): qty 2

## 2013-11-26 MED ORDER — METRONIDAZOLE IN NACL 5-0.79 MG/ML-% IV SOLN
500.0000 mg | Freq: Three times a day (TID) | INTRAVENOUS | Status: DC
Start: 2013-11-26 — End: 2013-11-29
  Administered 2013-11-26 – 2013-11-29 (×9): 500 mg via INTRAVENOUS
  Filled 2013-11-26 (×7): qty 100

## 2013-11-26 NOTE — H&P (Signed)
[facility] History and Physical  Catherine Ramirez KSH:[id number] DOB: 08/19/1985 DOA: 11/26/2013   PCP: Avon Gully, MD  Specialists: Dr. Karilyn Cota is her gastroenterologist. She was operated on by Dr. Malvin Johns  Chief Complaint: Abdominal pain with diarrhea  HPI: Catherine Ramirez is a 28 y.o. female with no significant past medical history, who underwent a cholecystectomy for cholelithiasis on April 9. She was readmitted for biliary leak and underwent a biliary stent placement on April 17. Ever since then patient has been having issues with abdominal pain. She was admitted a few days ago with leukocytosis. She was thought to have an intra-abdominal infection. She was placed on antibiotics. She wanted to go home yesterday and so was discharged by Dr. Felecia Shelling. She went home. She was at her mothers place, and started having diarrhea, which persisted all night. It was initially very watery, and subsequently became more soft. She did see some blood after she wiped herself. The pain has been present in the right upper quadrant and has worsened since yesterday. It's similar to the pain that she's been having for the past couple of weeks. It's a continuous pain, but worsens once in a while. 8/10, sharp pain with radiation to the back. Increases with breathing. Has had chills, but no fever. Has been nauseous without any vomiting. Has had a poor oral intake in the last couple days. Denies any dysuria. Denies any vaginal discharge or bleeding.  Home Medications: Prior to Admission medications   Medication Sig Start Date End Date Taking? Authorizing Provider  Biotin 1000 MCG tablet Take 1,000 mcg by mouth daily.   Yes Historical Provider, MD  docusate sodium 100 MG CAPS Take 100 mg by mouth 2 (two) times daily. 11/14/13  Yes Marlane Hatcher, MD  levofloxacin (LEVAQUIN) 750 MG tablet Take 1 tablet (750 mg total) by mouth daily. 11/25/13  Yes Avon Gully, MD  Linaclotide (LINZESS) 145 MCG CAPS capsule  Take 1 capsule (145 mcg total) by mouth daily. 10/18/13  Yes Tilda Burrow, MD  metroNIDAZOLE (FLAGYL) 500 MG tablet Take 1 tablet (500 mg total) by mouth 4 (four) times daily. 11/25/13  Yes Avon Gully, MD  oxyCODONE (OXY IR/ROXICODONE) 5 MG immediate release tablet Take 5 mg by mouth every 6 (six) hours as needed for moderate pain or severe pain.   Yes Historical Provider, MD  pantoprazole (PROTONIX) 40 MG tablet Take 1 tablet (40 mg total) by mouth at bedtime. 11/25/13  Yes Avon Gully, MD  polyethylene glycol (MIRALAX / GLYCOLAX) packet Take 17 g by mouth daily. 11/14/13  Yes Marlane Hatcher, MD  EPINEPHrine (EPIPEN) 0.3 mg/0.3 mL DEVI Inject 0.3 mg into the muscle once.    Historical Provider, MD    Allergies:  Allergies  Allergen Reactions  . Amoxicillin Anaphylaxis, Hives and Rash    Able to take Ancef as prophylaxis without reaction  . Bee Venom Anaphylaxis  . Shellfish Allergy Anaphylaxis  . Iodine Rash  . Other Rash    GRASS  . Penicillins Hives and Rash    Past Medical History: Past Medical History  Diagnosis Date  . Arrhythmia   . Chronic pain syndrome   . IBS (irritable bowel syndrome)   . Chronic constipation   . Chronic pelvic pain in female     Past Surgical History  Procedure Laterality Date  . Cesarean section    . Pelvic fracture surgery    . Tonsillectomy    . Tubal ligation    . Ovarian cyst  removal Left   . Laparoscopic unilateral salpingo oopherectomy Left 10/11/2013    Procedure:  LAPAROSCOPIC LEFT SALPINGO OOPHORECTOMY;  Surgeon: [name], MD;  Location: [facility];  Service: Gynecology;  Laterality: Left;  . Bilateral salpingectomy Right 10/11/2013    Procedure: RIGHT SALPINGECTOMY;  Surgeon: [name], MD;  Location: [facility];  Service: Gynecology;  Laterality: Right;  . Diagnostic laparoscopy with removal of ectopic pregnancy N/A 10/15/2013    Procedure: DIAGNOSTIC LAPAROSCOPY ;  Surgeon: [name], MD;  Location: [facility];  Service:  Gynecology;  Laterality: N/A;  . Cholecystectomy N/A 11/10/2013    Procedure: LAPAROSCOPIC CHOLECYSTECTOMY;  Surgeon: Scherry Ran, MD;  Location: [facility];  Service: General;  Laterality: N/A;  . Ercp N/A 11/18/2013    Procedure: ENDOSCOPIC RETROGRADE CHOLANGIOPANCREATOGRAPHY (ERCP) WITH SMALL SPHINCTEROTOMY;  Surgeon: Rogene Houston, MD;  Location: [facility];  Service: Endoscopy;  Laterality: N/A;    Social History: She lives in [place] with her husband and son. No smoking currently, she quit a few weeks ago. Occasional alcohol intake. No illicit drug use independent with daily activities.  Family History: She reports a history of cancer in the family. Grandmother was recently diagnosed with Alzheimer's dementia  Review of Systems - History obtained from the patient General ROS: positive for  - chills and fatigue Psychological ROS: negative Ophthalmic ROS: negative ENT ROS: negative Allergy and Immunology ROS: negative Hematological and Lymphatic ROS: negative Endocrine ROS: negative Respiratory ROS: no cough, shortness of breath, or wheezing Cardiovascular ROS: no chest pain or dyspnea on exertion Gastrointestinal ROS: as in hpi Genito-Urinary ROS: no dysuria, trouble voiding, or hematuria Musculoskeletal ROS: negative Neurological ROS: no TIA or stroke symptoms Dermatological ROS: negative  Physical Examination  Filed Vitals:   11/26/13 2055 11/26/13 2156  BP: 101/77 100/58  Pulse: 72 78  Temp: 98 F (36.7 C)   TempSrc: Oral   Resp: 22 16  SpO2: 100% 96%    BP 100/58  Pulse 78  Temp(Src) 98 F (36.7 C) (Oral)  Resp 16  SpO2 96%  LMP 11/11/2013  General appearance: alert, cooperative, appears stated age and no distress Head: Normocephalic, without obvious abnormality, atraumatic Eyes: conjunctivae/corneas clear. PERRL, EOM's intact Throat: dry mm. no lesions noted. Resp: clear to auscultation bilaterally Cardio: regular rate and rhythm, S1, S2 normal, no  murmur, click, rub or gallop GI: Abdomen is soft. Scars from recent surgery noted. Tenderness is present mainly in the upper abdomen, more so on the right upper squadron. No rebound, rigidity, or guarding. No masses, or organomegaly. Bowel sounds present. Rectal examination was done, which did not reveal any obvious abnormality. Yellow stool, which was heme-negative. Extremities: extremities normal, atraumatic, no cyanosis or edema Pulses: 2+ and symmetric Skin: Skin color, texture, turgor normal. No rashes or lesions Lymph nodes: Cervical, supraclavicular, and axillary nodes normal. Neurologic: She is alert and oriented x3. No focal neurological deficits are noted.  Laboratory Data: Results for orders placed during the hospital encounter of 11/26/13 (from the past 48 hour(s))  CBC WITH DIFFERENTIAL     Status: Abnormal   Collection Time    11/26/13  8:24 PM      Result Value Ref Range   WBC 8.0  4.0 - 10.5 K/uL   RBC 3.85 (*) 3.87 - 5.11 MIL/uL   Hemoglobin 11.7 (*) 12.0 - 15.0 g/dL   HCT 34.3 (*) 36.0 - 46.0 %   MCV 89.1  78.0 - 100.0 fL   MCH 30.4  26.0 - 34.0 pg   MCHC 34.1  30.0 - 36.0 g/dL   RDW 12.6  11.5 - 15.5 %   Platelets 495 (*) 150 - 400 K/uL   Neutrophils Relative % 78 (*) 43 - 77 %   Neutro Abs 6.3  1.7 - 7.7 K/uL   Lymphocytes Relative 17  12 - 46 %   Lymphs Abs 1.3  0.7 - 4.0 K/uL   Monocytes Relative 4  3 - 12 %   Monocytes Absolute 0.3  0.1 - 1.0 K/uL   Eosinophils Relative 1  0 - 5 %   Eosinophils Absolute 0.1  0.0 - 0.7 K/uL   Basophils Relative 0  0 - 1 %   Basophils Absolute 0.0  0.0 - 0.1 K/uL  COMPREHENSIVE METABOLIC PANEL     Status: Abnormal   Collection Time    11/26/13  8:24 PM      Result Value Ref Range   Sodium 138  137 - 147 mEq/L   Potassium 4.0  3.7 - 5.3 mEq/L   Chloride 99  96 - 112 mEq/L   CO2 28  19 - 32 mEq/L   Glucose, Bld 105 (*) 70 - 99 mg/dL   BUN 11  6 - 23 mg/dL   Creatinine, Ser 0.65  0.50 - 1.10 mg/dL   Calcium 9.5  8.4 -  10.5 mg/dL   Total Protein 8.2  6.0 - 8.3 g/dL   Albumin 3.4 (*) 3.5 - 5.2 g/dL   AST 13  0 - 37 U/L   ALT 10  0 - 35 U/L   Alkaline Phosphatase 147 (*) 39 - 117 U/L   Total Bilirubin 0.3  0.3 - 1.2 mg/dL   GFR calc non Af Amer >90  >90 mL/min   GFR calc Af Amer >90  >90 mL/min   Comment: (NOTE)     The eGFR has been calculated using the CKD EPI equation.     This calculation has not been validated in all clinical situations.     eGFR's persistently <90 mL/min signify possible Chronic Kidney     Disease.  LIPASE, BLOOD     Status: None   Collection Time    11/26/13  8:24 PM      Result Value Ref Range   Lipase 17  11 - 59 U/L    Radiology Reports: No results found.   Problem List  Principal Problem:   Abdominal pain   Assessment: This is an unfortunate 28 year old female who continues to have persistent abdominal pain after undergoing cholecystectomy a few weeks ago. She has a biliary stent in place for biliary leak. She was discharged just yesterday and presents back today due to persistent abdominal pain, and diarrhea. She has a mildly elevated alkaline phosphatase, but other LFTs including bilirubin are normal.  Plan: #1 abdominal pain in the right upper quadrant in the setting of recent cholecystectomy, bile leak, and biliary stent placement: LFTs are within reasonable limits. Case has been discussed by the ED physician with Dr. Trinda Pascal, gastroenterology. She recommends admission to the hospital and consultation by GI tomorrow. No need for imaging studies at this time. She just had a CT scan 2 days ago which showed decrease in the size of the bilioma. Biliary Stent was in good position. No active leak was noted.. She'll be continued on her Levaquin and Flagyl that was initiated for presumed intra-abdominal infection.  #2 acute diarrhea: Considering recent hospitalization she needs to be ruled out  for C. difficile.  #3 dehydration: Most likely secondary to diarrhea. Give  her IV fluids.  Check urine pregnancy test.  DVT Prophylaxis: SCDs Code Status: Full code Family Communication: Discussed with the patient  Disposition Plan: Admit to [facility]. Dr. Legrand Rams to assume care in the morning.   Further management decisions will depend on results of further testing and patient's response to treatment.   Bonnielee Haff  [facility] Pager [phone]  If 7PM-7AM, please contact night-coverage [email or web address] Password [facility]  11/26/2013, 10:03 PM

## 2013-11-26 NOTE — ED Notes (Signed)
Dr Zammit at bedside. 

## 2013-11-26 NOTE — ED Provider Notes (Signed)
CSN: [id number]     Arrival date & time 11/26/13  1824 History  This chart was scribed for Catherine Diego, MD by Randa Evens, ED Scribe. This patient was seen in room [facility]16A/[facility]16A and the patient's care was started at 7:40 PM.  Chief Complaint  Patient presents with  . Abdominal Pain   Patient is a 28 y.o. female presenting with abdominal pain. The history is provided by the patient. No language interpreter was used.  Abdominal Pain Pain location:  RUQ Pain radiates to:  Does not radiate Pain severity:  Moderate Onset quality:  Gradual Duration:  2 weeks Timing:  Intermittent Progression:  Waxing and waning Chronicity:  New Relieved by:  Nothing Worsened by:  Nothing tried Ineffective treatments:  None tried Associated symptoms: diarrhea and nausea   Associated symptoms: no chest pain, no cough, no fatigue and no hematuria    HPI Comments: Catherine Ramirez is a 28 y.o. female with a h/o of IBS who presents to the Emergency Department complaining of intermittent RUQ abdominal pain for 2 weeks. She states on 11/10/13 she had a cholecystectomy performed by Dr. [name]. She also states that she was admitted into the hospital for a week for similar symptoms and was discharged yesterday and after she insisted. She state she was diagnosed with a bowel leakage. She was discharged with medications.   GI- Dr. Corbin Ade  Past Medical History  Diagnosis Date  . Arrhythmia   . Chronic pain syndrome   . IBS (irritable bowel syndrome)   . Chronic constipation   . Chronic pelvic pain in female    Past Surgical History  Procedure Laterality Date  . Cesarean section    . Pelvic fracture surgery    . Tonsillectomy    . Tubal ligation    . Ovarian cyst removal Left   . Laparoscopic unilateral salpingo oopherectomy Left 10/11/2013    Procedure:  LAPAROSCOPIC LEFT SALPINGO OOPHORECTOMY;  Surgeon: [name], MD;  Location: [facility];  Service: Gynecology;  Laterality: Left;  . Bilateral  salpingectomy Right 10/11/2013    Procedure: RIGHT SALPINGECTOMY;  Surgeon: [name], MD;  Location: [facility];  Service: Gynecology;  Laterality: Right;  . Diagnostic laparoscopy with removal of ectopic pregnancy N/A 10/15/2013    Procedure: DIAGNOSTIC LAPAROSCOPY ;  Surgeon: [name], MD;  Location: [facility];  Service: Gynecology;  Laterality: N/A;  . Cholecystectomy N/A 11/10/2013    Procedure: LAPAROSCOPIC CHOLECYSTECTOMY;  Surgeon: Scherry Ran, MD;  Location: [facility];  Service: General;  Laterality: N/A;  . Ercp N/A 11/18/2013    Procedure: ENDOSCOPIC RETROGRADE CHOLANGIOPANCREATOGRAPHY (ERCP) WITH SMALL SPHINCTEROTOMY;  Surgeon: Rogene Houston, MD;  Location: [facility];  Service: Endoscopy;  Laterality: N/A;   History reviewed. No pertinent family history. History  Substance Use Topics  . Smoking status: Current Every Day Smoker -- 1.00 packs/day for 2 years    Types: Cigarettes  . Smokeless tobacco: Never Used  . Alcohol Use: Yes     Comment: socially   OB History   Grav Para Term Preterm Abortions TAB SAB Ect Mult Living                 Review of Systems  Constitutional: Negative for appetite change and fatigue.  HENT: Negative for congestion, ear discharge and sinus pressure.   Eyes: Negative for discharge.  Respiratory: Negative for cough.   Cardiovascular: Negative for chest pain.  Gastrointestinal: Positive for nausea, abdominal pain and diarrhea.  Genitourinary: Negative for frequency and hematuria.  Musculoskeletal: Negative for back pain.  Skin: Negative for rash.  Neurological: Negative for seizures and headaches.  Psychiatric/Behavioral: Negative for hallucinations.      Allergies  Amoxicillin; Bee venom; Shellfish allergy; Iodine; Other; and Penicillins  Home Medications   Prior to Admission medications   Medication Sig Start Date End Date Taking? Authorizing Provider  Biotin 1000 MCG tablet Take 1,000 mcg by mouth daily.   Yes Historical  Provider, MD  docusate sodium 100 MG CAPS Take 100 mg by mouth 2 (two) times daily. 11/14/13  Yes Scherry Ran, MD  levofloxacin (LEVAQUIN) 750 MG tablet Take 1 tablet (750 mg total) by mouth daily. 11/25/13  Yes [name], MD  Linaclotide (LINZESS) 145 MCG CAPS capsule Take 1 capsule (145 mcg total) by mouth daily. 10/18/13  Yes [name], MD  metroNIDAZOLE (FLAGYL) 500 MG tablet Take 1 tablet (500 mg total) by mouth 4 (four) times daily. 11/25/13  Yes [name], MD  oxyCODONE (OXY IR/ROXICODONE) 5 MG immediate release tablet Take 5 mg by mouth every 6 (six) hours as needed for moderate pain or severe pain.   Yes Historical Provider, MD  pantoprazole (PROTONIX) 40 MG tablet Take 1 tablet (40 mg total) by mouth at bedtime. 11/25/13  Yes [name], MD  polyethylene glycol (MIRALAX / GLYCOLAX) packet Take 17 g by mouth daily. 11/14/13  Yes Scherry Ran, MD  EPINEPHrine (EPIPEN) 0.3 mg/0.3 mL DEVI Inject 0.3 mg into the muscle once.    Historical Provider, MD   BP 101/77  Pulse 72  Temp(Src) 98 F (36.7 C) (Oral)  Resp 22  SpO2 100%  LMP 11/11/2013  Physical Exam  Nursing note and vitals reviewed. Constitutional: She is oriented to person, place, and time. She appears well-developed.  HENT:  Head: Normocephalic.  Eyes: Conjunctivae and EOM are normal. No scleral icterus.  Neck: Neck supple. No thyromegaly present.  Cardiovascular: Normal rate and regular rhythm.  Exam reveals no gallop and no friction rub.   No murmur heard. Pulmonary/Chest: No stridor. She has no wheezes. She has no rales. She exhibits no tenderness.  Abdominal: She exhibits no distension. There is tenderness. There is no rebound.  Moderate RUQ tenderness  Musculoskeletal: Normal range of motion. She exhibits no edema.  Lymphadenopathy:    She has no cervical adenopathy.  Neurological: She is oriented to person, place, and time. She exhibits normal muscle tone. Coordination normal.  Skin: No  rash noted. No erythema.  Psychiatric: She has a normal mood and affect. Her behavior is normal.    ED Course  Procedures (including critical care time) DIAGNOSTIC STUDIES:   COORDINATION OF CARE: 7:45 PM-Discussed treatment plan which includes medications, CBC panel, CMP, Lipase with pt at bedside and pt agreed to plan.     Labs Review Labs Reviewed  CBC WITH DIFFERENTIAL - Abnormal; Notable for the following:    RBC 3.85 (*)    Hemoglobin 11.7 (*)    HCT 34.3 (*)    Platelets 495 (*)    Neutrophils Relative % 78 (*)    All other components within normal limits  COMPREHENSIVE METABOLIC PANEL - Abnormal; Notable for the following:    Glucose, Bld 105 (*)    Albumin 3.4 (*)    Alkaline Phosphatase 147 (*)    All other components within normal limits  LIPASE, BLOOD    Imaging Review No results found.   EKG Interpretation None  I spoke with dr. Oneida Alar and the pt will be admitted to obs to medicine.   MDM   Final diagnoses:  None   The chart was scribed for me under my direct supervision.  I personally performed the history, physical, and medical decision making and all procedures in the evaluation of this patient..  The chart was scribed for me under my direct supervision.  I personally performed the history, physical, and medical decision making and all procedures in the evaluation of this patient.Catherine Diego, MD 11/26/13 2141

## 2013-11-26 NOTE — ED Notes (Signed)
Pt c/o continued abdominal pain, nausea, and diarrhea after being discharged from the hospital yesterday.

## 2013-11-27 LAB — CBC
HCT: 28.5 % — ABNORMAL LOW (ref 36.0–46.0)
Hemoglobin: 9.4 g/dL — ABNORMAL LOW (ref 12.0–15.0)
MCH: 29.7 pg (ref 26.0–34.0)
MCHC: 33 g/dL (ref 30.0–36.0)
MCV: 89.9 fL (ref 78.0–100.0)
Platelets: 394 10*3/uL (ref 150–400)
RBC: 3.17 MIL/uL — ABNORMAL LOW (ref 3.87–5.11)
RDW: 12.6 % (ref 11.5–15.5)
WBC: 9.1 10*3/uL (ref 4.0–10.5)

## 2013-11-27 LAB — COMPREHENSIVE METABOLIC PANEL
ALBUMIN: 2.6 g/dL — AB (ref 3.5–5.2)
ALK PHOS: 116 U/L (ref 39–117)
ALT: 9 U/L (ref 0–35)
AST: 9 U/L (ref 0–37)
BUN: 12 mg/dL (ref 6–23)
CHLORIDE: 104 meq/L (ref 96–112)
CO2: 28 mEq/L (ref 19–32)
Calcium: 8.5 mg/dL (ref 8.4–10.5)
Creatinine, Ser: 0.71 mg/dL (ref 0.50–1.10)
GFR calc Af Amer: >90 mL/min (ref >90)
GFR calc non Af Amer: >90 mL/min (ref >90)
Glucose, Bld: 95 mg/dL (ref 70–99)
Potassium: 4 mEq/L (ref 3.7–5.3)
SODIUM: 139 meq/L (ref 137–147)
Total Bilirubin: 0.2 mg/dL — ABNORMAL LOW (ref 0.3–1.2)
Total Protein: 6.6 g/dL (ref 6.0–8.3)

## 2013-11-27 LAB — CLOSTRIDIUM DIFFICILE BY PCR: Toxigenic C. Difficile by PCR: NEGATIVE

## 2013-11-27 LAB — PREGNANCY, URINE: PREG TEST UR: NEGATIVE

## 2013-11-27 MED ORDER — COLESTIPOL HCL 1 G PO TABS
1.0000 g | ORAL_TABLET | Freq: Three times a day (TID) | ORAL | Status: DC
  Administered 2013-11-27 – 2013-11-30 (×9): 1 g via ORAL
  Filled 2013-11-27 (×15): qty 1

## 2013-11-27 MED ORDER — LEVOFLOXACIN IN D5W 500 MG/100ML IV SOLN
INTRAVENOUS | Status: AC
  Filled 2013-11-27: qty 100

## 2013-11-27 NOTE — Progress Notes (Signed)
Subjective: Patient is readmitted due to abdominal pain and diarrhea. Stool for  C.diffici is negative  Objective: Vital signs in last 24 hours: Temp:  [97 F (36.1 C)-98 F (36.7 C)] 97 F (36.1 C) (04/26 1341) Pulse Rate:  [57-78] 59 (04/26 1341) Resp:  [16-22] 20 (04/26 1341) BP: (91-101)/(55-77) 99/66 mmHg (04/26 1341) SpO2:  [96 %-100 %] 97 % (04/26 1341) Weight:  [60.3 kg (132 lb 15 oz)-65.772 kg (145 lb)] 65.772 kg (145 lb) (04/26 8938) Weight change:  Last BM Date: 11/26/13  Intake/Output from previous day: 04/25 0701 - 04/26 0700 In: 525 [I.V.:325; IV Piggyback:200] Out: -   PHYSICAL EXAM General appearance: alert and no distress Resp: clear to auscultation bilaterally Cardio: regular rate and rhythm, S1, S2 normal, no murmur, click, rub or gallop GI: soft, non-tender; bowel sounds normal; no masses,  no organomegaly Extremities: extremities normal, atraumatic, no cyanosis or edema  Lab Results:    @labtest @ [facility] No results found for this basename: PHART, PCO2, PO2ART, TCO2, HCO3,  in the last 72 hours CULTURES Recent Results (from the past 240 hour(s))  URINE CULTURE     Status: None   Collection Time    11/20/13 12:02 PM      Result Value Ref Range Status   Specimen Description URINE, CLEAN CATCH   Final   Special Requests NONE   Final   Culture  Setup Time     Final   Value: 11/20/2013 20:14     Performed at [place]     Final   Value: NO GROWTH     Performed at [place] [facility]     Final   Value: NO GROWTH     Performed at [facility]   Report Status 11/21/2013 FINAL   Final  CLOSTRIDIUM DIFFICILE BY PCR     Status: None   Collection Time    11/27/13  4:10 PM      Result Value Ref Range Status   C difficile by pcr NEGATIVE  NEGATIVE Final   Studies/Results: No results found.  Medications: I have reviewed the patient's current medications.  Assesment: Principal Problem:   Abdominal  pain Active Problems:   Acute diarrhea s/p cholecystectomy chroni pain syndrome   Plan: Medications reviewed Will continue current treatment GI consult appreciated Will do surgical consult    LOS: 1 day   Rhyli Depaula 11/27/2013, 8:13 PM

## 2013-11-27 NOTE — Consult Note (Addendum)
Referring Provider: No ref. provider found Primary Care Physician:  [name], MD Primary Gastroenterologist:  DR. Otelia Limes  Reason for Consultation:  ABD PAIN  HPI:  PT S/P CHOLECYSTECTOMY APR 9 COMPLICATED BY A BILE LEAK. ERCP/STENT APR 17. D/C FRI AND RETURNED TO ED SAT APR 25 DUE TO ABD PAIN AND MODERATE TO SEVERE NAUSEA/DIARRHEA.. CT APR 25 SHOWS BILOMA, NO BILE LEAK. PT DISMISSED DR. BRADFORD. HAS BEEN NPO AND NOW NOT HAVING DIARRHEA.SHE WAS NOT HAVING DIARRHEA PRIOR TO GALLBLADDER BEING REMOVED. PAIN IN LOCATED UNDER HER R RIB CAGE AND RADIATES TO HER RIGHT BACK.  PT DENIES FEVER, CHILLS, BRBPR, vomiting, melena, constipation, problems swallowing, OR heartburn or indigestion.  Past Medical History  Diagnosis Date  . Arrhythmia   . Chronic pain syndrome   . IBS (irritable bowel syndrome)   . Chronic constipation   . Chronic pelvic pain in female     Past Surgical History  Procedure Laterality Date  . Cesarean section    . Pelvic fracture surgery    . Tonsillectomy    . Tubal ligation    . Ovarian cyst removal Left   . Laparoscopic unilateral salpingo oopherectomy Left 10/11/2013    Procedure:  LAPAROSCOPIC LEFT SALPINGO OOPHORECTOMY;  Surgeon: [name], MD;  Location: [facility];  Service: Gynecology;  Laterality: Left;  . Bilateral salpingectomy Right 10/11/2013    Procedure: RIGHT SALPINGECTOMY;  Surgeon: [name], MD;  Location: [facility];  Service: Gynecology;  Laterality: Right;  . Diagnostic laparoscopy with removal of ectopic pregnancy N/A 10/15/2013    Procedure: DIAGNOSTIC LAPAROSCOPY ;  Surgeon: [name], MD;  Location: [facility];  Service: Gynecology;  Laterality: N/A;  . Cholecystectomy N/A 11/10/2013    Procedure: LAPAROSCOPIC CHOLECYSTECTOMY;  Surgeon: Scherry Ran, MD;  Location: [facility];  Service: General;  Laterality: N/A;  . Ercp N/A 11/18/2013    Procedure: ENDOSCOPIC RETROGRADE CHOLANGIOPANCREATOGRAPHY (ERCP) WITH SMALL SPHINCTEROTOMY;   Surgeon: Rogene Houston, MD;  Location: [facility];  Service: Endoscopy;  Laterality: N/A;    Prior to Admission medications   Medication Sig Start Date End Date Taking? Authorizing Provider  Biotin 1000 MCG tablet Take 1,000 mcg by mouth daily.   Yes Historical Provider, MD  docusate sodium 100 MG CAPS Take 100 mg by mouth 2 (two) times daily. 11/14/13  Yes Scherry Ran, MD  levofloxacin (LEVAQUIN) 750 MG tablet Take 1 tablet (750 mg total) by mouth daily. 11/25/13  Yes [name], MD  Linaclotide (LINZESS) 145 MCG CAPS capsule Take 1 capsule (145 mcg total) by mouth daily. 10/18/13  Yes [name], MD  metroNIDAZOLE (FLAGYL) 500 MG tablet Take 1 tablet (500 mg total) by mouth 4 (four) times daily. 11/25/13  Yes [name], MD  oxyCODONE (OXY IR/ROXICODONE) 5 MG immediate release tablet Take 5 mg by mouth every 6 (six) hours as needed for moderate pain or severe pain.   Yes Historical Provider, MD  pantoprazole (PROTONIX) 40 MG tablet Take 1 tablet (40 mg total) by mouth at bedtime. 11/25/13  Yes [name], MD  polyethylene glycol (MIRALAX / GLYCOLAX) packet Take 17 g by mouth daily. 11/14/13  Yes Scherry Ran, MD  EPINEPHrine (EPIPEN) 0.3 mg/0.3 mL DEVI Inject 0.3 mg into the muscle once.    Historical Provider, MD    Current Facility-Administered Medications  Medication Dose Route Frequency Provider Last Rate Last Dose  . 0.9 %  sodium chloride infusion   Intravenous Continuous Bonnielee Haff, MD 100  mL/hr at 11/26/13 2245 100 mL at 11/26/13 2245  . acetaminophen (TYLENOL) tablet 650 mg  650 mg Oral Q6H PRN Bonnielee Haff, MD       Or  . acetaminophen (TYLENOL) suppository 650 mg  650 mg Rectal Q6H PRN Bonnielee Haff, MD      . albuterol (PROVENTIL) (2.5 MG/3ML) 0.083% nebulizer solution 2.5 mg  2.5 mg Nebulization Q2H PRN Bonnielee Haff, MD      . diphenhydrAMINE (BENADRYL) injection 12.5 mg  12.5 mg Intravenous Q6H PRN Bonnielee Haff, MD   12.5 mg at 11/27/13 0618  .  HYDROmorphone (DILAUDID) injection 1-2 mg  1-2 mg Intravenous Q3H PRN Bonnielee Haff, MD   1 mg at 11/27/13 0618  . levofloxacin (LEVAQUIN) IVPB 500 mg  500 mg Intravenous Q24H Bonnielee Haff, MD   500 mg at 11/26/13 2245  . metroNIDAZOLE (FLAGYL) IVPB 500 mg  500 mg Intravenous Q8H Bonnielee Haff, MD   500 mg at 11/27/13 0544  . ondansetron (ZOFRAN) tablet 4 mg  4 mg Oral Q6H PRN Bonnielee Haff, MD       Or  . ondansetron [facility]) injection 4 mg  4 mg Intravenous Q6H PRN Bonnielee Haff, MD   4 mg at 11/27/13 0618  . oxyCODONE (Oxy IR/ROXICODONE) immediate release tablet 5 mg  5 mg Oral Q4H PRN Bonnielee Haff, MD   5 mg at 11/27/13 0544   Facility-Administered Medications Ordered in Other Encounters  Medication Dose Route Frequency Provider Last Rate Last Dose  . dextrose 5 %-0.45 % sodium chloride infusion   Intravenous Continuous [name], MD 100 mL/hr at 10/12/13 1714    . pneumococcal 23 valent vaccine (PNU-IMMUNE) injection 0.5 mL  0.5 mL Intramuscular Tomorrow-1000 [name], MD        Allergies as of 11/26/2013 - Review Complete 11/26/2013  Allergen Reaction Noted  . Amoxicillin Anaphylaxis, Hives, and Rash 04/05/2008  . Bee venom Anaphylaxis 01/28/2012  . Shellfish allergy Anaphylaxis 01/28/2012  . Iodine Rash 10/11/2013  . Other Rash 01/28/2012  . Penicillins Hives and Rash       History   Social History  . Marital Status: Married    Spouse Name: N/A    Number of Children: N/A  . Years of Education: N/A   Social History Main Topics  . Smoking status: Current Every Day Smoker -- 1.00 packs/day for 2 years    Types: Cigarettes  . Smokeless tobacco: Never Used  . Alcohol Use: Yes     Comment: socially  . Drug Use: No  . Sexual Activity: Yes    Birth Control/ Protection: None   Review of Systems: PER HPI OTHERWISE ALL SYSTEMS ARE NEGATIVE.g.  Vitals: Blood pressure 91/55, pulse 57, temperature 97.1 F (36.2 C), temperature source Oral, resp. rate 20,  height 5\' 6"  (1.676 m), weight 145 lb (65.772 kg), last menstrual period 11/11/2013, SpO2 100.00%.  Physical Exam: General:   Alert,  well-nourished, pleasant and cooperative in MILD DISTRESS Head:  Normocephalic and atraumatic. Eyes:  Sclera clear, no icterus.   Conjunctiva pink. Mouth:  No deformity or lesions,  Neck:  Supple; no masses Lungs:  Clear throughout to auscultation.   No wheezes. No acute distress. Heart:  Regular rate and rhythm; no murmurs. Abdomen:  Soft, MOD TTP IN RUQ, nondistended. No masses noted. Normal bowel sounds, with mild guarding, and without rebound.   Msk:  Symmetrical without gross deformities. Normal posture. Extremities:  Without edema. Neurologic:  Alert and  oriented x4;  grossly normal neurologically. Cervical Nodes:  No significant cervical adenopathy. Psych:  Alert and cooperative. Normal mood and flat affect.   Lab Results:  Recent Labs  11/26/13 2024 11/27/13 0532  WBC 8.0 9.1  HGB 11.7* 9.4*  HCT 34.3* 28.5*  PLT 495* 394   BMET  Recent Labs  11/26/13 2024 11/27/13 0532  NA 138 139  K 4.0 4.0  CL 99 104  CO2 28 28  GLUCOSE 105* 95  BUN 11 12  CREATININE 0.65 0.71  CALCIUM 9.5 8.5   LFT  Recent Labs  11/27/13 0532  PROT 6.6  ALBUMIN 2.6*  AST 9  ALT 9  ALKPHOS 116  BILITOT 0.2*   C DIFF PCR PENDING  Studies/Results: PER HPI  Impression: ADMITTED WITH ABD PAIN IN PRESENCE OF A BILOMA. LOOSE STOOLS MOST LIKELY DUE TO BILE SALT INDUCED DIARRHEA.  Plan: 1. WOULD CONSULT SURGERY TO MANAGE POST-OP COURSE. 2. AGREE WITH LEV/FLAG. 3. PAIN CONTROL 4. NO ENDOSCOPIC INTERVENTION NEEDED AT THIS TIME.  5. FULL LIQUID DIET. ADD COLESTID.   LOS: 1 day   Sandi L Fields  11/27/2013, 8:10 AM   4

## 2013-11-28 DIAGNOSIS — R197 Diarrhea, unspecified: Secondary | ICD-10-CM

## 2013-11-28 DIAGNOSIS — R110 Nausea: Secondary | ICD-10-CM

## 2013-11-28 DIAGNOSIS — Z9889 Other specified postprocedural states: Secondary | ICD-10-CM

## 2013-11-28 DIAGNOSIS — R109 Unspecified abdominal pain: Secondary | ICD-10-CM

## 2013-11-28 LAB — BASIC METABOLIC PANEL
BUN: 8 mg/dL (ref 6–23)
CO2: 27 mEq/L (ref 19–32)
Calcium: 8.6 mg/dL (ref 8.4–10.5)
Chloride: 104 mEq/L (ref 96–112)
Creatinine, Ser: 0.71 mg/dL (ref 0.50–1.10)
Glucose, Bld: 93 mg/dL (ref 70–99)
POTASSIUM: 3.6 meq/L — AB (ref 3.7–5.3)
SODIUM: 141 meq/L (ref 137–147)

## 2013-11-28 LAB — CBC
HCT: 29.2 % — ABNORMAL LOW (ref 36.0–46.0)
Hemoglobin: 9.6 g/dL — ABNORMAL LOW (ref 12.0–15.0)
MCH: 29.8 pg (ref 26.0–34.0)
MCHC: 32.9 g/dL (ref 30.0–36.0)
MCV: 90.7 fL (ref 78.0–100.0)
PLATELETS: 409 10*3/uL — AB (ref 150–400)
RBC: 3.22 MIL/uL — ABNORMAL LOW (ref 3.87–5.11)
RDW: 12.9 % (ref 11.5–15.5)
WBC: 7.9 10*3/uL (ref 4.0–10.5)

## 2013-11-28 MED ORDER — ALBUTEROL SULFATE (2.5 MG/3ML) 0.083% IN NEBU: 2.5000 mg | INHALATION_SOLUTION | Freq: Four times a day (QID) | RESPIRATORY_TRACT | Status: DC | PRN

## 2013-11-28 MED ORDER — METRONIDAZOLE IN NACL 5-0.79 MG/ML-% IV SOLN
INTRAVENOUS | Status: AC
  Filled 2013-11-28: qty 100

## 2013-11-28 NOTE — Progress Notes (Signed)
Subjective: Patient is resting. Continued to complain of abdominal pain and diarrhea. No nausea or vomiting.  Objective: Vital signs in last 24 hours: Temp:  [97 F (36.1 C)] 97 F (36.1 C) (04/26 2157) Pulse Rate:  [59-61] 61 (04/26 2157) Resp:  [20] 20 (04/26 2157) BP: (96-99)/(59-66) 96/59 mmHg (04/26 2157) SpO2:  [97 %-100 %] 100 % (04/26 2157) Weight change:  Last BM Date: 11/26/13  Intake/Output from previous day: 04/26 0701 - 04/27 0700 In: 1640 [P.O.:240; I.V.:900; IV Piggyback:500] Out: -   PHYSICAL EXAM General appearance: alert and no distress Resp: clear to auscultation bilaterally Cardio: regular rate and rhythm, S1, S2 normal, no murmur, click, rub or gallop GI: soft, non-tender; bowel sounds normal; no masses,  no organomegaly Extremities: extremities normal, atraumatic, no cyanosis or edema  Lab Results:    @labtest @ [facility] No results found for this basename: PHART, PCO2, PO2ART, TCO2, HCO3,  in the last 72 hours CULTURES Recent Results (from the past 240 hour(s))  URINE CULTURE     Status: None   Collection Time    11/20/13 12:02 PM      Result Value Ref Range Status   Specimen Description URINE, CLEAN CATCH   Final   Special Requests NONE   Final   Culture  Setup Time     Final   Value: 11/20/2013 20:14     Performed at [facility]     Final   Value: NO GROWTH     Performed at [facility]   Culture     Final   Value: NO GROWTH     Performed at [facility]   Report Status 11/21/2013 FINAL   Final  CLOSTRIDIUM DIFFICILE BY PCR     Status: None   Collection Time    11/27/13  4:10 PM      Result Value Ref Range Status   C difficile by pcr NEGATIVE  NEGATIVE Final   Studies/Results: No results found.  Medications: I have reviewed the patient's current medications.  Assesment: Principal Problem:   Abdominal pain Active Problems:   Acute diarrhea s/p cholecystectomy chroni pain  syndrome   Plan: Medications reviewed Continue pain management  According to GI recommendation    LOS: 2 days   Leanord Thibeau 11/28/2013, 7:15 AM

## 2013-11-28 NOTE — Progress Notes (Signed)
Utilization Review Complete  

## 2013-11-28 NOTE — Progress Notes (Signed)
Subjective; Patient continues to complain of pain under the right rib cage radiating posteriorly. Patient was discharged on 11/25/2013 after undergoing stent placement for biliary leak antibiotic therapy for possible infected biloma. She was discharged on oxycodone 5 mg every 6 when necessary. Patient states her pain was not well controlled and she developed diarrhea. Also developed chills but no fever. Her appetite has been poor. She's been nauseated but has not vomited. She states she is getting pain relief with IV Dilaudid.  Objective; BP 99/63  Pulse 54  Temp(Src) 98.3 F (36.8 C) (Oral)  Resp 20  Ht 5\' 6"  (1.676 m)  Wt 145 lb (65.772 kg)  BMI 23.41 kg/m2  SpO2 98%  LMP 11/11/2013 Patient is in no acute distress. Abdomen is symmetrical with normal bowel sounds. Abdomen is soft with mild to moderate tenderness below the right costal margin but no guarding organomegaly or masses noted. No LE edema.  Lab data from this morning; WBC 7.9, H&H 9.6 and 29.2 and platelet count 409K. C. difficile by PCR is negative. LFTs from 11/27/2013. Bilirubin 0.2, AB 116, AST 9, ALT 9 and albumin 2.6  Assessment; Persistent right upper quadrant abdominal pain felt to be secondary to small infected biloma which is documented to be stable a followup CT prior to discharge last week. She does not have any stigmata to suggest reversal of course such as leukocytosis or fever. Will do followup CT to document continued reduction in size of this biloma but it is possible that she needs much higher dose of pain medication when she goes home as low-dose did not work. She would benefit from an evaluation by Dr. Merlene Laughter for pain management   Recommendations; Abdominal CT with contrast in a.m. Pain management consultation with Dr. Merlene Laughter as discussed with Dr. Legrand Rams.

## 2013-11-29 ENCOUNTER — Encounter (HOSPITAL_COMMUNITY): Payer: Self-pay | Admitting: Radiology

## 2013-11-29 ENCOUNTER — Inpatient Hospital Stay (HOSPITAL_COMMUNITY): Payer: Medicaid Other

## 2013-11-29 MED ORDER — IOHEXOL 300 MG/ML  SOLN
100.0000 mL | Freq: Once | INTRAMUSCULAR | Status: AC | PRN
  Administered 2013-11-29: 100 mL via INTRAVENOUS

## 2013-11-29 MED ORDER — CARISOPRODOL 350 MG PO TABS
350.0000 mg | ORAL_TABLET | Freq: Four times a day (QID) | ORAL | Status: DC
  Administered 2013-11-29 – 2013-11-30 (×5): 350 mg via ORAL
  Filled 2013-11-29 (×6): qty 1

## 2013-11-29 MED ORDER — IOHEXOL 300 MG/ML  SOLN
50.0000 mL | Freq: Once | INTRAMUSCULAR | Status: AC | PRN
  Administered 2013-11-29: 50 mL via ORAL

## 2013-11-29 MED ORDER — BUPRENORPHINE HCL 2 MG SL SUBL
2.0000 mg | SUBLINGUAL_TABLET | Freq: Three times a day (TID) | SUBLINGUAL | Status: DC | PRN
Start: 2013-11-29 — End: 2013-11-30
  Filled 2013-11-29: qty 1

## 2013-11-29 MED ORDER — GABAPENTIN 300 MG PO CAPS
600.0000 mg | ORAL_CAPSULE | Freq: Three times a day (TID) | ORAL | Status: DC
  Administered 2013-11-29 – 2013-11-30 (×5): 600 mg via ORAL
  Filled 2013-11-29 (×5): qty 2

## 2013-11-29 NOTE — Progress Notes (Addendum)
Subjective; Patient continues complaining of nausea and right upper quadrant abdominal pain. Diarrhea has improved. She had three bowel movements today. She is anxious to go home.  Objective; BP 96/65  Pulse 63  Temp(Src) 97.3 F (36.3 C) (Oral)  Resp 20  Ht 5\' 6"  (1.676 m)  Wt 145 lb (65.772 kg)  BMI 23.41 kg/m2  SpO2 99%  LMP 11/11/2013 Abdomen symmetrical with normal bowel sounds; it is soft with mild to moderate right subcostal tenderness. No organomegaly or masses.  Lab data; Abdominals CT viewed and compared with study from 11/23/2013; GB fossa fluid collection has decreased. Study also shared with patient and her mother who is at bedside. Biliary stent in place.  Assessment; Continued reduction in gallbladder fossa fluid collection. Nausea possibly due to metronidazole. Dr. Merlene Laughter assisting with pain management and patient is being transitionedfrom IV to by mouth medications.  Recommendations; Advance diet. Discontinue metronidazole.
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
## 2013-11-29 NOTE — Progress Notes (Signed)
I have gotten several calls from the day on the patient. However, nurse reports that she is resting well most of the time. The plan is to continue to hold all opioids for now. She is to continue with the Soma and gabapentin. We will reinstitute Suboxone as late as we can to avoid withdrawals.

## 2013-11-29 NOTE — Progress Notes (Signed)
Subjective: Patient is resting. Continue to complain of pain. No diarrhea, nausea or vomiting. Her leucocytosis improved. No fever. Clinically she has improved but continue to complain of pain and receiving Iv hydromorphone. She has history of chronic pain. Objective: Vital signs in last 24 hours: Temp:  [97.6 F (36.4 C)-98.3 F (36.8 C)] 97.6 F (36.4 C) (04/28 6256) Pulse Rate:  [54-60] 55 (04/28 0642) Resp:  [20] 20 (04/28 0642) BP: (88-102)/(60-68) 88/68 mmHg (04/28 0642) SpO2:  [97 %-100 %] 99 % (04/28 3893) Weight change:  Last BM Date: 11/28/13  Intake/Output from previous day: 04/27 0701 - 04/28 0700 In: 360 [P.O.:360] Out: -   PHYSICAL EXAM General appearance: alert and no distress Resp: clear to auscultation bilaterally Cardio: regular rate and rhythm, S1, S2 normal, no murmur, click, rub or gallop GI: soft, non-tender; bowel sounds normal; no masses,  no organomegaly Extremities: extremities normal, atraumatic, no cyanosis or edema  Lab Results:    @labtest @ [facility] No results found for this basename: PHART, PCO2, PO2ART, TCO2, HCO3,  in the last 72 hours CULTURES Recent Results (from the past 240 hour(s))  URINE CULTURE     Status: None   Collection Time    11/20/13 12:02 PM      Result Value Ref Range Status   Specimen Description URINE, CLEAN CATCH   Final   Special Requests NONE   Final   Culture  Setup Time     Final   Value: 11/20/2013 20:14     Performed at [facility]     Final   Value: NO GROWTH     Performed at [facility]   Culture     Final   Value: NO GROWTH     Performed at [facility]   Report Status 11/21/2013 FINAL   Final  CLOSTRIDIUM DIFFICILE BY PCR     Status: None   Collection Time    11/27/13  4:10 PM      Result Value Ref Range Status   C difficile by pcr NEGATIVE  NEGATIVE Final   Studies/Results: No results found.  Medications: I have reviewed the patient's current  medications.  Assesment: Principal Problem:   Abdominal pain Active Problems:   Acute diarrhea s/p cholecystectomy chroni pain syndrome   Plan: Medications reviewed According to GI recommendation Will do neurology consult for pain management.   LOS: 3 days   Sharnelle Cappelli 11/29/2013, 7:10 AM

## 2013-11-30 MED ORDER — GABAPENTIN 300 MG PO CAPS: 600.0000 mg | ORAL_CAPSULE | Freq: Three times a day (TID) | ORAL | Status: DC

## 2013-11-30 MED ORDER — CARISOPRODOL 350 MG PO TABS: 350.0000 mg | ORAL_TABLET | Freq: Four times a day (QID) | ORAL | Status: DC

## 2013-11-30 MED ORDER — CARISOPRODOL 350 MG PO TABS
350.0000 mg | ORAL_TABLET | Freq: Three times a day (TID) | ORAL | Status: DC
  Administered 2013-11-30: 350 mg via ORAL
  Filled 2013-11-30: qty 1

## 2013-11-30 MED ORDER — BUPRENORPHINE HCL-NALOXONE HCL 8-2 MG SL SUBL
1.0000 | SUBLINGUAL_TABLET | Freq: Three times a day (TID) | SUBLINGUAL | Status: DC | PRN
  Administered 2013-11-30: 1 via SUBLINGUAL
  Filled 2013-11-30: qty 1

## 2013-11-30 NOTE — Progress Notes (Signed)
Pt discharged home today per Dr. Legrand Rams. Pt's IV site D/C'd and WNL. Pt's VSS. Pt provided with home medication list, discharge instructions and prescriptions. Pt verbalized understanding. Pt also to pick up prescriptions from Dr. Freddie Apley office at discharge. Verbalized understanding. Pt left floor via WC in stable condition accompanied by NT.

## 2013-11-30 NOTE — Progress Notes (Signed)
Patient ID: Catherine Ramirez, female   DOB: 1986/01/08, 28 y.o.   MRN: [id number]  [place] A. Merlene Laughter, MD     [email or web address]          Catherine Ramirez is an 28 y.o. female.   Assessment/Plan: 1. Acute abdominal pain on chronic pelvic/abdominal pain.  2. Opioid tolerant. She has been on opioids for a long time at high doses and likely will require MEGA doses for pain relief. We will restart the patient on Suboxone. There is a risk that she may be thrown in withdrawal however. We will therefore wait as long as we can before restarting this. She will be given gabapentin to help with the symptoms in the meantime. We will continue with the gabapentin at 600 mg but reduce the Soma to 3 times a day dosing and probably an as-needed basis. She'll be started on Suboxone/Buprenorphine and titrated.    She reports that she is on fairly well with the medications for withdrawal symptoms. The Catherine Ramirez has worked well but it has caused a lot of drowsiness when she takes it. She still complains of having a lot of pain. Apparently the combination of medication that is the Soma and Neurontin will help with the withdrawal symptoms but not a whole lot with pain. Nursing staff reports that she has had less complaining overnight and has been sleeping. She reports her pain level as 8-1/2.   GENERAL: She is in no acute distress.  HEENT: Supple. Atraumatic normocephalic.  ABDOMEN: soft  EXTREMITIES: No edema  BACK: Normal.  SKIN: Normal by inspection.  MENTAL STATUS: Alert and oriented. Speech, language and cognition are generally intact. Judgment and insight normal.  CRANIAL NERVES: Pupils are equal, round and reactive to light and accommodation; extra ocular movements are full, there is no significant nystagmus; visual fields are full; upper and lower facial muscles are normal in strength and symmetric, there is no flattening of the nasolabial folds; tongue is midline; uvula is midline;  shoulder elevation is normal.  MOTOR: Normal tone, bulk and strength; no pronator drift.  COORDINATION: Left finger to nose is normal, right finger to nose is normal, No rest tremor; no intention tremor; no postural tremor; no bradykinesia.  REFLEXES: Deep tendon reflexes are symmetrical and normal.  SENSATION: Normal to light touch.       Objective: Vital signs in last 24 hours: Temp:  [97.3 F (36.3 C)-97.7 F (36.5 C)] 97.7 F (36.5 C) (04/29 0504) Pulse Rate:  [52-63] 52 (04/29 0504) Resp:  [20] 20 (04/29 0504) BP: (90-96)/(54-65) 90/54 mmHg (04/29 0504) SpO2:  [97 %-99 %] 99 % (04/29 0504)  Intake/Output from previous day: 04/28 0701 - 04/29 0700 In: 250 [I.V.:100; IV Piggyback:150] Out: -  Intake/Output this shift:   Nutritional status: General   Lab Results: No results found for this or any previous visit (from the past 48 hour(s)).  Lipid Panel No results found for this basename: CHOL, TRIG, HDL, CHOLHDL, VLDL, LDLCALC,  in the last 72 hours  Studies/Results: Ct Abdomen W Contrast  11/29/2013   CLINICAL DATA:  Persistent abdominal pain. Two weeks postop from cholecystectomy. Followup biloma.  EXAM: CT ABDOMEN WITH CONTRAST  TECHNIQUE: Multidetector CT imaging of the abdomen was performed using the standard protocol following bolus administration of intravenous contrast.  CONTRAST:  17mL OMNIPAQUE IOHEXOL 300 MG/ML  SOLN  COMPARISON:  11/23/2013  FINDINGS: A stent is again seen within the common bile duct and there is no evidence of biliary  ductal dilatation. Fluid collection in the gallbladder fossa shows further decreased in size, currently measuring 2.2 x 2.7 cm on image 22 compared to 2.9 x 3.9 cm previously. No other abnormal fluid collections are visualized within the abdomen.  The liver, pancreas, spleen, adrenal glands, and kidneys are normal in appearance. No evidence of hydronephrosis. No abdominal soft tissue masses or lymphadenopathy identified. No acute  inflammatory process or dilated bowel loops visualized.  IMPRESSION: Continued mild decrease in size of small fluid collection within the gallbladder fossa. Internal CBD stent remains in place. No acute findings.   Electronically Signed   By: Earle Gell M.D.   On: 11/29/2013 16:27    Medications:  Scheduled Meds: . carisoprodol  350 mg Oral QID  . colestipol  1 g Oral TID WC  . gabapentin  600 mg Oral TID  . levofloxacin (LEVAQUIN) IV  500 mg Intravenous Q24H   Continuous Infusions: . sodium chloride 10 mL/hr (11/29/13 0709)   PRN Meds:.acetaminophen, acetaminophen, albuterol, buprenorphine, diphenhydrAMINE, ondansetron (ZOFRAN) IV, ondansetron     LOS: 4 days   Vidhi Delellis A. Merlene Laughter, M.D.  Diplomate, Tax adviser of Psychiatry and Neurology ( Neurology).

## 2013-11-30 NOTE — Discharge Summary (Signed)
Physician Discharge Summary  Patient ID: Catherine Ramirez MRN: [id number] DOB/AGE: April 19, 1986 28 y.o. Primary Care Physician:Dominica Kent, MD Admit date: 11/26/2013 Discharge date: 11/30/2013    Discharge Diagnoses:   Principal Problem:   Abdominal pain Active Problems:   Acute diarrhea     Medication List    STOP taking these medications       metroNIDAZOLE 500 MG tablet  Commonly known as:  FLAGYL     oxyCODONE 5 MG immediate release tablet  Commonly known as:  Oxy IR/ROXICODONE      TAKE these medications       Biotin 1000 MCG tablet  Take 1,000 mcg by mouth daily.     carisoprodol 350 MG tablet  Commonly known as:  SOMA  Take 1 tablet (350 mg total) by mouth 4 (four) times daily.     DSS 100 MG Caps  Take 100 mg by mouth 2 (two) times daily.     EPIPEN 0.3 mg/0.3 mL Devi  Generic drug:  EPINEPHrine  Inject 0.3 mg into the muscle once.     gabapentin 300 MG capsule  Commonly known as:  NEURONTIN  Take 2 capsules (600 mg total) by mouth 3 (three) times daily.     levofloxacin 750 MG tablet  Commonly known as:  LEVAQUIN  Take 1 tablet (750 mg total) by mouth daily.     Linaclotide 145 MCG Caps capsule  Commonly known as:  LINZESS  Take 1 capsule (145 mcg total) by mouth daily.     pantoprazole 40 MG tablet  Commonly known as:  PROTONIX  Take 1 tablet (40 mg total) by mouth at bedtime.     polyethylene glycol packet  Commonly known as:  MIRALAX / GLYCOLAX  Take 17 g by mouth daily.        Discharged Condition: improved    Consults: GI and neurology  Significant Diagnostic Studies: Ct Abdomen Pelvis Wo Contrast  11/20/2013   CLINICAL DATA:  Right upper quadrant abdominal pain. Prior history of biliary leak.  EXAM: CT ABDOMEN AND PELVIS WITHOUT CONTRAST  TECHNIQUE: Multidetector CT imaging of the abdomen and pelvis was performed following the standard protocol without intravenous contrast.  COMPARISON:  CT of the abdomen and pelvis 10/08/2013.   FINDINGS: Lung Bases: Unremarkable.  Abdomen/Pelvis: Status post cholecystectomy. Biliary stent extends from the level of the ampulla into the hepatic hilum in the common bile duct. No description in the gallbladder fossa there is a well-defined 4.0 x 2.7 cm collection the of low to intermediate attenuation material, likely to represent a small biloma. The appearance of the pancreas, spleen, bilateral adrenal glands and bilateral kidneys is unremarkable. Small splenule medial to the spleen, similar to prior examinations.  No significant volume of ascites. No pneumoperitoneum. No pathologic distention of small bowel. No definite lymphadenopathy identified within the abdomen or pelvis. Uterus and ovaries are unremarkable in appearance. Surgical clips in the left adnexal region. Urinary bladder is unremarkable in appearance.  Musculoskeletal: Sclerosis in the anterior aspect of the ilium bilaterally, likely related to prior external fixation device. ORIF in the right ilium adjacent to the sacroiliac joint.  IMPRESSION: 1. Status post cholecystectomy with small 4.0 x 2.7 cm low to intermediate attenuation collection in the gallbladder fossa, presumably a small postoperative biloma. This corresponds to the area of suspected biliary leak on recent nuclear medicine hepatobiliary scan dated 11/08/2013. 2. Common bile duct stent in position, as above. 3. Additional incidental findings, as above, similar prior studies.  Electronically Signed   By: Vinnie Langton M.D.   On: 11/20/2013 15:25   Ct Abdomen W Contrast  11/29/2013   CLINICAL DATA:  Persistent abdominal pain. Two weeks postop from cholecystectomy. Followup biloma.  EXAM: CT ABDOMEN WITH CONTRAST  TECHNIQUE: Multidetector CT imaging of the abdomen was performed using the standard protocol following bolus administration of intravenous contrast.  CONTRAST:  179mL OMNIPAQUE IOHEXOL 300 MG/ML  SOLN  COMPARISON:  11/23/2013  FINDINGS: A stent is again seen within  the common bile duct and there is no evidence of biliary ductal dilatation. Fluid collection in the gallbladder fossa shows further decreased in size, currently measuring 2.2 x 2.7 cm on image 22 compared to 2.9 x 3.9 cm previously. No other abnormal fluid collections are visualized within the abdomen.  The liver, pancreas, spleen, adrenal glands, and kidneys are normal in appearance. No evidence of hydronephrosis. No abdominal soft tissue masses or lymphadenopathy identified. No acute inflammatory process or dilated bowel loops visualized.  IMPRESSION: Continued mild decrease in size of small fluid collection within the gallbladder fossa. Internal CBD stent remains in place. No acute findings.   Electronically Signed   By: Earle Gell M.D.   On: 11/29/2013 16:27   Ct Abdomen W Contrast  11/23/2013   CLINICAL DATA:  Bile leak following cholecystectomy.  EXAM: CT ABDOMEN WITH CONTRAST  TECHNIQUE: Multidetector CT imaging of the abdomen was performed using the standard protocol following bolus administration of intravenous contrast.  CONTRAST:  59mL OMNIPAQUE IOHEXOL 300 MG/ML SOLN, 152mL OMNIPAQUE IOHEXOL 300 MG/ML SOLN  COMPARISON:  CT 11/20/2013, ERCP 11/18/2013, HIDA scan 11/18/2013  FINDINGS: Lung bases are clear.  No pericardial fluid.  No focal hepatic lesion. There is a fluid collection within the gallbladder fossa measuring 38 x 29 mm compared to 40 x 27 mm on prior for minimal contraction. There is a stent within the common bile duct. There is no biliary duct dilatation. No fluid along the pericolic gutter proximally.  The pancreas is normal without evidence of inflammation. The spleen, adrenal glands, kidneys normal.  The stomach limited view of the bowel are unremarkable.  IMPRESSION: 1. Slight contraction of the small biloma within the gallbladder fossa. 2. No evidence of active bile leak by CT. 3. Biliary stent appears in good position with without evidence of intrahepatic biliary duct dilatation. 4.  The pancreas is normal.   Electronically Signed   By: Suzy Bouchard M.D.   On: 11/23/2013 17:32   Nm Hepatobiliary Liver Func  11/18/2013   CLINICAL DATA:  Right upper quadrant pain. Cholecystectomy 11/10/2013.  EXAM: NUCLEAR MEDICINE HEPATOBILIARY IMAGING  TECHNIQUE: Sequential images of the abdomen were obtained out to 60 minutes following intravenous administration of radiopharmaceutical.  RADIOPHARMACEUTICALS:  5.0 mCi mCi Tc-77m Choletec  COMPARISON:  No comparisons  FINDINGS: There is prompt uptake and excretion of radiotracer by the liver. There is radiotracer accumulation in the region of the gallbladder fossa which is later noted to spread in the abdomen. Findings are compatible with a biliary leak in the gallbladder fossa.  IMPRESSION: Biliary leak in the gallbladder fossa.  These results were called by telephone at the time of interpretation on 11/18/2013 at 6:50 PM to Dr. Anastasio Champion who verbally acknowledged these results.   Electronically Signed   By: Rolm Baptise M.D.   On: 11/18/2013 18:58   Dg Chest Port 1 View  11/23/2013   CLINICAL DATA:  Status post PICC line placement  EXAM: PORTABLE CHEST - 1 VIEW  COMPARISON:  11/18/2013  FINDINGS: Cardiac shadow is within normal limits. The lungs are again clear. No bony abnormality is seen. A new right-sided PICC line is noted with the tip in the right atrium superiorly. This should be withdrawn approximately 2-2.5 cm. No other focal abnormality is noted.  IMPRESSION: PICC line positioning as described. This should be withdrawn as described.   Electronically Signed   By: Inez Catalina M.D.   On: 11/23/2013 15:05   Dg Ercp With Sphincterotomy  11/19/2013   CLINICAL DATA:  CBD is stone.  EXAM: ERCP  TECHNIQUE: Multiple spot images obtained with the fluoroscopic device and submitted for interpretation post-procedure.  COMPARISON:  US ABDOMEN COMPLETE dated 10/16/2013; CT ABD - PELV W/ CM dated 10/08/2013; NM HEPATOBILIARY INCLUDE GB dated 11/18/2013   FINDINGS: Multiple images are submitted from ERCP. Opacification of the common bile duct demonstrates normal caliber system. There are least 2 areas of contrast leakage into the gallbladder fossa from right hepatic ducts. Final image demonstrates placement of biliary stent.  IMPRESSION: At least 2 small right hepatic ducts demonstrate leakage into the region of the gallbladder fossa.  These images were submitted for radiologic interpretation only. Please see the procedural report for the amount of contrast and the fluoroscopy time utilized.   Electronically Signed   By: Rolm Baptise M.D.   On: 11/19/2013 00:15   Dg Abd Acute W/chest  11/18/2013   CLINICAL DATA:  Abdomen pain  EXAM: ACUTE ABDOMEN SERIES (ABDOMEN 2 VIEW & CHEST 1 VIEW)  COMPARISON:  None.  FINDINGS: There is no evidence of dilated bowel loops or free intraperitoneal air. Bowel content is noted throughout colon to No radiopaque calculi or other significant radiographic abnormality is seen. Patient status post prior fixation of the right pelvic bone. Prior cholecystectomy clips are noted. Heart size and mediastinal contours are within normal limits. Both lungs are clear.  IMPRESSION: Negative abdominal radiographs. Constipation. No acute cardiopulmonary disease.   Electronically Signed   By: Abelardo Diesel M.D.   On: 11/18/2013 15:58   US Abdomen Limited Ruq  11/18/2013   CLINICAL DATA:  Elevated liver function tests. Recent cholecystectomy with continued right upper quadrant pain.  EXAM: US ABDOMEN LIMITED - RIGHT UPPER QUADRANT  COMPARISON:  US ABDOMEN LIMITED RUQ/ASCITES dated 11/14/2013; CT ABD - PELV W/ CM dated 10/08/2013  FINDINGS: Gallbladder:  Surgically absent with small amount of fluid in the gallbladder fossa.  Common bile duct:  Diameter: 5 mm.  No visualized focal filling defect or cut off.  Liver:  Minimal central intrahepatic ductal prominence.  IMPRESSION: Presumed postsurgical evolution of gallbladder fossa appearance.  No common  duct dilatation. Mild prominence of the central intrahepatic ducts is noted. MRCP with contrast could be helpful for further evaluation if symptoms continue.  These results were called by telephone at the time of interpretation on 11/18/2013 at 3:21 PM to Dr. Francine Graven , who verbally acknowledged these results.   Electronically Signed   By: Conchita Paris M.D.   On: 11/18/2013 15:21   US Abdomen Limited Ruq  11/14/2013   CLINICAL DATA:  4 days s/p lap. gall bladder  EXAM: US ABDOMEN LIMITED - RIGHT UPPER QUADRANT  COMPARISON:  None.  FINDINGS: Gallbladder:  Status post cholecystectomy.  Gallbladder fossa unremarkable.  Common bile duct:  Diameter: 5.3 mm  Liver:  No focal lesion identified. Within normal limits in parenchymal echogenicity.  IMPRESSION: Negative right upper quadrant ultrasound. Gallbladder fossa is unremarkable.   Electronically Signed  By: Margaree Mackintosh M.D.   On: 11/14/2013 14:10   US Abdomen Limited Ruq  11/08/2013   CLINICAL DATA:  Gallstones  EXAM: US ABDOMEN LIMITED - RIGHT UPPER QUADRANT  COMPARISON:  US ABDOMEN COMPLETE dated 10/16/2013  FINDINGS: Gallbladder:  Multiple mobile gallstones are appreciated within the gallbladder. There is no gallbladder wall thickening, measuring 2.7 mm. No pericholecystic fluid nor a sonographic Murphy's sign.  Common bile duct:  Diameter: 3.8  Liver:  No focal lesion identified. Within normal limits in parenchymal echogenicity.  IMPRESSION: Cholelithiasis without evidence of cholecystitis.   Electronically Signed   By: Margaree Mackintosh M.D.   On: 11/08/2013 13:36    Lab Results: Basic Metabolic Panel:  Recent Labs  11/28/13 0545  NA 141  K 3.6*  CL 104  CO2 27  GLUCOSE 93  BUN 8  CREATININE 0.71  CALCIUM 8.6   Liver Function Tests: No results found for this basename: AST, ALT, ALKPHOS, BILITOT, PROT, ALBUMIN,  in the last 72 hours   CBC:  Recent Labs  11/28/13 0545  WBC 7.9  HGB 9.6*  HCT 29.2*  MCV 90.7  PLT 409*     Recent Results (from the past 240 hour(s))  URINE CULTURE     Status: None   Collection Time    11/20/13 12:02 PM      Result Value Ref Range Status   Specimen Description URINE, CLEAN CATCH   Final   Special Requests NONE   Final   Culture  Setup Time     Final   Value: 11/20/2013 20:14     Performed at [facility]     Final   Value: NO GROWTH     Performed at [facility]   Culture     Final   Value: NO GROWTH     Performed at [facility]   Report Status 11/21/2013 FINAL   Final  CLOSTRIDIUM DIFFICILE BY PCR     Status: None   Collection Time    11/27/13  4:10 PM      Result Value Ref Range Status   C difficile by pcr NEGATIVE  NEGATIVE Final     Hospital Course:   This is a 28 years old female who had post cholestectomy  Bile leak and stent placement was readmitted due abdominal pain and diarrhea. Repeat Ct scan showed improvement on the size of fluid collection. Patient was seen by GI and neurology. Her pain issue will be managed by her neurologist who has been seeing her for chronic pain.  Discharge Exam: Blood pressure 90/54, pulse 52, temperature 97.7 F (36.5 C), temperature source Oral, resp. rate 20, height 5\' 6"  (1.676 m), weight 65.772 kg (145 lb), last menstrual period 11/11/2013, SpO2 99.00%.   Disposition:  Home.      Signed: [name]   11/30/2013, 8:32 AM

## 2014-01-05 ENCOUNTER — Encounter (INDEPENDENT_AMBULATORY_CARE_PROVIDER_SITE_OTHER): Payer: Self-pay | Admitting: *Deleted

## 2014-01-10 ENCOUNTER — Telehealth (INDEPENDENT_AMBULATORY_CARE_PROVIDER_SITE_OTHER): Payer: Self-pay | Admitting: *Deleted

## 2014-01-10 NOTE — Telephone Encounter (Signed)
Left message asking patient to call me so I can sch'd ERCP

## 2014-01-10 NOTE — Telephone Encounter (Signed)
She will not need Spyglass; May need sphincterotomy if she has common duct stone. Will need CBC and chemistry panel. Will need antimicrobial prophylaxis.

## 2014-01-10 NOTE — Telephone Encounter (Signed)
Patient is on recall for ERCP with stent removal --  Will you do spyglass, sphincterotomy or stone removal -- also will she need labs -- please advise

## 2014-01-11 ENCOUNTER — Other Ambulatory Visit (INDEPENDENT_AMBULATORY_CARE_PROVIDER_SITE_OTHER): Payer: Self-pay | Admitting: *Deleted

## 2014-01-11 DIAGNOSIS — Z4689 Encounter for fitting and adjustment of other specified devices: Secondary | ICD-10-CM

## 2014-01-11 DIAGNOSIS — Z9889 Other specified postprocedural states: Secondary | ICD-10-CM

## 2014-01-11 NOTE — Telephone Encounter (Signed)
ERCP sch'd 01/13/14 @ 730, pre-op 6/11 @ 10.00, patient aware

## 2014-01-11 NOTE — Patient Instructions (Signed)
Catherine Ramirez  01/11/2014   Your procedure is scheduled on:  01/13/2014  Report to [facility] at  23  AM.  Call this number if you have problems the morning of surgery: [phone]   Remember:   Do not eat food or drink liquids after midnight.   Take these medicines the morning of surgery with A SIP OF WATER:  Soma, neurontin,protonix   Do not wear jewelry, make-up or nail polish.  Do not wear lotions, powders, or perfumes.   Do not shave 48 hours prior to surgery. Men may shave face and neck.  Do not bring valuables to the hospital.  [facility] is not responsible for any belongings or valuables.               Contacts, dentures or bridgework may not be worn into surgery.  Leave suitcase in the car. After surgery it may be brought to your room.  For patients admitted to the hospital, discharge time is determined by your treatment team.               Patients discharged the day of surgery will not be allowed to drive home.  Name and phone number of your driver: family  Special Instructions: N/A   Please read over the following fact sheets that you were given: Pain Booklet, Coughing and Deep Breathing, Surgical Site Infection Prevention, Anesthesia Post-op Instructions and Care and Recovery After Surgery Endoscopic Retrograde Cholangiopancreatography (ERCP) Endoscopic retrograde cholangiopancreatography (ERCP) is a procedure used to diagnosis many diseases of the pancreas, bile ducts, liver, and gallbladder. During ERCP a thin, lighted tube (endoscope) is passed through the mouth and down the back of the throat into the first part of the small intestine (duodenum). A small, plastic tube (cannula) is then passed through the endoscope and directed into the bile duct or pancreatic duct. Dye is then injected through the cannula and X-rays are taken to study the biliary and pancreatic passageways.  LET [facility] CARE PROVIDER KNOW ABOUT:   Any allergies you have.   All  medicines you are taking, including vitamins, herbs, eyedrops, creams, and over-the-counter medicines.   Previous problems you or members of your family have had with the use of anesthetics.   Any blood disorders you have.   Previous surgeries you have had.   Medical conditions you have. RISKS AND COMPLICATIONS Generally, ERCP is a safe procedure. However, as with any procedure, complications can occur. A simple removal of gallstones has the lowest rate of complications. Higher rates of complication occur in people who have poorly functioning bile or pancreatic ducts. Possible complications include:   Pancreatitis.  Bleeding.  Accidental punctures in the bowel wall, pancreas, or gall bladder.  Gall bladder or bile duct infection. BEFORE THE PROCEDURE   Do not eat or drink anything, including water, for at least 8 hours before the procedure or as directed by your health care provider.   Ask your health care provider whether you should stop taking certain medicines prior to your procedure.   Arrange for someone to drive you home. You will not be allowed to drive for 12 24 hours after the procedure. PROCEDURE   You will be given medicine through a vein (intravenously) to make you relaxed and sleepy.   You might have a breathing tube placed to give you medicine that makes you sleep (general anesthetic).   Your throat may be sprayed with medicine that numbs the area and prevents  gagging (local anesthetic), or you may gargle this medicine.   You will lie on your left side.   The endoscope will be inserted through your mouth and into the duodenum. The tube will not interfere with your breathing. Gagging is prevented by the anesthesia.   While X-rays are being taken, you may be positioned on your stomach.   A small sample of tissue (biopsy) may be removed for examination. AFTER THE PROCEDURE   You will rest in bed until you are fully conscious.   When you first wake  up, your throat may feel slightly sore.   You will not be allowed to eat or drink until numbness subsides.   Once you are able to drink, urinate, and sit on the edge of the bed without feeling sick to your stomach (nauseous) or dizzy, you may be allowed to go home. Document Released: 04/15/2001 Document Revised: 05/11/2013 Document Reviewed: 03/01/2013 [facility] Patient Information 2014 [place], [place]. PATIENT INSTRUCTIONS POST-ANESTHESIA  IMMEDIATELY FOLLOWING SURGERY:  Do not drive or operate machinery for the first twenty four hours after surgery.  Do not make any important decisions for twenty four hours after surgery or while taking narcotic pain medications or sedatives.  If you develop intractable nausea and vomiting or a severe headache please notify your doctor immediately.  FOLLOW-UP:  Please make an appointment with your surgeon as instructed. You do not need to follow up with anesthesia unless specifically instructed to do so.  WOUND CARE INSTRUCTIONS (if applicable):  Keep a dry clean dressing on the anesthesia/puncture wound site if there is drainage.  Once the wound has quit draining you may leave it open to air.  Generally you should leave the bandage intact for twenty four hours unless there is drainage.  If the epidural site drains for more than 36-48 hours please call the anesthesia department.  QUESTIONS?:  Please feel free to call your physician or the hospital operator if you have any questions, and they will be happy to assist you.

## 2014-01-12 ENCOUNTER — Encounter (HOSPITAL_COMMUNITY)
Admission: RE | Admit: 2014-01-12 | Discharge: 2014-01-12 | Disposition: A | Discharge status: Home / Self Care | Payer: Medicaid Other | Source: Ambulatory Visit | Attending: Internal Medicine | Admitting: Internal Medicine

## 2014-01-12 ENCOUNTER — Encounter (HOSPITAL_COMMUNITY): Payer: Self-pay

## 2014-01-12 VITALS — BP 97/59 | HR 57 | Temp 97.6°F | Resp 18 | Ht 65.0 in | Wt 129.0 lb

## 2014-01-12 DIAGNOSIS — Z9889 Other specified postprocedural states: Secondary | ICD-10-CM

## 2014-01-12 DIAGNOSIS — Z4689 Encounter for fitting and adjustment of other specified devices: Secondary | ICD-10-CM

## 2014-01-12 LAB — COMPREHENSIVE METABOLIC PANEL
ALT: 6 U/L (ref 0–35)
AST: 12 U/L (ref 0–37)
Albumin: 3.3 g/dL — ABNORMAL LOW (ref 3.5–5.2)
Alkaline Phosphatase: 79 U/L (ref 39–117)
BUN: 17 mg/dL (ref 6–23)
CALCIUM: 9.1 mg/dL (ref 8.4–10.5)
CHLORIDE: 100 meq/L (ref 96–112)
CO2: 28 mEq/L (ref 19–32)
Creatinine, Ser: 0.57 mg/dL (ref 0.50–1.10)
GFR calc Af Amer: >90 mL/min (ref >90)
GFR calc non Af Amer: >90 mL/min (ref >90)
Glucose, Bld: 94 mg/dL (ref 70–99)
Potassium: 4.1 mEq/L (ref 3.7–5.3)
Sodium: 140 mEq/L (ref 137–147)
Total Bilirubin: 0.2 mg/dL — ABNORMAL LOW (ref 0.3–1.2)
Total Protein: 7.7 g/dL (ref 6.0–8.3)

## 2014-01-12 LAB — CBC WITH DIFFERENTIAL/PLATELET
BASOS ABS: 0 10*3/uL (ref 0.0–0.1)
Basophils Relative: 0 % (ref 0–1)
EOS PCT: 1 % (ref 0–5)
Eosinophils Absolute: 0.1 10*3/uL (ref 0.0–0.7)
HCT: 33.3 % — ABNORMAL LOW (ref 36.0–46.0)
Hemoglobin: 11 g/dL — ABNORMAL LOW (ref 12.0–15.0)
LYMPHS PCT: 24 % (ref 12–46)
Lymphs Abs: 2.4 10*3/uL (ref 0.7–4.0)
MCH: 29.2 pg (ref 26.0–34.0)
MCHC: 33 g/dL (ref 30.0–36.0)
MCV: 88.3 fL (ref 78.0–100.0)
Monocytes Absolute: 0.5 10*3/uL (ref 0.1–1.0)
Monocytes Relative: 5 % (ref 3–12)
NEUTROS ABS: 7 10*3/uL (ref 1.7–7.7)
NEUTROS PCT: 70 % (ref 43–77)
Platelets: 477 10*3/uL — ABNORMAL HIGH (ref 150–400)
RBC: 3.77 MIL/uL — ABNORMAL LOW (ref 3.87–5.11)
RDW: 14.9 % (ref 11.5–15.5)
WBC: 10.1 10*3/uL (ref 4.0–10.5)

## 2014-01-12 LAB — HCG, SERUM, QUALITATIVE: PREG SERUM: NEGATIVE

## 2014-01-13 ENCOUNTER — Ambulatory Visit (HOSPITAL_COMMUNITY): Payer: Medicaid Other

## 2014-01-13 ENCOUNTER — Ambulatory Visit (HOSPITAL_COMMUNITY): Payer: Medicaid Other | Admitting: Anesthesiology

## 2014-01-13 ENCOUNTER — Telehealth (INDEPENDENT_AMBULATORY_CARE_PROVIDER_SITE_OTHER): Payer: Self-pay | Admitting: *Deleted

## 2014-01-13 ENCOUNTER — Encounter (HOSPITAL_COMMUNITY): Payer: Medicaid Other | Admitting: Anesthesiology

## 2014-01-13 ENCOUNTER — Encounter (HOSPITAL_COMMUNITY): Payer: Self-pay | Admitting: *Deleted

## 2014-01-13 ENCOUNTER — Ambulatory Visit (HOSPITAL_COMMUNITY)
Admission: RE | Admit: 2014-01-13 | Discharge: 2014-01-13 | Disposition: A | Discharge status: Home / Self Care | Payer: Medicaid Other | Source: Ambulatory Visit | Attending: Internal Medicine | Admitting: Internal Medicine

## 2014-01-13 ENCOUNTER — Encounter (HOSPITAL_COMMUNITY)
Admission: RE | Disposition: A | Discharge status: Home / Self Care | Payer: Self-pay | Source: Ambulatory Visit | Attending: Internal Medicine

## 2014-01-13 DIAGNOSIS — Z4682 Encounter for fitting and adjustment of non-vascular catheter: Secondary | ICD-10-CM | POA: Insufficient documentation

## 2014-01-13 DIAGNOSIS — Z79899 Other long term (current) drug therapy: Secondary | ICD-10-CM | POA: Insufficient documentation

## 2014-01-13 DIAGNOSIS — Z9889 Other specified postprocedural states: Secondary | ICD-10-CM

## 2014-01-13 DIAGNOSIS — F411 Generalized anxiety disorder: Secondary | ICD-10-CM | POA: Insufficient documentation

## 2014-01-13 DIAGNOSIS — Z883 Allergy status to other anti-infective agents status: Secondary | ICD-10-CM | POA: Insufficient documentation

## 2014-01-13 DIAGNOSIS — R7989 Other specified abnormal findings of blood chemistry: Secondary | ICD-10-CM

## 2014-01-13 DIAGNOSIS — Z91038 Other insect allergy status: Secondary | ICD-10-CM | POA: Insufficient documentation

## 2014-01-13 DIAGNOSIS — K5900 Constipation, unspecified: Secondary | ICD-10-CM | POA: Insufficient documentation

## 2014-01-13 DIAGNOSIS — N949 Unspecified condition associated with female genital organs and menstrual cycle: Secondary | ICD-10-CM | POA: Insufficient documentation

## 2014-01-13 DIAGNOSIS — Z880 Allergy status to penicillin: Secondary | ICD-10-CM | POA: Insufficient documentation

## 2014-01-13 DIAGNOSIS — K589 Irritable bowel syndrome without diarrhea: Secondary | ICD-10-CM | POA: Insufficient documentation

## 2014-01-13 DIAGNOSIS — Z91013 Allergy to seafood: Secondary | ICD-10-CM | POA: Insufficient documentation

## 2014-01-13 DIAGNOSIS — G894 Chronic pain syndrome: Secondary | ICD-10-CM | POA: Insufficient documentation

## 2014-01-13 DIAGNOSIS — Z4689 Encounter for fitting and adjustment of other specified devices: Secondary | ICD-10-CM

## 2014-01-13 DIAGNOSIS — F17200 Nicotine dependence, unspecified, uncomplicated: Secondary | ICD-10-CM | POA: Insufficient documentation

## 2014-01-13 HISTORY — PX: STENT REMOVAL: SHX6421

## 2014-01-13 HISTORY — PX: ERCP: SHX5425

## 2014-01-13 HISTORY — PX: SPHINCTEROTOMY: SHX5544

## 2014-01-13 SURGERY — ERCP, WITH INTERVENTION IF INDICATED
Anesthesia: General | Site: Esophagus

## 2014-01-13 MED ORDER — FENTANYL CITRATE 0.05 MG/ML IJ SOLN
25.0000 ug | INTRAMUSCULAR | Status: AC
  Administered 2014-01-13: 25 ug via INTRAVENOUS

## 2014-01-13 MED ORDER — FENTANYL CITRATE 0.05 MG/ML IJ SOLN
INTRAMUSCULAR | Status: AC
  Filled 2014-01-13: qty 2

## 2014-01-13 MED ORDER — ROCURONIUM BROMIDE 100 MG/10ML IV SOLN
INTRAVENOUS | Status: DC | PRN
  Administered 2014-01-13: 5 mg via INTRAVENOUS

## 2014-01-13 MED ORDER — MIDAZOLAM HCL 2 MG/2ML IJ SOLN
INTRAMUSCULAR | Status: AC
  Filled 2014-01-13: qty 2

## 2014-01-13 MED ORDER — FENTANYL CITRATE 0.05 MG/ML IJ SOLN
INTRAMUSCULAR | Status: DC | PRN
  Administered 2014-01-13: 25 ug via INTRAVENOUS
  Administered 2014-01-13: 100 ug via INTRAVENOUS

## 2014-01-13 MED ORDER — DIPHENHYDRAMINE HCL 50 MG/ML IJ SOLN
25.0000 mg | Freq: Once | INTRAMUSCULAR | Status: AC
  Administered 2014-01-13: 25 mg via INTRAVENOUS

## 2014-01-13 MED ORDER — ONDANSETRON HCL 4 MG/2ML IJ SOLN: 4.0000 mg | Freq: Once | INTRAMUSCULAR | Status: DC | PRN

## 2014-01-13 MED ORDER — LEVOFLOXACIN IN D5W 500 MG/100ML IV SOLN
INTRAVENOUS | Status: AC
Start: 2014-01-13 — End: 2014-01-13
  Filled 2014-01-13: qty 100

## 2014-01-13 MED ORDER — SUCCINYLCHOLINE CHLORIDE 20 MG/ML IJ SOLN
INTRAMUSCULAR | Status: DC | PRN
  Administered 2014-01-13: 140 mg via INTRAVENOUS

## 2014-01-13 MED ORDER — MIDAZOLAM HCL 2 MG/2ML IJ SOLN
1.0000 mg | INTRAMUSCULAR | Status: DC | PRN
  Administered 2014-01-13 (×2): 2 mg via INTRAVENOUS

## 2014-01-13 MED ORDER — ROCURONIUM BROMIDE 50 MG/5ML IV SOLN
INTRAVENOUS | Status: AC
  Filled 2014-01-13: qty 1

## 2014-01-13 MED ORDER — LACTATED RINGERS IV SOLN
INTRAVENOUS | Status: DC
  Administered 2014-01-13: 1000 mL via INTRAVENOUS

## 2014-01-13 MED ORDER — STERILE WATER FOR IRRIGATION IR SOLN
Status: DC | PRN
  Administered 2014-01-13: 08:00:00

## 2014-01-13 MED ORDER — DIPHENHYDRAMINE HCL 50 MG/ML IJ SOLN
INTRAMUSCULAR | Status: AC
  Filled 2014-01-13: qty 1

## 2014-01-13 MED ORDER — EPINEPHRINE HCL 1 MG/ML IJ SOLN
INTRAMUSCULAR | Status: AC
  Filled 2014-01-13: qty 7

## 2014-01-13 MED ORDER — SUCCINYLCHOLINE CHLORIDE 20 MG/ML IJ SOLN
INTRAMUSCULAR | Status: AC
  Filled 2014-01-13: qty 1

## 2014-01-13 MED ORDER — DEXAMETHASONE SODIUM PHOSPHATE 4 MG/ML IJ SOLN
INTRAMUSCULAR | Status: AC
  Filled 2014-01-13: qty 1

## 2014-01-13 MED ORDER — IOHEXOL 350 MG/ML SOLN
INTRAVENOUS | Status: DC | PRN
  Administered 2014-01-13: 08:00:00

## 2014-01-13 MED ORDER — PROPOFOL 10 MG/ML IV BOLUS
INTRAVENOUS | Status: DC | PRN
  Administered 2014-01-13: 40 mg via INTRAVENOUS
  Administered 2014-01-13: 200 mg via INTRAVENOUS
  Administered 2014-01-13: 40 mg via INTRAVENOUS

## 2014-01-13 MED ORDER — PROPOFOL 10 MG/ML IV EMUL
INTRAVENOUS | Status: AC
  Filled 2014-01-13: qty 20

## 2014-01-13 MED ORDER — GLYCOPYRROLATE 0.2 MG/ML IJ SOLN
INTRAMUSCULAR | Status: AC
  Filled 2014-01-13: qty 1

## 2014-01-13 MED ORDER — ONDANSETRON HCL 4 MG/2ML IJ SOLN
4.0000 mg | Freq: Once | INTRAMUSCULAR | Status: AC
  Administered 2014-01-13: 4 mg via INTRAVENOUS

## 2014-01-13 MED ORDER — LIDOCAINE HCL (PF) 1 % IJ SOLN
INTRAMUSCULAR | Status: AC
  Filled 2014-01-13: qty 5

## 2014-01-13 MED ORDER — LEVOFLOXACIN IN D5W 500 MG/100ML IV SOLN: 500.0000 mg | Freq: Once | INTRAVENOUS | Status: DC

## 2014-01-13 MED ORDER — LIDOCAINE HCL (CARDIAC) 10 MG/ML IV SOLN
INTRAVENOUS | Status: DC | PRN
  Administered 2014-01-13: 30 mg via INTRAVENOUS

## 2014-01-13 MED ORDER — GLYCOPYRROLATE 0.2 MG/ML IJ SOLN
0.2000 mg | Freq: Once | INTRAMUSCULAR | Status: AC
  Administered 2014-01-13: 0.2 mg via INTRAVENOUS

## 2014-01-13 MED ORDER — SODIUM CHLORIDE 0.9 % IV SOLN
INTRAVENOUS | Status: AC
  Filled 2014-01-13: qty 50

## 2014-01-13 MED ORDER — GLUCAGON HCL RDNA (DIAGNOSTIC) 1 MG IJ SOLR
INTRAMUSCULAR | Status: AC
  Administered 2014-01-13 (×2): 0.25 mg via INTRAVENOUS
  Filled 2014-01-13: qty 2

## 2014-01-13 MED ORDER — DEXAMETHASONE SODIUM PHOSPHATE 4 MG/ML IJ SOLN
4.0000 mg | Freq: Once | INTRAMUSCULAR | Status: AC
  Administered 2014-01-13: 4 mg via INTRAVENOUS

## 2014-01-13 MED ORDER — ONDANSETRON HCL 4 MG/2ML IJ SOLN
INTRAMUSCULAR | Status: AC
  Filled 2014-01-13: qty 2

## 2014-01-13 MED ORDER — FENTANYL CITRATE 0.05 MG/ML IJ SOLN: 25.0000 ug | INTRAMUSCULAR | Status: DC | PRN

## 2014-01-13 SURGICAL SUPPLY — 16 items
BAG HAMPER (MISCELLANEOUS) ×3 IMPLANT
BALLN RETRIEVAL 9-12 (BALLOONS) ×3 IMPLANT
BALN RTRVL 6-7FR 9-12 BLW INJ (BALLOONS) ×1
DEVICE LOCKING W-BIOPSY CAP (MISCELLANEOUS) ×3 IMPLANT
KIT CLEAN ENDO COMPLIANCE (KITS) ×3 IMPLANT
KIT ROOM TURNOVER APOR (KITS) ×3 IMPLANT
LUBRICANT JELLY 4.5OZ STERILE (MISCELLANEOUS) ×3 IMPLANT
MANIFOLD NEPTUNE II (INSTRUMENTS) ×3 IMPLANT
PAD ARMBOARD 7.5X6 YLW CONV (MISCELLANEOUS) ×3 IMPLANT
SNARE SHORT THROW 13M SML OVAL (MISCELLANEOUS) ×3 IMPLANT
SPHINCTEROTOME HYDRATOME 44 (MISCELLANEOUS) ×3 IMPLANT
SPONGE GAUZE 4X4 12PLY (GAUZE/BANDAGES/DRESSINGS) ×3 IMPLANT
SYR 50ML LL SCALE MARK (SYRINGE) ×6 IMPLANT
SYSTEM CONTINUOUS INJECTION (MISCELLANEOUS) ×3 IMPLANT
TUBING ENDO SMARTCAP PENTAX (MISCELLANEOUS) ×3 IMPLANT
WATER STERILE IRR 1000ML POUR (IV SOLUTION) ×6 IMPLANT

## 2014-01-13 NOTE — H&P (Signed)
Catherine Ramirez is an 28 y.o. female.   Chief Complaint: Patient is here for ERCP and stent removal. HPI: Patient is a 28 year old Caucasian female who developed post cholecystectomy subhepatic biliary leak and underwent stenting on 11/18/2013. She's on returning for stent removal. She feels fine. She hasn't had any pain in over a month. She denies nausea vomiting fever or chills. She says she has lost some weight she's been trying hard to do so.  Past Medical History  Diagnosis Date  . Arrhythmia   . Chronic pain syndrome   . IBS (irritable bowel syndrome)   . Chronic constipation   . Chronic pelvic pain in female     Past Surgical History  Procedure Laterality Date  . Cesarean section    . Pelvic fracture surgery    . Tonsillectomy    . Tubal ligation    . Ovarian cyst removal Left   . Laparoscopic unilateral salpingo oopherectomy Left 10/11/2013    Procedure:  LAPAROSCOPIC LEFT SALPINGO OOPHORECTOMY;  Surgeon: [name], MD;  Location: [facility];  Service: Gynecology;  Laterality: Left;  . Bilateral salpingectomy Right 10/11/2013    Procedure: RIGHT SALPINGECTOMY;  Surgeon: [name], MD;  Location: [facility];  Service: Gynecology;  Laterality: Right;  . Diagnostic laparoscopy with removal of ectopic pregnancy N/A 10/15/2013    Procedure: DIAGNOSTIC LAPAROSCOPY ;  Surgeon: [name], MD;  Location: [facility];  Service: Gynecology;  Laterality: N/A;  . Cholecystectomy N/A 11/10/2013    Procedure: LAPAROSCOPIC CHOLECYSTECTOMY;  Surgeon: Scherry Ran, MD;  Location: [facility];  Service: General;  Laterality: N/A;  . Ercp N/A 11/18/2013    Procedure: ENDOSCOPIC RETROGRADE CHOLANGIOPANCREATOGRAPHY (ERCP) WITH SMALL SPHINCTEROTOMY;  Surgeon: Rogene Houston, MD;  Location: [facility];  Service: Endoscopy;  Laterality: N/A;    History reviewed. No pertinent family history. Social History:  reports that she has been smoking Cigarettes.  She has a 2 pack-year smoking history. She has  never used smokeless tobacco. She reports that she drinks alcohol. She reports that she does not use illicit drugs.  Allergies:  Allergies  Allergen Reactions  . Amoxicillin Anaphylaxis, Hives and Rash    Able to take Ancef as prophylaxis without reaction  . Bee Venom Anaphylaxis  . Shellfish Allergy Anaphylaxis  . Iodine Rash  . Other Rash    GRASS  . Penicillins Hives and Rash    Medications Prior to Admission  Medication Sig Dispense Refill  . carisoprodol (SOMA) 350 MG tablet Take 1 tablet (350 mg total) by mouth 4 (four) times daily.  30 tablet  0  . docusate sodium 100 MG CAPS Take 100 mg by mouth 2 (two) times daily.  10 capsule  0  . gabapentin (NEURONTIN) 300 MG capsule Take 2 capsules (600 mg total) by mouth 3 (three) times daily.  90 capsule  0  . Linaclotide (LINZESS) 145 MCG CAPS capsule Take 1 capsule (145 mcg total) by mouth daily.  30 capsule  1  . pantoprazole (PROTONIX) 40 MG tablet Take 1 tablet (40 mg total) by mouth at bedtime.  30 tablet  0  . Biotin 1000 MCG tablet Take 1,000 mcg by mouth daily.      Marland Kitchen EPINEPHrine (EPIPEN) 0.3 mg/0.3 mL DEVI Inject 0.3 mg into the muscle once.      Marland Kitchen levofloxacin (LEVAQUIN) 750 MG tablet Take 1 tablet (750 mg total) by mouth daily.  7 tablet  0  . polyethylene glycol (MIRALAX / GLYCOLAX)  packet Take 17 g by mouth daily.  14 each  0    Results for orders placed during the hospital encounter of 01/12/14 (from the past 48 hour(s))  HCG, SERUM, QUALITATIVE     Status: None   Collection Time    01/12/14 11:00 AM      Result Value Ref Range   Preg, Serum NEGATIVE  NEGATIVE  CBC WITH DIFFERENTIAL     Status: Abnormal   Collection Time    01/12/14 11:00 AM      Result Value Ref Range   WBC 10.1  4.0 - 10.5 K/uL   RBC 3.77 (*) 3.87 - 5.11 MIL/uL   Hemoglobin 11.0 (*) 12.0 - 15.0 g/dL   HCT 33.3 (*) 36.0 - 46.0 %   MCV 88.3  78.0 - 100.0 fL   MCH 29.2  26.0 - 34.0 pg   MCHC 33.0  30.0 - 36.0 g/dL   RDW 14.9  11.5 - 15.5 %    Platelets 477 (*) 150 - 400 K/uL   Neutrophils Relative % 70  43 - 77 %   Neutro Abs 7.0  1.7 - 7.7 K/uL   Lymphocytes Relative 24  12 - 46 %   Lymphs Abs 2.4  0.7 - 4.0 K/uL   Monocytes Relative 5  3 - 12 %   Monocytes Absolute 0.5  0.1 - 1.0 K/uL   Eosinophils Relative 1  0 - 5 %   Eosinophils Absolute 0.1  0.0 - 0.7 K/uL   Basophils Relative 0  0 - 1 %   Basophils Absolute 0.0  0.0 - 0.1 K/uL  COMPREHENSIVE METABOLIC PANEL     Status: Abnormal   Collection Time    01/12/14 11:00 AM      Result Value Ref Range   Sodium 140  137 - 147 mEq/L   Potassium 4.1  3.7 - 5.3 mEq/L   Chloride 100  96 - 112 mEq/L   CO2 28  19 - 32 mEq/L   Glucose, Bld 94  70 - 99 mg/dL   BUN 17  6 - 23 mg/dL   Creatinine, Ser 0.57  0.50 - 1.10 mg/dL   Calcium 9.1  8.4 - 10.5 mg/dL   Total Protein 7.7  6.0 - 8.3 g/dL   Albumin 3.3 (*) 3.5 - 5.2 g/dL   AST 12  0 - 37 U/L   ALT 6  0 - 35 U/L   Alkaline Phosphatase 79  39 - 117 U/L   Total Bilirubin 0.2 (*) 0.3 - 1.2 mg/dL   GFR calc non Af Amer >90  >90 mL/min   GFR calc Af Amer >90  >90 mL/min   Comment: (NOTE)     The eGFR has been calculated using the CKD EPI equation.     This calculation has not been validated in all clinical situations.     eGFR's persistently <90 mL/min signify possible Chronic Kidney     Disease.   No results found.  ROS  Blood pressure 93/55, pulse 88, temperature 98.1 F (36.7 C), temperature source Oral, resp. rate 18, height $RemoveBe'5\' 5"'GtEVvRguq$  (1.651 m), weight 129 lb (58.514 kg), last menstrual period 12/20/2013, SpO2 98.00%. Physical Exam  Constitutional: She appears well-developed and well-nourished.  HENT:  Mouth/Throat: Oropharynx is clear and moist.  Eyes: Conjunctivae are normal. No scleral icterus.  Neck: No thyromegaly present.  Cardiovascular: Normal rate, regular rhythm and normal heart sounds.   No murmur heard. Respiratory: Effort normal and breath sounds  normal.  GI: Soft. She exhibits no distension and no mass.  There is no tenderness.  Musculoskeletal: She exhibits no edema.  Lymphadenopathy:    She has no cervical adenopathy.  Neurological: She is alert.  Skin: Skin is warm and dry.     Assessment/Plan History of post cholecystectomy subhepatic biliary leak treated with stenting. Patient is here for ERCP with stent removal.  Catherine Ramirez U 01/13/2014, 7:22 AM

## 2014-01-13 NOTE — Anesthesia Postprocedure Evaluation (Signed)
  Anesthesia Post-op Note  Patient: Catherine Ramirez  Procedure(s) Performed: Procedure(s): ENDOSCOPIC RETROGRADE CHOLANGIOPANCREATOGRAPHY (ERCP) (N/A) STENT REMOVAL (N/A) SPHINCTEROTOMY (N/A)  Patient Location: PACU  Anesthesia Type:General  Level of Consciousness: awake, alert  and patient cooperative  Airway and Oxygen Therapy: Patient Spontanous Breathing  Post-op Pain: mild  Post-op Assessment: Post-op Vital signs reviewed, Patient's Cardiovascular Status Stable, Respiratory Function Stable and Patent Airway  Post-op Vital Signs: Reviewed and stable  Last Vitals:  Filed Vitals:   01/13/14 0900  BP: 99/70  Pulse: 76  Temp:   Resp: 13    Complications: No apparent anesthesia complications

## 2014-01-13 NOTE — Anesthesia Preprocedure Evaluation (Addendum)
Anesthesia Evaluation  Patient identified by MRN, date of birth, ID band Patient awake    Reviewed: Allergy & Precautions, H&P , NPO status , Patient's Chart, lab work & pertinent test results, reviewed documented beta blocker date and time   History of Anesthesia Complications (+) DIFFICULT AIRWAY and Emergence Delirium  Airway Mallampati: II TM Distance: >3 FB Neck ROM: Full    Dental  (+) Teeth Intact   Pulmonary Current Smoker,  breath sounds clear to auscultation        Cardiovascular Exercise Tolerance: Good + dysrhythmias Rhythm:Regular Rate:Normal     Neuro/Psych Anxiety    GI/Hepatic Vomit x1 this afternoon   Endo/Other    Renal/GU      Musculoskeletal   Abdominal (+)  Abdomen: soft. Bowel sounds: normal.  Peds  Hematology  (+) anemia ,   Anesthesia Other Findings   Reproductive/Obstetrics                          Anesthesia Physical Anesthesia Plan  ASA: II  Anesthesia Plan: General   Post-op Pain Management:    Induction: Intravenous, Rapid sequence and Cricoid pressure planned  Airway Management Planned: Oral ETT  Additional Equipment:   Intra-op Plan:   Post-operative Plan: Extubation in OR  Informed Consent: I have reviewed the patients History and Physical, chart, labs and discussed the procedure including the risks, benefits and alternatives for the proposed anesthesia with the patient or authorized representative who has indicated his/her understanding and acceptance.   Dental advisory given  Plan Discussed with: Surgeon  Anesthesia Plan Comments:        Anesthesia Quick Evaluation

## 2014-01-13 NOTE — Op Note (Signed)
ERCP PROCEDURE REPORT  PATIENT:  Catherine Ramirez  MR#:  [id number] Birthdate:  04-Dec-1985, 28 y.o., female Endoscopist:  Dr. Rogene Houston, MD Referred By:  Dr. [name], MD Procedure Date: 01/13/2014  Procedure:   ERCP    Indications:  Patient is 28 year old Caucasian female who underwent biliary stenting on 11/18/2013 for subhepatic leak following laparoscopic cholecystectomy. She is doing well and not returning for stent removal.            Informed Consent:  The risks, benefits, limitations, alternatives, and mponderable have been reviewed with the patient. I specifically discussed a 1 in 10 chance of pancreatitis, reaction to medications, bleeding, perforation and the possibility of a failed ERCP. Potential for sphincterotomy and stent placement also reviewed. Questions have been answered. All parties agreeable.  Please see history & physical in medical record for more information.  Medications:  *Please see anesthesia record for complete details  Description of procedure:  Procedure performed in the OR. The patient was placed under anesthesia, intubated, and turned into semipermanent position. Therapeutic Pentax video duodenoscope passed through the oropharynx without any difficulty into the esophagus, stomach, and across the pylorus and pull, and descending duodenum.  CBD cannulated with Rx 44 autotome and 035 hydra jagwire.  Findings:  Normal mucosa of antrum and pyloric channel. Biliary stent grabbed with a polypectomy snare and removed under fluoroscopic control. Biliary sphincterotomy extended. Occlusion cholangiography revealed no filling defects. Long cystic duct remnant. Right hepatic filled with contrast and no biliary leak noted. 10 mm diameter balloon passed across sphincterotomy without difficulty.   Therapeutic/Diagnostic Maneuvers Performed:  See above  Complications:  None  Impression:  Biliary stent removed. biliary sphincterotomy extended. Biliary  leak has healed.  Recommendations:  Clear liquids today. No aspirin for 3 days. Patient to have CBC in one month.  Zelma Snead U  01/13/2014  9:05 AM  CC: Dr. [name], MD & Dr. Rayne Du ref. provider found

## 2014-01-13 NOTE — Transfer of Care (Signed)
Immediate Anesthesia Transfer of Care Note  Patient: CHANIYAH JAHR  Procedure(s) Performed: Procedure(s): ENDOSCOPIC RETROGRADE CHOLANGIOPANCREATOGRAPHY (ERCP) (N/A) STENT REMOVAL (N/A) SPHINCTEROTOMY (N/A)  Patient Location: PACU  Anesthesia Type:General  Level of Consciousness: sedated and patient cooperative  Airway & Oxygen Therapy: Patient Spontanous Breathing and non-rebreather face mask  Post-op Assessment: Report given to PACU RN and Patient moving all extremities  Post vital signs: Reviewed and stable  Complications: No apparent anesthesia complications

## 2014-01-13 NOTE — Discharge Instructions (Signed)
Clear liquids today; usual diet starting in a.m. Resume usual medications but no aspirin for 3 days. No driving for 24 hours. CBC in 1 month. Office will call.  Gastrointestinal Endoscopy Care After Refer to this sheet in the next few weeks. These instructions provide you with information on caring for yourself after your procedure. Your caregiver may also give you more specific instructions. Your treatment has been planned according to current medical practices, but problems sometimes occur. Call your caregiver if you have any problems or questions after your procedure. HOME CARE INSTRUCTIONS  If you were given medicine to help you relax (sedative), do not drive, operate machinery, or sign important documents for 24 hours.  Avoid alcohol and hot or warm beverages for the first 24 hours after the procedure.  Only take over-the-counter or prescription medicines for pain, discomfort, or fever as directed by your caregiver. You may resume taking your normal medicines unless your caregiver tells you otherwise. Ask your caregiver when you may resume taking medicines that may cause bleeding, such as aspirin, clopidogrel, or warfarin.  You may return to your normal diet and activities on the day after your procedure, or as directed by your caregiver. Walking may help to reduce any bloated feeling in your abdomen.  Drink enough fluids to keep your urine clear or pale yellow.  You may gargle with salt water if you have a sore throat. SEEK IMMEDIATE MEDICAL CARE IF:  You have severe nausea or vomiting.  You have severe abdominal pain, abdominal cramps that last longer than 6 hours, or abdominal swelling (distention).  You have severe shoulder or back pain.  You have trouble swallowing.  You have shortness of breath, your breathing is shallow, or you are breathing faster than normal.  You have a fever or a rapid heartbeat.  You vomit blood or material that looks like coffee grounds.  You  have bloody, black, or tarry stools. MAKE SURE YOU:  Understand these instructions.  Will watch your condition.  Will get help right away if you are not doing well or get worse.   Diet The clear liquid diet consists of foods that are liquid or will become liquid at room temperature. Examples of foods allowed on a clear liquid diet include fruit juice, broth or bouillon, gelatin, or frozen ice pops. You should be able to see through the liquid. The purpose of this diet is to provide the necessary fluids, electrolytes (such as sodium and potassium), and energy to keep the body functioning during times when you are not able to consume a regular diet. A clear liquid diet should not be continued for long periods of time, as it is not nutritionally adequate.  A CLEAR LIQUID DIET MAY BE NEEDED:  When a sudden-onset (acute) condition occurs before or after surgery.   As the first step in oral feeding.   For fluid and electrolyte replacement in diarrheal diseases.   As a diet before certain medical tests are performed.  ADEQUACY The clear liquid diet is adequate only in ascorbic acid, according to the Recommended Dietary Allowances of the [employer].  CHOOSING FOODS Breads and Starches  Allowed: None are allowed.   Avoid: All are to be avoided.  Vegetables  Allowed: Strained vegetable juices.   Avoid: Any others.  Fruit  Allowed: Strained fruit juices and fruit drinks. Include 1 serving of citrus or vitamin C-enriched fruit juice daily.   Avoid: Any others.  Meat and Meat Substitutes  Allowed: None are allowed.  Avoid: All are to be avoided.  Milk Products  Allowed: None are allowed.   Avoid: All are to be avoided.  Soups and Combination Foods  Allowed: Clear bouillon, broth, or strained broth-based soups.   Avoid: Any others.  Desserts and Sweets  Allowed: Sugar, honey. High-protein gelatin. Flavored gelatin, ices,  or frozen ice pops that do not contain milk.   Avoid: Any others.  Fats and Oils  Allowed: None are allowed.   Avoid: All are to be avoided.  Beverages  Allowed: Cereal beverages, coffee (regular or decaffeinated), tea, or soda at the discretion of your health care provider.   Avoid: Any others.  Condiments  Allowed: Salt.   Avoid: Any others, including pepper.  Supplements  Allowed: Liquid nutrition beverages that you can see through.   Avoid: Any others that contain lactose or fiber. SAMPLE MEAL PLAN Breakfast  4 oz (120 mL) strained orange juice.   to 1 cup (120 to 240 mL) gelatin (plain or fortified).  1 cup (240 mL) beverage (coffee or tea).  Sugar, if desired. Midmorning Snack   cup (120 mL) gelatin (plain or fortified). Lunch  1 cup (240 mL) broth or consomm.  4 oz (120 mL) strained grapefruit juice.   cup (120 mL) gelatin (plain or fortified).  1 cup (240 mL) beverage (coffee or tea).  Sugar, if desired. Midafternoon Snack   cup (120 mL) fruit ice.   cup (120 mL) strained fruit juice. Dinner  1 cup (240 mL) broth or consomm.   cup (120 mL) cranberry juice.   cup (120 mL) flavored gelatin (plain or fortified).  1 cup (240 mL) beverage (coffee or tea).  Sugar, if desired. Evening Snack  4 oz (120 mL) strained apple juice (vitamin C-fortified).   cup (120 mL) flavored gelatin (plain or fortified). MAKE SURE YOU:  Understand these instructions.  Will watch your child's condition.  Will get help right away if your child is not doing well or gets worse. Marland Kitchen

## 2014-01-13 NOTE — Anesthesia Procedure Notes (Addendum)
Procedure Name: Intubation Date/Time: 01/13/2014 7:41 AM Performed by: Vista Deck Pre-anesthesia Checklist: Patient identified, Patient being monitored, Timeout performed, Emergency Drugs available and Suction available Patient Re-evaluated:Patient Re-evaluated prior to inductionOxygen Delivery Method: Circle System Utilized Preoxygenation: Pre-oxygenation with 100% oxygen Intubation Type: IV induction, Rapid sequence and Cricoid Pressure applied Laryngoscope Size: Mac and 3 Grade View: Grade II Tube type: Oral Tube size: 7.0 mm Number of attempts: 1 Airway Equipment and Method: stylet Placement Confirmation: ETT inserted through vocal cords under direct vision,  positive ETCO2 and breath sounds checked- equal and bilateral Secured at: 21 cm Tube secured with: Tape Dental Injury: Teeth and Oropharynx as per pre-operative assessment

## 2014-01-13 NOTE — Telephone Encounter (Signed)
Per Dr.Rehman the patient will need to have labs drawn in 1 month. 

## 2014-01-18 ENCOUNTER — Other Ambulatory Visit (INDEPENDENT_AMBULATORY_CARE_PROVIDER_SITE_OTHER): Payer: Self-pay | Admitting: *Deleted

## 2014-01-18 ENCOUNTER — Encounter (INDEPENDENT_AMBULATORY_CARE_PROVIDER_SITE_OTHER): Payer: Self-pay | Admitting: *Deleted

## 2014-01-18 DIAGNOSIS — R7989 Other specified abnormal findings of blood chemistry: Secondary | ICD-10-CM

## 2014-01-24 ENCOUNTER — Other Ambulatory Visit (HOSPITAL_COMMUNITY): Payer: Self-pay | Admitting: Internal Medicine

## 2014-01-24 ENCOUNTER — Ambulatory Visit (HOSPITAL_COMMUNITY)
Admission: RE | Admit: 2014-01-24 | Discharge: 2014-01-24 | Disposition: A | Discharge status: Home / Self Care | Payer: Medicaid Other | Source: Ambulatory Visit | Attending: Internal Medicine | Admitting: Internal Medicine

## 2014-01-24 DIAGNOSIS — R52 Pain, unspecified: Secondary | ICD-10-CM

## 2014-01-24 DIAGNOSIS — M25559 Pain in unspecified hip: Secondary | ICD-10-CM | POA: Insufficient documentation

## 2014-02-02 ENCOUNTER — Telehealth (INDEPENDENT_AMBULATORY_CARE_PROVIDER_SITE_OTHER): Payer: Self-pay | Admitting: *Deleted

## 2014-02-02 MED ORDER — LINACLOTIDE 145 MCG PO CAPS: 145.0000 ug | ORAL_CAPSULE | Freq: Every day | ORAL | Status: DC

## 2014-02-02 NOTE — Telephone Encounter (Signed)
May fill with 5 refills per Dr.Rehman. Patient will need to have appointment prior to further refills per Dr.Rehman.

## 2014-02-02 NOTE — Telephone Encounter (Signed)
A refill request has been sent to the patient's pharmacy.

## 2014-02-02 NOTE — Telephone Encounter (Signed)
Dr. Glo Herring started her on Linzess 145 then was told to continue it by Dr. Laural Golden. Please send a Rx to her pharmacy. The return phone number is [phone].

## 2014-02-13 NOTE — Telephone Encounter (Signed)
LM for patient to return the call.  

## 2014-02-24 ENCOUNTER — Ambulatory Visit: Payer: Medicaid Other | Attending: Orthopaedic Surgery

## 2014-02-24 DIAGNOSIS — M25559 Pain in unspecified hip: Secondary | ICD-10-CM | POA: Insufficient documentation

## 2014-02-24 DIAGNOSIS — IMO0001 Reserved for inherently not codable concepts without codable children: Secondary | ICD-10-CM | POA: Diagnosis not present

## 2014-02-28 ENCOUNTER — Ambulatory Visit (HOSPITAL_COMMUNITY): Payer: Medicaid Other

## 2014-02-28 ENCOUNTER — Other Ambulatory Visit (HOSPITAL_COMMUNITY): Payer: Self-pay | Admitting: Orthopaedic Surgery

## 2014-02-28 ENCOUNTER — Ambulatory Visit (HOSPITAL_COMMUNITY)
Admission: RE | Admit: 2014-02-28 | Discharge: 2014-02-28 | Disposition: A | Discharge status: Home / Self Care | Payer: Medicaid Other | Source: Ambulatory Visit | Attending: Orthopaedic Surgery | Admitting: Orthopaedic Surgery

## 2014-02-28 DIAGNOSIS — X58XXXA Exposure to other specified factors, initial encounter: Secondary | ICD-10-CM | POA: Insufficient documentation

## 2014-02-28 DIAGNOSIS — IMO0002 Reserved for concepts with insufficient information to code with codable children: Secondary | ICD-10-CM | POA: Diagnosis not present

## 2014-02-28 DIAGNOSIS — M25552 Pain in left hip: Secondary | ICD-10-CM

## 2014-02-28 DIAGNOSIS — M25559 Pain in unspecified hip: Secondary | ICD-10-CM | POA: Diagnosis present

## 2014-03-01 ENCOUNTER — Ambulatory Visit (HOSPITAL_COMMUNITY): Payer: Medicaid Other

## 2014-03-01 LAB — CBC
HCT: 31.9 % — ABNORMAL LOW (ref 36.0–46.0)
Hemoglobin: 10.5 g/dL — ABNORMAL LOW (ref 12.0–15.0)
MCH: 28 pg (ref 26.0–34.0)
MCHC: 32.9 g/dL (ref 30.0–36.0)
MCV: 85.1 fL (ref 78.0–100.0)
Platelets: 530 10*3/uL — ABNORMAL HIGH (ref 150–400)
RBC: 3.75 MIL/uL — AB (ref 3.87–5.11)
RDW: 16.2 % — AB (ref 11.5–15.5)
WBC: 12.6 10*3/uL — AB (ref 4.0–10.5)

## 2014-03-02 NOTE — Telephone Encounter (Signed)
LM for patient to return the call to schedule a f/u apt.

## 2014-03-06 ENCOUNTER — Telehealth (INDEPENDENT_AMBULATORY_CARE_PROVIDER_SITE_OTHER): Payer: Self-pay | Admitting: *Deleted

## 2014-03-06 NOTE — Telephone Encounter (Signed)
Dr. Loraine Grip stating he needed to speak with her. Catherine Ramirez was returning his call if he would please call her at at [phone] or [phone].

## 2014-03-06 NOTE — Telephone Encounter (Signed)
Call returned earlier today. Results of CBC reviewed with patient. Her periods are not heavy. She will begin ferrous sulfate 325 mg by mouth daily Flintstones with iron 2 tablets daily CBC with differential in 4 weeks.

## 2014-03-06 NOTE — Telephone Encounter (Signed)
Forwarded to Dr.Rehman. 

## 2014-03-07 ENCOUNTER — Telehealth (INDEPENDENT_AMBULATORY_CARE_PROVIDER_SITE_OTHER): Payer: Self-pay | Admitting: *Deleted

## 2014-03-07 DIAGNOSIS — D509 Iron deficiency anemia, unspecified: Secondary | ICD-10-CM

## 2014-03-07 NOTE — Telephone Encounter (Signed)
Per Dr.Rehman the patient will need to have labs drawn in 4 weeks.   

## 2014-03-07 NOTE — Telephone Encounter (Signed)
Lab has been noted for 04/04/14.

## 2014-03-09 ENCOUNTER — Telehealth (INDEPENDENT_AMBULATORY_CARE_PROVIDER_SITE_OTHER): Payer: Self-pay | Admitting: *Deleted

## 2014-03-09 DIAGNOSIS — R7989 Other specified abnormal findings of blood chemistry: Secondary | ICD-10-CM

## 2014-03-09 NOTE — Telephone Encounter (Signed)
Per Dr.Rehman the patient will need to have labs drawn in 4 weeks.   

## 2014-03-22 ENCOUNTER — Other Ambulatory Visit (INDEPENDENT_AMBULATORY_CARE_PROVIDER_SITE_OTHER): Payer: Self-pay | Admitting: *Deleted

## 2014-03-22 ENCOUNTER — Encounter (INDEPENDENT_AMBULATORY_CARE_PROVIDER_SITE_OTHER): Payer: Self-pay | Admitting: *Deleted

## 2014-03-22 DIAGNOSIS — R7989 Other specified abnormal findings of blood chemistry: Secondary | ICD-10-CM

## 2014-03-22 DIAGNOSIS — D509 Iron deficiency anemia, unspecified: Secondary | ICD-10-CM

## 2014-03-22 NOTE — Telephone Encounter (Signed)
LM for patient to return the call to schedule a f/u apt.

## 2014-04-14 NOTE — Telephone Encounter (Signed)
LM that this will be her finial call to get a medication f/u apt. Please return the call to Jerauld at [phone].

## 2014-04-22 ENCOUNTER — Emergency Department (HOSPITAL_COMMUNITY): Payer: Medicaid Other

## 2014-04-22 ENCOUNTER — Inpatient Hospital Stay (HOSPITAL_COMMUNITY)
Admission: EM | Admit: 2014-04-22 | Discharge: 2014-04-24 | DRG: 540 | Disposition: A | Discharge status: Other Acute Inpatient Hospital | Payer: Medicaid Other | Attending: Internal Medicine | Admitting: Internal Medicine

## 2014-04-22 ENCOUNTER — Encounter (HOSPITAL_COMMUNITY): Payer: Self-pay | Admitting: Emergency Medicine

## 2014-04-22 DIAGNOSIS — L02419 Cutaneous abscess of limb, unspecified: Secondary | ICD-10-CM | POA: Diagnosis present

## 2014-04-22 DIAGNOSIS — Z79899 Other long term (current) drug therapy: Secondary | ICD-10-CM

## 2014-04-22 DIAGNOSIS — Z8781 Personal history of (healed) traumatic fracture: Secondary | ICD-10-CM

## 2014-04-22 DIAGNOSIS — Z23 Encounter for immunization: Secondary | ICD-10-CM

## 2014-04-22 DIAGNOSIS — G894 Chronic pain syndrome: Secondary | ICD-10-CM | POA: Diagnosis present

## 2014-04-22 DIAGNOSIS — M86659 Other chronic osteomyelitis, unspecified thigh: Principal | ICD-10-CM | POA: Diagnosis present

## 2014-04-22 DIAGNOSIS — M629 Disorder of muscle, unspecified: Secondary | ICD-10-CM | POA: Diagnosis present

## 2014-04-22 DIAGNOSIS — G8918 Other acute postprocedural pain: Secondary | ICD-10-CM

## 2014-04-22 DIAGNOSIS — R109 Unspecified abdominal pain: Secondary | ICD-10-CM

## 2014-04-22 DIAGNOSIS — M25559 Pain in unspecified hip: Secondary | ICD-10-CM | POA: Diagnosis present

## 2014-04-22 DIAGNOSIS — I959 Hypotension, unspecified: Secondary | ICD-10-CM | POA: Diagnosis present

## 2014-04-22 DIAGNOSIS — M1990 Unspecified osteoarthritis, unspecified site: Secondary | ICD-10-CM

## 2014-04-22 DIAGNOSIS — Z9889 Other specified postprocedural states: Secondary | ICD-10-CM

## 2014-04-22 DIAGNOSIS — L0291 Cutaneous abscess, unspecified: Secondary | ICD-10-CM | POA: Diagnosis present

## 2014-04-22 DIAGNOSIS — F17200 Nicotine dependence, unspecified, uncomplicated: Secondary | ICD-10-CM | POA: Diagnosis present

## 2014-04-22 DIAGNOSIS — D509 Iron deficiency anemia, unspecified: Secondary | ICD-10-CM | POA: Diagnosis present

## 2014-04-22 DIAGNOSIS — M2420 Disorder of ligament, unspecified site: Secondary | ICD-10-CM | POA: Diagnosis present

## 2014-04-22 DIAGNOSIS — K589 Irritable bowel syndrome without diarrhea: Secondary | ICD-10-CM | POA: Diagnosis present

## 2014-04-22 DIAGNOSIS — K5900 Constipation, unspecified: Secondary | ICD-10-CM

## 2014-04-22 DIAGNOSIS — L03119 Cellulitis of unspecified part of limb: Secondary | ICD-10-CM

## 2014-04-22 DIAGNOSIS — D638 Anemia in other chronic diseases classified elsewhere: Secondary | ICD-10-CM | POA: Diagnosis present

## 2014-04-22 DIAGNOSIS — D271 Benign neoplasm of left ovary: Secondary | ICD-10-CM

## 2014-04-22 DIAGNOSIS — R197 Diarrhea, unspecified: Secondary | ICD-10-CM

## 2014-04-22 DIAGNOSIS — K5909 Other constipation: Secondary | ICD-10-CM

## 2014-04-22 DIAGNOSIS — K8010 Calculus of gallbladder with chronic cholecystitis without obstruction: Secondary | ICD-10-CM

## 2014-04-22 DIAGNOSIS — D649 Anemia, unspecified: Secondary | ICD-10-CM

## 2014-04-22 DIAGNOSIS — F17218 Nicotine dependence, cigarettes, with other nicotine-induced disorders: Secondary | ICD-10-CM

## 2014-04-22 LAB — CBC
HEMATOCRIT: 28 % — AB (ref 36.0–46.0)
HEMOGLOBIN: 9 g/dL — AB (ref 12.0–15.0)
MCH: 26.3 pg (ref 26.0–34.0)
MCHC: 32.1 g/dL (ref 30.0–36.0)
MCV: 81.9 fL (ref 78.0–100.0)
Platelets: 443 10*3/uL — ABNORMAL HIGH (ref 150–400)
RBC: 3.42 MIL/uL — ABNORMAL LOW (ref 3.87–5.11)
RDW: 15.6 % — ABNORMAL HIGH (ref 11.5–15.5)
WBC: 9.8 10*3/uL (ref 4.0–10.5)

## 2014-04-22 LAB — URINE MICROSCOPIC-ADD ON

## 2014-04-22 LAB — BASIC METABOLIC PANEL
Anion gap: 11 (ref 5–15)
BUN: 19 mg/dL (ref 6–23)
CALCIUM: 9.1 mg/dL (ref 8.4–10.5)
CO2: 30 mEq/L (ref 19–32)
CREATININE: 0.67 mg/dL (ref 0.50–1.10)
Chloride: 99 mEq/L (ref 96–112)
GFR calc Af Amer: >90 mL/min (ref >90)
GLUCOSE: 81 mg/dL (ref 70–99)
Potassium: 4 mEq/L (ref 3.7–5.3)
Sodium: 140 mEq/L (ref 137–147)

## 2014-04-22 LAB — URINALYSIS, ROUTINE W REFLEX MICROSCOPIC
Bilirubin Urine: NEGATIVE
Glucose, UA: NEGATIVE mg/dL
KETONES UR: NEGATIVE mg/dL
LEUKOCYTES UA: NEGATIVE
Nitrite: NEGATIVE
PROTEIN: NEGATIVE mg/dL
Specific Gravity, Urine: 1.022 (ref 1.005–1.030)
UROBILINOGEN UA: 0.2 mg/dL (ref 0.0–1.0)
pH: 6 (ref 5.0–8.0)

## 2014-04-22 LAB — I-STAT CG4 LACTIC ACID, ED
Lactic Acid, Venous: <0.3 mmol/L — ABNORMAL LOW (ref 0.5–2.2)
Lactic Acid, Venous: 0.4 mmol/L — ABNORMAL LOW (ref 0.5–2.2)

## 2014-04-22 LAB — POC URINE PREG, ED: PREG TEST UR: NEGATIVE

## 2014-04-22 MED ORDER — ONDANSETRON HCL 4 MG/2ML IJ SOLN: 4.0000 mg | Freq: Three times a day (TID) | INTRAMUSCULAR | Status: DC | PRN

## 2014-04-22 MED ORDER — HYDROMORPHONE HCL 1 MG/ML IJ SOLN
1.0000 mg | INTRAMUSCULAR | Status: DC | PRN
  Administered 2014-04-22: 1 mg via INTRAVENOUS
  Filled 2014-04-22: qty 1

## 2014-04-22 MED ORDER — SODIUM CHLORIDE 0.9 % IV BOLUS (SEPSIS)
1000.0000 mL | Freq: Once | INTRAVENOUS | Status: AC
  Administered 2014-04-22: 1000 mL via INTRAVENOUS

## 2014-04-22 MED ORDER — IOHEXOL 300 MG/ML  SOLN
25.0000 mL | Freq: Once | INTRAMUSCULAR | Status: AC | PRN
  Administered 2014-04-22: 25 mL via ORAL

## 2014-04-22 MED ORDER — FENTANYL CITRATE 0.05 MG/ML IJ SOLN
50.0000 ug | Freq: Once | INTRAMUSCULAR | Status: AC
  Administered 2014-04-22: 50 ug via INTRAVENOUS
  Filled 2014-04-22: qty 2

## 2014-04-22 MED ORDER — DIPHENHYDRAMINE HCL 50 MG/ML IJ SOLN
50.0000 mg | Freq: Once | INTRAMUSCULAR | Status: AC
  Administered 2014-04-23: 50 mg via INTRAVENOUS
  Filled 2014-04-22: qty 1

## 2014-04-22 MED ORDER — DEXTROSE 5 % IV SOLN
2.0000 g | Freq: Once | INTRAVENOUS | Status: AC
  Administered 2014-04-23: 2 g via INTRAVENOUS
  Filled 2014-04-22 (×2): qty 2

## 2014-04-22 MED ORDER — MORPHINE SULFATE 2 MG/ML IJ SOLN: 2.0000 mg | Freq: Once | INTRAMUSCULAR | Status: DC

## 2014-04-22 MED ORDER — MORPHINE SULFATE 4 MG/ML IJ SOLN
4.0000 mg | Freq: Once | INTRAMUSCULAR | Status: AC
  Administered 2014-04-22: 4 mg via INTRAVENOUS
  Filled 2014-04-22: qty 1

## 2014-04-22 MED ORDER — ONDANSETRON HCL 4 MG/2ML IJ SOLN
4.0000 mg | Freq: Once | INTRAMUSCULAR | Status: AC
  Administered 2014-04-22: 4 mg via INTRAVENOUS
  Filled 2014-04-22: qty 2

## 2014-04-22 MED ORDER — IOHEXOL 300 MG/ML  SOLN
100.0000 mL | Freq: Once | INTRAMUSCULAR | Status: AC | PRN
  Administered 2014-04-22: 100 mL via INTRAVENOUS

## 2014-04-22 MED ORDER — SODIUM CHLORIDE 0.9 % IV SOLN
INTRAVENOUS | Status: AC
  Administered 2014-04-22: via INTRAVENOUS

## 2014-04-22 MED ORDER — VANCOMYCIN HCL IN DEXTROSE 1-5 GM/200ML-% IV SOLN
1000.0000 mg | Freq: Once | INTRAVENOUS | Status: AC
  Administered 2014-04-23: 1000 mg via INTRAVENOUS
  Filled 2014-04-22: qty 200

## 2014-04-22 NOTE — ED Provider Notes (Signed)
CSN: [id number]     Arrival date & time 04/22/14  1721 History   First MD Initiated Contact with Patient 04/22/14 1814     Chief Complaint  Patient presents with  . Abscess     (Consider location/radiation/quality/duration/timing/severity/associated sxs/prior Treatment) Patient is a 28 y.o. female presenting with abscess. The history is provided by the patient and medical records. No language interpreter was used.  Abscess Associated symptoms: no fatigue, no fever, no headaches, no nausea and no vomiting     Catherine Ramirez is a 28 y.o. female  with a hx of chronic pain syndrome, chronic pelvic pain, chronic constipation presents to the Emergency Department complaining of gradual, persistent, progressively worsening abscess to the left hip at the iliac crest onset 4 weeks ago.  Patient reports that in 2003 she had surgical placement of hardware for a pelvis fracture at [facility], but has not followed with them in many years.  She reports that since that time the site of placement intermittently becomes infected draining pus but then resolves on its own. She reports 4 weeks ago the site on the left became infected it did not drain as usual. She reports that she had erythema and swelling to the left groin thereafter on her back which is improved since then however approximately one week ago the wound ulcerated open and began draining. She reports having a prescription of clindamycin at home which she has taken for the last week without improvement.  Patient has not seen anyone for this.  She reports history of left salpingo oophorectomy in March of 2015 for ectopic pregnancy without further complication. Associated symptoms include low-grade fever last week, pain at the site.  Nothing makes it better and nothing makes it worse.  Pt denies headache, neck pain, chest pain, shortness of breath, abdominal pain, nausea, vomiting, diarrhea, weakness, dizziness, syncope..     Past Medical History  Diagnosis  Date  . Arrhythmia   . Chronic pain syndrome   . IBS (irritable bowel syndrome)   . Chronic constipation   . Chronic pelvic pain in female    Past Surgical History  Procedure Laterality Date  . Cesarean section    . Pelvic fracture surgery    . Tonsillectomy    . Tubal ligation    . Ovarian cyst removal Left   . Laparoscopic unilateral salpingo oopherectomy Left 10/11/2013    Procedure:  LAPAROSCOPIC LEFT SALPINGO OOPHORECTOMY;  Surgeon: [name], MD;  Location: [facility];  Service: Gynecology;  Laterality: Left;  . Bilateral salpingectomy Right 10/11/2013    Procedure: RIGHT SALPINGECTOMY;  Surgeon: [name], MD;  Location: [facility];  Service: Gynecology;  Laterality: Right;  . Diagnostic laparoscopy with removal of ectopic pregnancy N/A 10/15/2013    Procedure: DIAGNOSTIC LAPAROSCOPY ;  Surgeon: [name], MD;  Location: [facility];  Service: Gynecology;  Laterality: N/A;  . Cholecystectomy N/A 11/10/2013    Procedure: LAPAROSCOPIC CHOLECYSTECTOMY;  Surgeon: Scherry Ran, MD;  Location: [facility];  Service: General;  Laterality: N/A;  . Ercp N/A 11/18/2013    Procedure: ENDOSCOPIC RETROGRADE CHOLANGIOPANCREATOGRAPHY (ERCP) WITH SMALL SPHINCTEROTOMY;  Surgeon: Rogene Houston, MD;  Location: [facility];  Service: Endoscopy;  Laterality: N/A;  . Ercp N/A 01/13/2014    Procedure: ENDOSCOPIC RETROGRADE CHOLANGIOPANCREATOGRAPHY (ERCP);  Surgeon: Rogene Houston, MD;  Location: [facility];  Service: Endoscopy;  Laterality: N/A;  . Stent removal N/A 01/13/2014    Procedure: STENT REMOVAL;  Surgeon: Rogene Houston, MD;  Location: [facility];  Service: Endoscopy;  Laterality: N/A;  . Sphincterotomy N/A 01/13/2014    Procedure: SPHINCTEROTOMY;  Surgeon: Rogene Houston, MD;  Location: [facility];  Service: Endoscopy;  Laterality: N/A;   History reviewed. No pertinent family history. History  Substance Use Topics  . Smoking status: Current Every Day Smoker -- 1.00 packs/day for 2 years    Types:  Cigarettes  . Smokeless tobacco: Never Used  . Alcohol Use: Yes     Comment: socially   OB History   Grav Para Term Preterm Abortions TAB SAB Ect Mult Living                 Review of Systems  Constitutional: Negative for fever, diaphoresis, appetite change, fatigue and unexpected weight change.  HENT: Negative for mouth sores.   Eyes: Negative for visual disturbance.  Respiratory: Negative for cough, chest tightness, shortness of breath and wheezing.   Cardiovascular: Negative for chest pain.  Gastrointestinal: Negative for nausea, vomiting, abdominal pain, diarrhea and constipation.  Endocrine: Negative for polydipsia, polyphagia and polyuria.  Genitourinary: Negative for dysuria, urgency, frequency and hematuria.  Musculoskeletal: Negative for back pain and neck stiffness.  Skin: Positive for color change and wound. Negative for rash.  Allergic/Immunologic: Negative for immunocompromised state.  Neurological: Negative for syncope, light-headedness and headaches.  Hematological: Does not bruise/bleed easily.  Psychiatric/Behavioral: Negative for sleep disturbance. The patient is not nervous/anxious.       Allergies  Amoxicillin; Bee venom; Shellfish allergy; Vancomycin hcl; Iodine; Other; and Penicillins  Home Medications   Prior to Admission medications   Medication Sig Start Date End Date Taking? Authorizing Provider  clindamycin (CLEOCIN) 300 MG capsule Take 300 mg by mouth 3 (three) times daily.   Yes Historical Provider, MD  diclofenac sodium (VOLTAREN) 1 % GEL Apply 2 g topically daily as needed (for pain).   Yes Historical Provider, MD  ferrous sulfate 325 (65 FE) MG EC tablet Take 325 mg by mouth daily with breakfast.   Yes Historical Provider, MD  ibuprofen (ADVIL,MOTRIN) 800 MG tablet Take 800 mg by mouth every 6 (six) hours as needed for moderate pain.   Yes Historical Provider, MD  lidocaine (XYLOCAINE) 2 % jelly Apply 1 application topically as needed (for  wound).    Yes Historical Provider, MD  Linaclotide Rolan Lipa) 145 MCG CAPS capsule Take 1 capsule (145 mcg total) by mouth daily. 02/02/14  Yes Rogene Houston, MD  naproxen sodium (ANAPROX) 220 MG tablet Take 220 mg by mouth every 8 (eight) hours as needed (for pain).   Yes Historical Provider, MD  pantoprazole (PROTONIX) 40 MG tablet Take 1 tablet (40 mg total) by mouth at bedtime. 11/25/13  Yes [name], MD  retapamulin (ALTABAX) 1 % ointment Apply 1 application topically 3 (three) times daily.   Yes Historical Provider, MD  EPINEPHrine (EPIPEN) 0.3 mg/0.3 mL DEVI Inject 0.3 mg into the muscle once.    Historical Provider, MD   BP 101/59  Pulse 78  Temp(Src) 98.4 F (36.9 C) (Rectal)  Resp 16  SpO2 100%  LMP 04/08/2014 Physical Exam  Nursing note and vitals reviewed. Constitutional: She appears well-developed and well-nourished. No distress.  Awake, alert, nontoxic appearance  HENT:  Head: Normocephalic and atraumatic.  Mouth/Throat: Oropharynx is clear and moist. No oropharyngeal exudate.  Eyes: Conjunctivae are normal. No scleral icterus.  Neck: Normal range of motion. Neck supple.  Cardiovascular: Normal rate, regular rhythm, normal heart sounds and intact distal pulses.  No murmur heard. Pulmonary/Chest: Effort normal and breath sounds normal. No respiratory distress. She has no wheezes.  Equal chest expansion  Abdominal: Soft. Bowel sounds are normal. She exhibits no mass. There is no tenderness. There is no rebound and no guarding.  Musculoskeletal: Normal range of motion. She exhibits no edema.  Neurological: She is alert. She exhibits normal muscle tone. Coordination normal.  Speech is clear and goal oriented Moves extremities without ataxia  Skin: Skin is warm and dry. She is not diaphoretic. There is erythema.  Open wound to the left hip draining purulent fluid with surrounding induration  Psychiatric: She has a normal mood and affect.      EMERGENCY DEPARTMENT  US SOFT TISSUE INTERPRETATION "Study: Limited Ultrasound of the noted body part in comments below"  INDICATIONS: Pain and Soft tissue infection Multiple views of the body part are obtained with a multi-frequency linear probe  PERFORMED BY:  Myself  IMAGES ARCHIVED?: Yes  SIDE:Left  BODY PART:Abdominal wall  FINDINGS: Cellulitis present  LIMITATIONS:  Body Habitus  INTERPRETATION:  Cellulitis present  COMMENT:  Definite cellulitis with evidence of abscess, but unable to determine the depth of the abscess or it's path     ED Course  Procedures (including critical care time) Labs Review Labs Reviewed  CBC - Abnormal; Notable for the following:    RBC 3.42 (*)    Hemoglobin 9.0 (*)    HCT 28.0 (*)    RDW 15.6 (*)    Platelets 443 (*)    All other components within normal limits  URINALYSIS, ROUTINE W REFLEX MICROSCOPIC - Abnormal; Notable for the following:    APPearance CLOUDY (*)    Hgb urine dipstick LARGE (*)    All other components within normal limits  URINE MICROSCOPIC-ADD ON - Abnormal; Notable for the following:    Bacteria, UA FEW (*)    Crystals CA OXALATE CRYSTALS (*)    All other components within normal limits  I-STAT CG4 LACTIC ACID, ED - Abnormal; Notable for the following:    Lactic Acid, Venous <0.30 (*)    All other components within normal limits  I-STAT CG4 LACTIC ACID, ED - Abnormal; Notable for the following:    Lactic Acid, Venous 0.40 (*)    All other components within normal limits  WOUND CULTURE  CULTURE, BLOOD (ROUTINE X 2)  CULTURE, BLOOD (ROUTINE X 2)  BASIC METABOLIC PANEL  POC URINE PREG, ED    Imaging Review Ct Abdomen Pelvis W Contrast  04/22/2014   CLINICAL DATA:  Left hip abscess with associated drainage. Fever and chills.  EXAM: CT ABDOMEN AND PELVIS WITH CONTRAST  TECHNIQUE: Multidetector CT imaging of the abdomen and pelvis was performed using the standard protocol following bolus administration of intravenous contrast.   CONTRAST:  12mL OMNIPAQUE IOHEXOL 300 MG/ML  SOLN  COMPARISON:  MRI 02/28/2014; CT 11/20/2013  FINDINGS: Visualization of the lower thorax demonstrates no consolidative or nodular pulmonary opacities. Normal heart size.  Liver is normal in size and contour. Status post cholecystectomy. Pneumobilia. Portal vein is patent. Spleen, pancreas and bilateral adrenal glands are unremarkable. Kidneys enhance symmetrically with contrast.  Normal caliber abdominal aorta. No retroperitoneal lymphadenopathy. Uterus is unremarkable. Urinary bladder is unremarkable.  Stool throughout the colon. No abnormal bowel wall thickening or evidence bowel obstruction. The ascending colon is decompressed, limiting evaluation.  Stable postsurgical hardware within the right aspect of the pelvis. Stable patchy sclerosis with in right and left iliac wings. There is rim enhancing  low attenuation within the left gluteal musculature which courses cranially along the lateral margin of the left iliac wing and laterally where there is overlying skin thickening, likely at the site of clinically reported purulent drainage.  IMPRESSION: 1. Rim enhancing low attenuation within the left gluteal musculature extending cranially and laterally to the site of skin thickening which is likely the clinically reported site of purulent drainage. Much of this abnormality appears similar to prior MRI describing a high-grade myotendinous tear of the left gluteus medius muscle. Given the rim enhancement and clinical history, findings are concerning for superimposed infectious process. Depending on clinical treatment, consider further evaluation of the soft tissues with MRI.   Electronically Signed   By: Lovey Newcomer M.D.   On: 04/22/2014 21:39     EKG Interpretation None      MDM   Final diagnoses:  Abscess  History of pelvic fracture  Chronic pain syndrome   MADELEYN SCHWIMMER presents with 4 weeks of worsening abscess to the LLQ persistent after 5 days of  oral Clindamycin.  Concern for possible deep seated abscess after bedside evaluation with [place].  Will obtain CT.  10:33 PM CT with rim enhancing low attenuation within the left gluteal musculature which courses cranially along the lateral margin of the left iliac wing and laterally where there is overlying skin thickening. Concern for superimposed infectious process.  Will discuss with General surgery.    10:45 PM Discussed with Dr. Brantley Stage who will evaluate tonight, but requests medicine admission with IV abx.    11:29 PM Discussed with Dr. Sharol Given who will consult in the AM.  Also discussed with pharmacy recommends vancomycin be given with 50 mg of Benadryl IV prior to administration and Vancomycin run at half rate. Also recommend cefepime.    Discussed with Dr. Reece Levy who will admit.   The patient was discussed with and seen by Dr. Doy Mince who agrees with the treatment plan.  BP 101/59  Pulse 78  Temp(Src) 98.4 F (36.9 C) (Rectal)  Resp 16  SpO2 100%  LMP 04/08/2014    Abigail Butts, PA-C 04/23/14 Acacia Villas, PA-C 04/23/14 [id number]

## 2014-04-22 NOTE — ED Notes (Addendum)
Ran lactic acid to verify results 1st time <.30, 2nd time .40 results given to Dr Doy Mince

## 2014-04-22 NOTE — ED Notes (Signed)
Has abscess to left hip area for several days. Has moderate drainage from wound, reports fever and chills. Has already been taking po antibiotics.

## 2014-04-22 NOTE — ED Notes (Signed)
MD at bedside. 

## 2014-04-22 NOTE — Consult Note (Signed)
Reason for Consult:ascess left iliac crest ex fix pin site Referring Physician: [name] is an 28 y.o. female.  HPI: asked to see pt at request of Dr Loretha Stapler for chronic wound to skin over left iliac crest that has been painful and draining for 4 weeks.  Pt state she had ex fix placed 2003 at [facility] secondary to pelvic trauma.  Pt state that all of the old pin sites drain and improve.  This has been goin on for years.  Over the last 4 weeks pt left side pin site has drained more and she is having more pain over left buttock and lower back.  CT shows a sins tract from the skin into the left gluteal musculature with intramuscular abscess 1.5 cm.  She has hardware on the right.  Complains of increasing pain and malaise.   Past Medical History  Diagnosis Date  . Arrhythmia   . Chronic pain syndrome   . IBS (irritable bowel syndrome)   . Chronic constipation   . Chronic pelvic pain in female     Past Surgical History  Procedure Laterality Date  . Cesarean section    . Pelvic fracture surgery    . Tonsillectomy    . Tubal ligation    . Ovarian cyst removal Left   . Laparoscopic unilateral salpingo oopherectomy Left 10/11/2013    Procedure:  LAPAROSCOPIC LEFT SALPINGO OOPHORECTOMY;  Surgeon: Tilda Burrow, MD;  Location: [facility];  Service: Gynecology;  Laterality: Left;  . Bilateral salpingectomy Right 10/11/2013    Procedure: RIGHT SALPINGECTOMY;  Surgeon: Tilda Burrow, MD;  Location: [facility];  Service: Gynecology;  Laterality: Right;  . Diagnostic laparoscopy with removal of ectopic pregnancy N/A 10/15/2013    Procedure: DIAGNOSTIC LAPAROSCOPY ;  Surgeon: Tilda Burrow, MD;  Location: [facility];  Service: Gynecology;  Laterality: N/A;  . Cholecystectomy N/A 11/10/2013    Procedure: LAPAROSCOPIC CHOLECYSTECTOMY;  Surgeon: Marlane Hatcher, MD;  Location: [facility];  Service: General;  Laterality: N/A;  . Ercp N/A 11/18/2013    Procedure: ENDOSCOPIC RETROGRADE CHOLANGIOPANCREATOGRAPHY  (ERCP) WITH SMALL SPHINCTEROTOMY;  Surgeon: Malissa Hippo, MD;  Location: [facility];  Service: Endoscopy;  Laterality: N/A;  . Ercp N/A 01/13/2014    Procedure: ENDOSCOPIC RETROGRADE CHOLANGIOPANCREATOGRAPHY (ERCP);  Surgeon: Malissa Hippo, MD;  Location: [facility];  Service: Endoscopy;  Laterality: N/A;  . Stent removal N/A 01/13/2014    Procedure: STENT REMOVAL;  Surgeon: Malissa Hippo, MD;  Location: [facility];  Service: Endoscopy;  Laterality: N/A;  . Sphincterotomy N/A 01/13/2014    Procedure: SPHINCTEROTOMY;  Surgeon: Malissa Hippo, MD;  Location: [facility];  Service: Endoscopy;  Laterality: N/A;    History reviewed. No pertinent family history.  Social History:  reports that she has been smoking Cigarettes.  She has a 2 pack-year smoking history. She has never used smokeless tobacco. She reports that she drinks alcohol. She reports that she does not use illicit drugs.  Allergies:  Allergies  Allergen Reactions  . Amoxicillin Anaphylaxis, Hives and Rash    Able to take Ancef as prophylaxis without reaction  . Bee Venom Anaphylaxis  . Shellfish Allergy Anaphylaxis  . Vancomycin Hcl [Vancomycin] Itching  . Iodine Rash    Topical form only  . Other Rash    GRASS  . Penicillins Hives and Rash    Medications: I have reviewed the patient's current medications.  Results for orders placed during the hospital encounter of 04/22/14 (from  the past 48 hour(s))  CBC     Status: Abnormal   Collection Time    04/22/14  7:20 PM      Result Value Ref Range   WBC 9.8  4.0 - 10.5 K/uL   RBC 3.42 (*) 3.87 - 5.11 MIL/uL   Hemoglobin 9.0 (*) 12.0 - 15.0 g/dL   HCT 28.0 (*) 36.0 - 46.0 %   MCV 81.9  78.0 - 100.0 fL   MCH 26.3  26.0 - 34.0 pg   MCHC 32.1  30.0 - 36.0 g/dL   RDW 15.6 (*) 11.5 - 15.5 %   Platelets 443 (*) 150 - 400 K/uL  BASIC METABOLIC PANEL     Status: None   Collection Time    04/22/14  7:20 PM      Result Value Ref Range   Sodium 140  137 - 147 mEq/L   Potassium 4.0  3.7 -  5.3 mEq/L   Chloride 99  96 - 112 mEq/L   CO2 30  19 - 32 mEq/L   Glucose, Bld 81  70 - 99 mg/dL   BUN 19  6 - 23 mg/dL   Creatinine, Ser 0.67  0.50 - 1.10 mg/dL   Calcium 9.1  8.4 - 10.5 mg/dL   GFR calc non Af Amer >90  >90 mL/min   GFR calc Af Amer >90  >90 mL/min   Comment: (NOTE)     The eGFR has been calculated using the CKD EPI equation.     This calculation has not been validated in all clinical situations.     eGFR's persistently <90 mL/min signify possible Chronic Kidney     Disease.   Anion gap 11  5 - 15  I-STAT CG4 LACTIC ACID, ED     Status: Abnormal   Collection Time    04/22/14  7:26 PM      Result Value Ref Range   Lactic Acid, Venous <0.30 (*) 0.5 - 2.2 mmol/L  I-STAT CG4 LACTIC ACID, ED     Status: Abnormal   Collection Time    04/22/14  7:32 PM      Result Value Ref Range   Lactic Acid, Venous 0.40 (*) 0.5 - 2.2 mmol/L  URINALYSIS, ROUTINE W REFLEX MICROSCOPIC     Status: Abnormal   Collection Time    04/22/14  8:12 PM      Result Value Ref Range   Color, Urine YELLOW  YELLOW   APPearance CLOUDY (*) CLEAR   Specific Gravity, Urine 1.022  1.005 - 1.030   pH 6.0  5.0 - 8.0   Glucose, UA NEGATIVE  NEGATIVE mg/dL   Hgb urine dipstick LARGE (*) NEGATIVE   Bilirubin Urine NEGATIVE  NEGATIVE   Ketones, ur NEGATIVE  NEGATIVE mg/dL   Protein, ur NEGATIVE  NEGATIVE mg/dL   Urobilinogen, UA 0.2  0.0 - 1.0 mg/dL   Nitrite NEGATIVE  NEGATIVE   Leukocytes, UA NEGATIVE  NEGATIVE  URINE MICROSCOPIC-ADD ON     Status: Abnormal   Collection Time    04/22/14  8:12 PM      Result Value Ref Range   Squamous Epithelial / LPF RARE  RARE   RBC / HPF 21-50  <3 RBC/hpf   Bacteria, UA FEW (*) RARE   Crystals CA OXALATE CRYSTALS (*) NEGATIVE   Urine-Other MUCOUS PRESENT    POC URINE PREG, ED     Status: None   Collection Time    04/22/14  8:19 PM  Result Value Ref Range   Preg Test, Ur NEGATIVE  NEGATIVE   Comment:            THE SENSITIVITY OF THIS      METHODOLOGY IS >24 mIU/mL    Ct Abdomen Pelvis W Contrast  04/22/2014   CLINICAL DATA:  Left hip abscess with associated drainage. Fever and chills.  EXAM: CT ABDOMEN AND PELVIS WITH CONTRAST  TECHNIQUE: Multidetector CT imaging of the abdomen and pelvis was performed using the standard protocol following bolus administration of intravenous contrast.  CONTRAST:  159mL OMNIPAQUE IOHEXOL 300 MG/ML  SOLN  COMPARISON:  MRI 02/28/2014; CT 11/20/2013  FINDINGS: Visualization of the lower thorax demonstrates no consolidative or nodular pulmonary opacities. Normal heart size.  Liver is normal in size and contour. Status post cholecystectomy. Pneumobilia. Portal vein is patent. Spleen, pancreas and bilateral adrenal glands are unremarkable. Kidneys enhance symmetrically with contrast.  Normal caliber abdominal aorta. No retroperitoneal lymphadenopathy. Uterus is unremarkable. Urinary bladder is unremarkable.  Stool throughout the colon. No abnormal bowel wall thickening or evidence bowel obstruction. The ascending colon is decompressed, limiting evaluation.  Stable postsurgical hardware within the right aspect of the pelvis. Stable patchy sclerosis with in right and left iliac wings. There is rim enhancing low attenuation within the left gluteal musculature which courses cranially along the lateral margin of the left iliac wing and laterally where there is overlying skin thickening, likely at the site of clinically reported purulent drainage.  IMPRESSION: 1. Rim enhancing low attenuation within the left gluteal musculature extending cranially and laterally to the site of skin thickening which is likely the clinically reported site of purulent drainage. Much of this abnormality appears similar to prior MRI describing a high-grade myotendinous tear of the left gluteus medius muscle. Given the rim enhancement and clinical history, findings are concerning for superimposed infectious process. Depending on clinical treatment,  consider further evaluation of the soft tissues with MRI.   Electronically Signed   By: Lovey Newcomer M.D.   On: 04/22/2014 21:39    Review of Systems  Constitutional: Positive for fever and chills.  HENT: Negative.   Respiratory: Negative.   Cardiovascular: Negative.   Genitourinary: Negative.   Musculoskeletal: Positive for back pain and myalgias.  Skin: Negative.   Neurological: Negative.   Endo/Heme/Allergies: Negative.   Psychiatric/Behavioral: Negative.    Blood pressure 98/56, pulse 80, temperature 98.4 F (36.9 C), temperature source Rectal, resp. rate 16, last menstrual period 04/08/2014, SpO2 100.00%. Physical Exam  Constitutional: She appears well-developed and well-nourished. No distress.  HENT:  Head: Normocephalic.  Eyes: Pupils are equal, round, and reactive to light.  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  The tract on the left extends into deep left gluteal muscle.  This is beyond the scope of practice of general surgery.  Recommend ortho consult and discuss with medicine.    Siri Buege A. 04/22/2014, 10:57 PM

## 2014-04-22 NOTE — ED Provider Notes (Signed)
Medical screening examination/treatment/procedure(s) were conducted as a shared visit with non-physician practitioner(s) and myself.  I personally evaluated the patient during the encounter.   EKG Interpretation None      28 yo female with recurrent mild skin infections associated with a prior surgical site who presents with fever, swelling of skin overlying left hip, and purulent drainage.  On exam, well appearing, nontoxic, not distressed, normal respiratory effort, normal perfusion, left hip with large erythematous area with purulence.  CT shows concern for extension of infection.  Plan admit, IV abx, surgical consult.   Clinical Impression: 1. Abscess   2. History of pelvic fracture   3. Chronic pain syndrome       Houston Siren III, MD 04/22/14 2329

## 2014-04-23 ENCOUNTER — Encounter (HOSPITAL_COMMUNITY): Payer: Self-pay | Admitting: Internal Medicine

## 2014-04-23 DIAGNOSIS — G894 Chronic pain syndrome: Secondary | ICD-10-CM

## 2014-04-23 DIAGNOSIS — L0291 Cutaneous abscess, unspecified: Secondary | ICD-10-CM

## 2014-04-23 DIAGNOSIS — L0390 Cellulitis, unspecified: Secondary | ICD-10-CM

## 2014-04-23 DIAGNOSIS — D649 Anemia, unspecified: Secondary | ICD-10-CM

## 2014-04-23 DIAGNOSIS — D509 Iron deficiency anemia, unspecified: Secondary | ICD-10-CM | POA: Diagnosis present

## 2014-04-23 DIAGNOSIS — F19988 Other psychoactive substance use, unspecified with other psychoactive substance-induced disorder: Secondary | ICD-10-CM

## 2014-04-23 LAB — BASIC METABOLIC PANEL
Anion gap: 9 (ref 5–15)
BUN: 14 mg/dL (ref 6–23)
CALCIUM: 8.5 mg/dL (ref 8.4–10.5)
CO2: 29 meq/L (ref 19–32)
CREATININE: 0.73 mg/dL (ref 0.50–1.10)
Chloride: 103 mEq/L (ref 96–112)
GFR calc Af Amer: >90 mL/min (ref >90)
GFR calc non Af Amer: >90 mL/min (ref >90)
Glucose, Bld: 92 mg/dL (ref 70–99)
Potassium: 4.1 mEq/L (ref 3.7–5.3)
Sodium: 141 mEq/L (ref 137–147)

## 2014-04-23 LAB — IRON AND TIBC
Iron: 28 ug/dL — ABNORMAL LOW (ref 42–135)
SATURATION RATIOS: 12 % — AB (ref 20–55)
TIBC: 232 ug/dL — ABNORMAL LOW (ref 250–470)
UIBC: 204 ug/dL (ref 125–400)

## 2014-04-23 LAB — CBC
HEMATOCRIT: 27.4 % — AB (ref 36.0–46.0)
Hemoglobin: 8.8 g/dL — ABNORMAL LOW (ref 12.0–15.0)
MCH: 26.4 pg (ref 26.0–34.0)
MCHC: 32.1 g/dL (ref 30.0–36.0)
MCV: 82.3 fL (ref 78.0–100.0)
Platelets: 439 10*3/uL — ABNORMAL HIGH (ref 150–400)
RBC: 3.33 MIL/uL — ABNORMAL LOW (ref 3.87–5.11)
RDW: 15.7 % — AB (ref 11.5–15.5)
WBC: 9.9 10*3/uL (ref 4.0–10.5)

## 2014-04-23 LAB — VITAMIN B12: Vitamin B-12: 462 pg/mL (ref 211–911)

## 2014-04-23 LAB — RETICULOCYTES
RBC.: 3.33 MIL/uL — ABNORMAL LOW (ref 3.87–5.11)
RETIC COUNT ABSOLUTE: 40 10*3/uL (ref 19.0–186.0)
Retic Ct Pct: 1.2 % (ref 0.4–3.1)

## 2014-04-23 LAB — FERRITIN: FERRITIN: 57 ng/mL (ref 10–291)

## 2014-04-23 LAB — FOLATE: Folate: 10 ng/mL

## 2014-04-23 MED ORDER — INFLUENZA VAC SPLIT QUAD 0.5 ML IM SUSY
0.5000 mL | PREFILLED_SYRINGE | INTRAMUSCULAR | Status: AC
  Administered 2014-04-24: 0.5 mL via INTRAMUSCULAR
  Filled 2014-04-23: qty 0.5

## 2014-04-23 MED ORDER — LIDOCAINE HCL 2 % EX GEL
1.0000 "application " | Freq: Once | CUTANEOUS | Status: AC
  Administered 2014-04-23: 05:00:00 via TOPICAL
  Filled 2014-04-23: qty 5

## 2014-04-23 MED ORDER — NICOTINE 21 MG/24HR TD PT24
21.0000 mg | MEDICATED_PATCH | Freq: Every day | TRANSDERMAL | Status: DC
  Administered 2014-04-24: 21 mg via TRANSDERMAL
  Filled 2014-04-23: qty 1

## 2014-04-23 MED ORDER — LIDOCAINE 5 % EX OINT
TOPICAL_OINTMENT | Freq: Four times a day (QID) | CUTANEOUS | Status: DC | PRN
  Filled 2014-04-23: qty 35.44

## 2014-04-23 MED ORDER — LINACLOTIDE 145 MCG PO CAPS
145.0000 ug | ORAL_CAPSULE | Freq: Every day | ORAL | Status: DC
  Administered 2014-04-23 – 2014-04-24 (×2): 145 ug via ORAL
  Filled 2014-04-23 (×2): qty 1

## 2014-04-23 MED ORDER — ACETAMINOPHEN 650 MG RE SUPP: 650.0000 mg | Freq: Four times a day (QID) | RECTAL | Status: DC | PRN

## 2014-04-23 MED ORDER — NICOTINE 14 MG/24HR TD PT24
14.0000 mg | MEDICATED_PATCH | Freq: Every day | TRANSDERMAL | Status: DC
  Administered 2014-04-23: 14 mg via TRANSDERMAL
  Filled 2014-04-23: qty 1

## 2014-04-23 MED ORDER — POLYETHYLENE GLYCOL 3350 17 G PO PACK: 17.0000 g | PACK | Freq: Every day | ORAL | Status: DC | PRN

## 2014-04-23 MED ORDER — PANTOPRAZOLE SODIUM 40 MG PO TBEC
40.0000 mg | DELAYED_RELEASE_TABLET | Freq: Every day | ORAL | Status: DC
  Administered 2014-04-23 (×2): 40 mg via ORAL
  Filled 2014-04-23 (×2): qty 1

## 2014-04-23 MED ORDER — HYDROMORPHONE HCL 1 MG/ML IJ SOLN
2.0000 mg | INTRAMUSCULAR | Status: DC | PRN
  Administered 2014-04-23 (×2): 2 mg via INTRAVENOUS
  Filled 2014-04-23 (×3): qty 2

## 2014-04-23 MED ORDER — DEXTROSE 5 % IV SOLN
1.0000 g | Freq: Three times a day (TID) | INTRAVENOUS | Status: DC
  Administered 2014-04-23 – 2014-04-24 (×5): 1 g via INTRAVENOUS
  Filled 2014-04-23 (×7): qty 1

## 2014-04-23 MED ORDER — HYDROMORPHONE HCL 1 MG/ML IJ SOLN: 1.0000 mg | INTRAMUSCULAR | Status: DC | PRN

## 2014-04-23 MED ORDER — ACETAMINOPHEN 325 MG PO TABS
650.0000 mg | ORAL_TABLET | Freq: Four times a day (QID) | ORAL | Status: DC | PRN
  Administered 2014-04-23: 650 mg via ORAL
  Filled 2014-04-23: qty 2

## 2014-04-23 MED ORDER — DIPHENHYDRAMINE HCL 50 MG/ML IJ SOLN
25.0000 mg | Freq: Three times a day (TID) | INTRAMUSCULAR | Status: DC
  Administered 2014-04-23 – 2014-04-24 (×4): 25 mg via INTRAVENOUS
  Filled 2014-04-23: qty 0.5
  Filled 2014-04-23 (×2): qty 1
  Filled 2014-04-23 (×4): qty 0.5
  Filled 2014-04-23 (×2): qty 1
  Filled 2014-04-23: qty 0.5

## 2014-04-23 MED ORDER — FERROUS SULFATE 325 (65 FE) MG PO TABS
325.0000 mg | ORAL_TABLET | Freq: Every day | ORAL | Status: DC
  Administered 2014-04-23 – 2014-04-24 (×2): 325 mg via ORAL
  Filled 2014-04-23 (×3): qty 1

## 2014-04-23 MED ORDER — HYDROMORPHONE HCL 1 MG/ML IJ SOLN
2.0000 mg | INTRAMUSCULAR | Status: DC | PRN
  Administered 2014-04-23 – 2014-04-24 (×9): 2 mg via INTRAVENOUS
  Filled 2014-04-23 (×9): qty 2

## 2014-04-23 MED ORDER — ONDANSETRON HCL 4 MG/2ML IJ SOLN: 4.0000 mg | Freq: Four times a day (QID) | INTRAMUSCULAR | Status: DC | PRN

## 2014-04-23 MED ORDER — SODIUM CHLORIDE 0.9 % IV SOLN: INTRAVENOUS | Status: AC

## 2014-04-23 MED ORDER — VANCOMYCIN HCL IN DEXTROSE 750-5 MG/150ML-% IV SOLN
750.0000 mg | Freq: Three times a day (TID) | INTRAVENOUS | Status: DC
  Administered 2014-04-23 – 2014-04-24 (×5): 750 mg via INTRAVENOUS
  Filled 2014-04-23 (×7): qty 150

## 2014-04-23 MED ORDER — ENOXAPARIN SODIUM 40 MG/0.4ML ~~LOC~~ SOLN
40.0000 mg | SUBCUTANEOUS | Status: DC
  Administered 2014-04-24: 40 mg via SUBCUTANEOUS
  Filled 2014-04-23 (×2): qty 0.4

## 2014-04-23 MED ORDER — ONDANSETRON HCL 4 MG PO TABS: 4.0000 mg | ORAL_TABLET | Freq: Four times a day (QID) | ORAL | Status: DC | PRN

## 2014-04-23 NOTE — H&P (Signed)
Patient's PCP: [name], MD Patient's orthopedic physician: Dr. Erlinda Hong  Chief Complaint: Left hip pain, swelling, and abscess  History of Present Illness: Catherine Ramirez is a 28 y.o. Caucasian female with history of chronic pain syndrome, irritable bowel syndrome, chronic constipation, and chronic pelvic pain from pelvic fracture status post repair who presents with the above complaints.  Patient 2003 has had surgery to her pelvis from fracture and has had hardware in place.  Since that time, patient has had multiple episodes of redness over her bilateral pelvis with episodes of skin breakdown which eventually would heal up on its own.  However in August of 2015 she noted redness and induration over her left hip where she had old pin inserted.  She also had the redness spread posteriorly to her buttock.  On 04/20/2014 her skin broke down over her anterior iliac crest and was starting to drain.  Since then her induration has improved.  She continued to have oozing of pus and serosanguineous fluid.  She noted that on 04/18/2014 she had a temperature of 101.2 and subsequently has been having low-grade fevers at home.  She has been taking ibuprofen at home.  She denies any nausea, vomiting, chest pain, shortness of breath, abdominal pain, headaches or vision changes.  She indicates that she has chronic diarrhea which is at her baseline.  She started taking clindamycin on 04/16/2014 which was left over from a previous prescription.  Review of Systems: All systems reviewed with the patient and positive as per history of present illness, otherwise all other systems are negative.  Past Medical History  Diagnosis Date  . Arrhythmia   . Chronic pain syndrome   . IBS (irritable bowel syndrome)   . Chronic constipation   . Chronic pelvic pain in female    Past Surgical History  Procedure Laterality Date  . Cesarean section    . Pelvic fracture surgery    . Tonsillectomy    . Tubal ligation    .  Ovarian cyst removal Left   . Laparoscopic unilateral salpingo oopherectomy Left 10/11/2013    Procedure:  LAPAROSCOPIC LEFT SALPINGO OOPHORECTOMY;  Surgeon: [name], MD;  Location: [facility];  Service: Gynecology;  Laterality: Left;  . Bilateral salpingectomy Right 10/11/2013    Procedure: RIGHT SALPINGECTOMY;  Surgeon: [name], MD;  Location: [facility];  Service: Gynecology;  Laterality: Right;  . Diagnostic laparoscopy with removal of ectopic pregnancy N/A 10/15/2013    Procedure: DIAGNOSTIC LAPAROSCOPY ;  Surgeon: [name], MD;  Location: [facility];  Service: Gynecology;  Laterality: N/A;  . Cholecystectomy N/A 11/10/2013    Procedure: LAPAROSCOPIC CHOLECYSTECTOMY;  Surgeon: Scherry Ran, MD;  Location: [facility];  Service: General;  Laterality: N/A;  . Ercp N/A 11/18/2013    Procedure: ENDOSCOPIC RETROGRADE CHOLANGIOPANCREATOGRAPHY (ERCP) WITH SMALL SPHINCTEROTOMY;  Surgeon: Rogene Houston, MD;  Location: [facility];  Service: Endoscopy;  Laterality: N/A;  . Ercp N/A 01/13/2014    Procedure: ENDOSCOPIC RETROGRADE CHOLANGIOPANCREATOGRAPHY (ERCP);  Surgeon: Rogene Houston, MD;  Location: [facility];  Service: Endoscopy;  Laterality: N/A;  . Stent removal N/A 01/13/2014    Procedure: STENT REMOVAL;  Surgeon: Rogene Houston, MD;  Location: [facility];  Service: Endoscopy;  Laterality: N/A;  . Sphincterotomy N/A 01/13/2014    Procedure: SPHINCTEROTOMY;  Surgeon: Rogene Houston, MD;  Location: [facility];  Service: Endoscopy;  Laterality: N/A;   Family History  Problem Relation Age of Onset  . Hypertension Mother  History   Social History  . Marital Status: Married    Spouse Name: N/A    Number of Children: N/A  . Years of Education: N/A   Occupational History  . Not on file.   Social History Main Topics  . Smoking status: Current Every Day Smoker -- 0.50 packs/day for 2 years    Types: Cigarettes  . Smokeless tobacco: Never Used  . Alcohol Use: Yes     Comment: socially  . Drug  Use: No  . Sexual Activity: Yes    Birth Control/ Protection: None   Other Topics Concern  . Not on file   Social History Narrative  . No narrative on file   Allergies: Amoxicillin; Bee venom; Shellfish allergy; Vancomycin hcl; Iodine; Other; and Penicillins  Home Meds: Prior to Admission medications   Medication Sig Start Date End Date Taking? Authorizing Provider  clindamycin (CLEOCIN) 300 MG capsule Take 300 mg by mouth 3 (three) times daily.   Yes Historical Provider, MD  diclofenac sodium (VOLTAREN) 1 % GEL Apply 2 g topically daily as needed (for pain).   Yes Historical Provider, MD  ferrous sulfate 325 (65 FE) MG EC tablet Take 325 mg by mouth daily with breakfast.   Yes Historical Provider, MD  ibuprofen (ADVIL,MOTRIN) 800 MG tablet Take 800 mg by mouth every 6 (six) hours as needed for moderate pain.   Yes Historical Provider, MD  lidocaine (XYLOCAINE) 2 % jelly Apply 1 application topically as needed (for wound).    Yes Historical Provider, MD  Linaclotide Rolan Lipa) 145 MCG CAPS capsule Take 1 capsule (145 mcg total) by mouth daily. 02/02/14  Yes Rogene Houston, MD  naproxen sodium (ANAPROX) 220 MG tablet Take 220 mg by mouth every 8 (eight) hours as needed (for pain).   Yes Historical Provider, MD  pantoprazole (PROTONIX) 40 MG tablet Take 1 tablet (40 mg total) by mouth at bedtime. 11/25/13  Yes [name], MD  retapamulin (ALTABAX) 1 % ointment Apply 1 application topically 3 (three) times daily.   Yes Historical Provider, MD  EPINEPHrine (EPIPEN) 0.3 mg/0.3 mL DEVI Inject 0.3 mg into the muscle once.    Historical Provider, MD    Physical Exam: Blood pressure 101/59, pulse 78, temperature 98.4 F (36.9 C), temperature source Rectal, resp. rate 16, last menstrual period 04/08/2014, SpO2 100.00%. General: Awake, Oriented x3, No acute distress. HEENT: EOMI, Moist mucous membranes Neck: Supple CV: S1 and S2 Lungs: Clear to ascultation bilaterally Abdomen: Soft,  Nontender, Nondistended, +bowel sounds.  Right anterior hip induration with some redness some breakdown of skin.  Left hip ulcerated lesion over anterior hip oozing pus with tracking of pain from anterior around to posteriorly over her buttock on the left. Ext: Good pulses. Trace edema. No clubbing or cyanosis noted. Neuro: Cranial Nerves II-XII grossly intact. Has 5/5 motor strength in upper and lower extremities.  Lab results:  Recent Labs  04/22/14 1920  NA 140  K 4.0  CL 99  CO2 30  GLUCOSE 81  BUN 19  CREATININE 0.67  CALCIUM 9.1   No results found for this basename: AST, ALT, ALKPHOS, BILITOT, PROT, ALBUMIN,  in the last 72 hours No results found for this basename: LIPASE, AMYLASE,  in the last 72 hours  Recent Labs  04/22/14 1920  WBC 9.8  HGB 9.0*  HCT 28.0*  MCV 81.9  PLT 443*   No results found for this basename: CKTOTAL, CKMB, CKMBINDEX, TROPONINI,  in the last 72 hours  No components found with this basename: POCBNP,  No results found for this basename: DDIMER,  in the last 72 hours No results found for this basename: HGBA1C,  in the last 72 hours No results found for this basename: CHOL, HDL, LDLCALC, TRIG, CHOLHDL, LDLDIRECT,  in the last 72 hours No results found for this basename: TSH, T4TOTAL, FREET3, T3FREE, THYROIDAB,  in the last 72 hours No results found for this basename: VITAMINB12, FOLATE, FERRITIN, TIBC, IRON, RETICCTPCT,  in the last 72 hours Imaging results:  Ct Abdomen Pelvis W Contrast  04/22/2014   CLINICAL DATA:  Left hip abscess with associated drainage. Fever and chills.  EXAM: CT ABDOMEN AND PELVIS WITH CONTRAST  TECHNIQUE: Multidetector CT imaging of the abdomen and pelvis was performed using the standard protocol following bolus administration of intravenous contrast.  CONTRAST:  159mL OMNIPAQUE IOHEXOL 300 MG/ML  SOLN  COMPARISON:  MRI 02/28/2014; CT 11/20/2013  FINDINGS: Visualization of the lower thorax demonstrates no consolidative or  nodular pulmonary opacities. Normal heart size.  Liver is normal in size and contour. Status post cholecystectomy. Pneumobilia. Portal vein is patent. Spleen, pancreas and bilateral adrenal glands are unremarkable. Kidneys enhance symmetrically with contrast.  Normal caliber abdominal aorta. No retroperitoneal lymphadenopathy. Uterus is unremarkable. Urinary bladder is unremarkable.  Stool throughout the colon. No abnormal bowel wall thickening or evidence bowel obstruction. The ascending colon is decompressed, limiting evaluation.  Stable postsurgical hardware within the right aspect of the pelvis. Stable patchy sclerosis with in right and left iliac wings. There is rim enhancing low attenuation within the left gluteal musculature which courses cranially along the lateral margin of the left iliac wing and laterally where there is overlying skin thickening, likely at the site of clinically reported purulent drainage.  IMPRESSION: 1. Rim enhancing low attenuation within the left gluteal musculature extending cranially and laterally to the site of skin thickening which is likely the clinically reported site of purulent drainage. Much of this abnormality appears similar to prior MRI describing a high-grade myotendinous tear of the left gluteus medius muscle. Given the rim enhancement and clinical history, findings are concerning for superimposed infectious process. Depending on clinical treatment, consider further evaluation of the soft tissues with MRI.   Electronically Signed   By: Lovey Newcomer M.D.   On: 04/22/2014 21:39   Assessment & Plan by Problem: Cellulitis and abscess over left anterior hip Started the patient on vancomycin and cefepime.  Patient has hardware in her pelvis.  General surgery was consulted but deferred further management to orthopedic service.  Dr. Sharol Given from orthopedic service was consulted.  CT of her pelvis shows rim enhancing lesion within the left gluteal musculature.  Continue wound  care.  Anticipate need for an I&D.  Chronic pain syndrome Continue Dilaudid for abdominal pain.  Tobacco use Tonsils on cessation.  Patient motivated.  Nicotine patch prescribed.  Anemia Likely due to chronic disease and iron deficiency anemia.  Send anemia panel in the morning.  Continue supplemental iron.  Prophylaxis Lovenox.  CODE STATUS Full code.  Disposition Admit the patient to medical bed as inpatient.  Time spent on admission, talking to the patient, and coordinating care was: 50 mins.  Karem Tomaso A, MD 04/23/2014, 12:58 AM

## 2014-04-23 NOTE — Progress Notes (Signed)
ANTIBIOTIC CONSULT NOTE - INITIAL  Pharmacy Consult for Vancomycin and Cefepime  Indication: cellulitis  Allergies  Allergen Reactions  . Amoxicillin Anaphylaxis, Hives and Rash    Able to take Ancef as prophylaxis without reaction  . Bee Venom Anaphylaxis  . Shellfish Allergy Anaphylaxis  . Vancomycin Hcl [Vancomycin] Itching  . Iodine Rash    Topical form only  . Other Rash    GRASS  . Penicillins Hives and Rash    Patient Measurements: Height: 5\' 5"  (165.1 cm) Weight: 125 lb 10.6 oz (57 kg) IBW/kg (Calculated) : 57  Vital Signs: Temp: 98.9 F (37.2 C) (09/20 0107) Temp src: Oral (09/20 0107) BP: 103/56 mmHg (09/20 0107) Pulse Rate: 80 (09/20 0107) Intake/Output from previous day:   Intake/Output from this shift:    Labs:  Recent Labs  04/22/14 1920  WBC 9.8  HGB 9.0*  PLT 443*  CREATININE 0.67   Estimated Creatinine Clearance: 94.2 ml/min (by C-G formula based on Cr of 0.67). No results found for this basename: VANCOTROUGH, VANCOPEAK, VANCORANDOM, GENTTROUGH, GENTPEAK, GENTRANDOM, TOBRATROUGH, TOBRAPEAK, TOBRARND, AMIKACINPEAK, AMIKACINTROU, AMIKACIN,  in the last 72 hours   Microbiology: No results found for this or any previous visit (from the past 720 hour(s)).  Medical History: Past Medical History  Diagnosis Date  . Arrhythmia   . Chronic pain syndrome   . IBS (irritable bowel syndrome)   . Chronic constipation   . Chronic pelvic pain in female     Medications:  Prescriptions prior to admission  Medication Sig Dispense Refill  . clindamycin (CLEOCIN) 300 MG capsule Take 300 mg by mouth 3 (three) times daily.      . diclofenac sodium (VOLTAREN) 1 % GEL Apply 2 g topically daily as needed (for pain).      . ferrous sulfate 325 (65 FE) MG EC tablet Take 325 mg by mouth daily with breakfast.      . ibuprofen (ADVIL,MOTRIN) 800 MG tablet Take 800 mg by mouth every 6 (six) hours as needed for moderate pain.      Marland Kitchen lidocaine (XYLOCAINE) 2 % jelly  Apply 1 application topically as needed (for wound).       . Linaclotide (LINZESS) 145 MCG CAPS capsule Take 1 capsule (145 mcg total) by mouth daily.  30 capsule  5  . naproxen sodium (ANAPROX) 220 MG tablet Take 220 mg by mouth every 8 (eight) hours as needed (for pain).      . pantoprazole (PROTONIX) 40 MG tablet Take 1 tablet (40 mg total) by mouth at bedtime.  30 tablet  0  . retapamulin (ALTABAX) 1 % ointment Apply 1 application topically 3 (three) times daily.      Marland Kitchen EPINEPHrine (EPIPEN) 0.3 mg/0.3 mL DEVI Inject 0.3 mg into the muscle once.       Assessment: 28 yo female with left hip abcess for empiric antibiotics.  Vancomycin 1 g IV given in ED at 0100  Goal of Therapy:  Vancomycin trough level 15-20 mcg/ml  Plan:  Vancomycin 750 mg IV q8h--will infuse at 1/2 normal rate and premedicate with 25 mg IV Benadryl due to previous reactions Cefepime 2 g IV now, then 1 g IV q8h  Carrson Lightcap, Bronson Curb 04/23/2014,1:14 AM

## 2014-04-23 NOTE — Progress Notes (Signed)
[facility] PROGRESS NOTE  Catherine Ramirez:[id number] DOB: 1985/12/30 DOA: 04/22/2014 PCP: [name], MD  Brief noted 29 year old female with chronic pain syndrome, chronic pelvic pain from pelvic fracture in 2003 status post repair with a hardware placed at [facility] presented with worsening left hip pain, swelling and draining abscess.   Assessment/Plan: Cellulitis with abscess over her left anterior hip On empiric IV vancomycin and cefepime. Patient saw Dr Erlinda Hong about 2 months back pain in her left buttocks an MI at that time showed inflammation of the gluteus muscles without any ulcers or abscess. She has pictures of the left hip area where she started having erythema and draining from the pin track about 3 weeks back and has been progressive. -Follow blood culture. -Seen by orthopedics Dr. Sharol Given who plans to discuss her case with Dr Marcelino Scot regarding extensive debridement of the abscess. Recommends she may need to be transferred to [facility]. We'll await further recommendations. Pain control with when necessary IV Dilaudid.  Chronic abdominal pain syndrome Continue Dilaudid  Tobacco use Counseled on cessation. Continue nicotine patch  Anemia In panel suggest iron deficiency. Continue iron supplements   Code Status: Full code Family Communication: Spouse at bedside Disposition Plan: Continue inpatient   Consultants:  Dr. Sharol Given  Procedures:  none  Antibiotics:  IV vancomycin and cefepime (9/20-)  HPI/Subjective: Admission H&P reviewed. Patient reports pain over her left hip with discharge.  Objective: Filed Vitals:   04/23/14 1444  BP: 89/43  Pulse: 60  Temp: 98.2 F (36.8 C)  Resp: 16    Intake/Output Summary (Last 24 hours) at 04/23/14 1635 Last data filed at 04/23/14 0300  Gross per 24 hour  Intake  362.5 ml  Output      0 ml  Net  362.5 ml   Filed Weights   04/23/14 0107  Weight: 57 kg (125 lb 10.6 oz)     Exam:   General:  Young female in no acute distress  HEENT: No pallor, moist oral mucosa  Chest: Clear to auscultation bilaterally  CVS: Normal S1-S2, no murmurs  Abdomen: Soft, nontender, nondistended, bowel sounds present, left pelvic erythema with draining area of abscess with tenderness extending to medial grown and left gluteus  Extremities: Warm, no edema  CNS: Alert and oriented   Data Reviewed: Basic Metabolic Panel:  Recent Labs Lab 04/22/14 1920 04/23/14 0437  NA 140 141  K 4.0 4.1  CL 99 103  CO2 30 29  GLUCOSE 81 92  BUN 19 14  CREATININE 0.67 0.73  CALCIUM 9.1 8.5   Liver Function Tests: No results found for this basename: AST, ALT, ALKPHOS, BILITOT, PROT, ALBUMIN,  in the last 168 hours No results found for this basename: LIPASE, AMYLASE,  in the last 168 hours No results found for this basename: AMMONIA,  in the last 168 hours CBC:  Recent Labs Lab 04/22/14 1920 04/23/14 0437  WBC 9.8 9.9  HGB 9.0* 8.8*  HCT 28.0* 27.4*  MCV 81.9 82.3  PLT 443* 439*   Cardiac Enzymes: No results found for this basename: CKTOTAL, CKMB, CKMBINDEX, TROPONINI,  in the last 168 hours BNP (last 3 results) No results found for this basename: PROBNP,  in the last 8760 hours CBG: No results found for this basename: GLUCAP,  in the last 168 hours  No results found for this or any previous visit (from the past 240 hour(s)).   Studies: Ct Abdomen Pelvis W Contrast  04/22/2014   CLINICAL DATA:  Left hip abscess with associated drainage. Fever and chills.  EXAM: CT ABDOMEN AND PELVIS WITH CONTRAST  TECHNIQUE: Multidetector CT imaging of the abdomen and pelvis was performed using the standard protocol following bolus administration of intravenous contrast.  CONTRAST:  12mL OMNIPAQUE IOHEXOL 300 MG/ML  SOLN  COMPARISON:  MRI 02/28/2014; CT 11/20/2013  FINDINGS: Visualization of the lower thorax demonstrates no consolidative or nodular pulmonary opacities. Normal heart  size.  Liver is normal in size and contour. Status post cholecystectomy. Pneumobilia. Portal vein is patent. Spleen, pancreas and bilateral adrenal glands are unremarkable. Kidneys enhance symmetrically with contrast.  Normal caliber abdominal aorta. No retroperitoneal lymphadenopathy. Uterus is unremarkable. Urinary bladder is unremarkable.  Stool throughout the colon. No abnormal bowel wall thickening or evidence bowel obstruction. The ascending colon is decompressed, limiting evaluation.  Stable postsurgical hardware within the right aspect of the pelvis. Stable patchy sclerosis with in right and left iliac wings. There is rim enhancing low attenuation within the left gluteal musculature which courses cranially along the lateral margin of the left iliac wing and laterally where there is overlying skin thickening, likely at the site of clinically reported purulent drainage.  IMPRESSION: 1. Rim enhancing low attenuation within the left gluteal musculature extending cranially and laterally to the site of skin thickening which is likely the clinically reported site of purulent drainage. Much of this abnormality appears similar to prior MRI describing a high-grade myotendinous tear of the left gluteus medius muscle. Given the rim enhancement and clinical history, findings are concerning for superimposed infectious process. Depending on clinical treatment, consider further evaluation of the soft tissues with MRI.   Electronically Signed   By: Lovey Newcomer M.D.   On: 04/22/2014 21:39    Scheduled Meds: . ceFEPime (MAXIPIME) IV  1 g Intravenous Q8H  . diphenhydrAMINE  25 mg Intravenous Q8H  . enoxaparin (LOVENOX) injection  40 mg Subcutaneous Q24H  . ferrous sulfate  325 mg Oral Q breakfast  . [START ON 04/24/2014] Influenza vac split quadrivalent PF  0.5 mL Intramuscular Tomorrow-1000  . Linaclotide  145 mcg Oral Daily  . nicotine  14 mg Transdermal Daily  . pantoprazole  40 mg Oral QHS  . vancomycin  750 mg  Intravenous Q8H   Continuous Infusions: . sodium chloride         Time spent: 20 minutes    Azarias Chiou, [place]  [facility] Pager [phone]. If 7PM-7AM, please contact night-coverage at [email or web address], password [facility] 04/23/2014, 4:35 PM  LOS: 1 day

## 2014-04-23 NOTE — ED Notes (Signed)
Transporting patient to news room assignment.  

## 2014-04-23 NOTE — ED Provider Notes (Signed)
Medical screening examination/treatment/procedure(s) were conducted as a shared visit with non-physician practitioner(s) and myself.  I personally evaluated the patient during the encounter.   EKG Interpretation None        Houston Siren III, MD 04/23/14 1534
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
## 2014-04-24 MED ORDER — SODIUM CHLORIDE 0.9 % IV SOLN: INTRAVENOUS | Status: DC

## 2014-04-24 MED ORDER — VANCOMYCIN HCL IN DEXTROSE 750-5 MG/150ML-% IV SOLN: 750.0000 mg | Freq: Three times a day (TID) | INTRAVENOUS | Status: DC

## 2014-04-24 MED ORDER — POLYETHYLENE GLYCOL 3350 17 G PO PACK: 17.0000 g | PACK | Freq: Every day | ORAL | Status: DC | PRN

## 2014-04-24 MED ORDER — DIPHENHYDRAMINE HCL 50 MG/ML IJ SOLN
50.0000 mg | Freq: Three times a day (TID) | INTRAMUSCULAR | Status: DC
  Administered 2014-04-24: 25 mg via INTRAVENOUS
  Administered 2014-04-24: 50 mg via INTRAVENOUS
  Filled 2014-04-24 (×2): qty 1

## 2014-04-24 MED ORDER — SODIUM CHLORIDE 0.9 % IV BOLUS (SEPSIS)
500.0000 mL | Freq: Once | INTRAVENOUS | Status: AC
  Administered 2014-04-24: 500 mL via INTRAVENOUS

## 2014-04-24 MED ORDER — DEXTROSE 5 % IV SOLN: 1.0000 g | Freq: Three times a day (TID) | INTRAVENOUS | Status: DC

## 2014-04-24 NOTE — Progress Notes (Signed)
Imogene Burn. Georgette Dover, MD, [facility] Surgery  General/ Trauma Surgery  04/24/2014 11:34 AM

## 2014-04-24 NOTE — Progress Notes (Signed)
As stated in previous note, [facility] will not be providing transport. Transport set up and will be provided via [facility]. Patient aware of transfer and will await arrival of transport.

## 2014-04-24 NOTE — Progress Notes (Signed)
Report called to [place] with RN Jenna Luo. Pt going to Rm [id number] in [facility]. Will be transported via [facility]. [facility] called at this time to set up transport. Will discharge upon arrival

## 2014-04-24 NOTE — Consult Note (Addendum)
WOC consult requested prior to ortho service involvement.  Ortho service now following for left hip; refer to Dr Jess Barters progress notes for assessment and plan of care. Please re-consult if further assistance is needed.  Thank-you,  Julien Girt MSN, [place], [place], [place], [place]

## 2014-04-24 NOTE — Progress Notes (Signed)
See that the patient is going to be transferred back to [facility] for treatment of chronic osteomyelitis left iliac wing.  Ortho / IM facilitating transfer.  We have nothing else to offer, but agree with transfer to higher level of care.  Coralie Keens, [facility] Surgery General Surgery
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
## 2014-04-24 NOTE — Progress Notes (Addendum)
Patient ID: Catherine Ramirez, female   DOB: 19-Jan-1986, 28 y.o.   MRN: [id number] I discussed patient's case with Dr. Marcelino Scot. He feels that it would be best to transfer the patient back to [facility] for definitive treatment of the chronic osteomyelitis left iliac wing. Please contact me if I can be of assistance in facilitating transfer to [facility]  I had a discussion with Dr Graciela Husbands at [facility] this AM, who stated to send the patient to the [facility] ER for evaluation and treatment.  He did not feel a direct admission was necessary, he did not want the patient admitted to his service.  I repeated that the patient had draining purulence from the pelvis pin tracts, and CT scan showed abscess in the gluteal muscles.  He felt a transfer to the ER was appropriate.

## 2014-04-26 LAB — WOUND CULTURE: Gram Stain: NONE SEEN

## 2014-04-29 LAB — CULTURE, BLOOD (ROUTINE X 2)
CULTURE: NO GROWTH
Culture: NO GROWTH

## 2014-05-31 ENCOUNTER — Emergency Department (HOSPITAL_COMMUNITY)
Admission: EM | Admit: 2014-05-31 | Discharge: 2014-05-31 | Disposition: A | Discharge status: Other Acute Inpatient Hospital | Payer: Medicaid Other | Attending: Emergency Medicine | Admitting: Emergency Medicine

## 2014-05-31 DIAGNOSIS — Z791 Long term (current) use of non-steroidal anti-inflammatories (NSAID): Secondary | ICD-10-CM | POA: Insufficient documentation

## 2014-05-31 DIAGNOSIS — Z79899 Other long term (current) drug therapy: Secondary | ICD-10-CM | POA: Diagnosis not present

## 2014-05-31 DIAGNOSIS — M86652 Other chronic osteomyelitis, left thigh: Secondary | ICD-10-CM | POA: Diagnosis not present

## 2014-05-31 DIAGNOSIS — Y838 Other surgical procedures as the cause of abnormal reaction of the patient, or of later complication, without mention of misadventure at the time of the procedure: Secondary | ICD-10-CM | POA: Diagnosis not present

## 2014-05-31 DIAGNOSIS — Z880 Allergy status to penicillin: Secondary | ICD-10-CM | POA: Insufficient documentation

## 2014-05-31 DIAGNOSIS — T8189XA Other complications of procedures, not elsewhere classified, initial encounter: Secondary | ICD-10-CM | POA: Insufficient documentation

## 2014-05-31 DIAGNOSIS — Z8679 Personal history of other diseases of the circulatory system: Secondary | ICD-10-CM | POA: Diagnosis not present

## 2014-05-31 DIAGNOSIS — R51 Headache: Secondary | ICD-10-CM | POA: Insufficient documentation

## 2014-05-31 DIAGNOSIS — M8660 Other chronic osteomyelitis, unspecified site: Secondary | ICD-10-CM

## 2014-05-31 DIAGNOSIS — Z792 Long term (current) use of antibiotics: Secondary | ICD-10-CM | POA: Diagnosis not present

## 2014-05-31 DIAGNOSIS — G894 Chronic pain syndrome: Secondary | ICD-10-CM | POA: Insufficient documentation

## 2014-05-31 DIAGNOSIS — Z8742 Personal history of other diseases of the female genital tract: Secondary | ICD-10-CM | POA: Insufficient documentation

## 2014-05-31 DIAGNOSIS — M25451 Effusion, right hip: Secondary | ICD-10-CM | POA: Diagnosis present

## 2014-05-31 DIAGNOSIS — Z8719 Personal history of other diseases of the digestive system: Secondary | ICD-10-CM | POA: Insufficient documentation

## 2014-05-31 MED ORDER — MORPHINE SULFATE 4 MG/ML IJ SOLN
4.0000 mg | Freq: Once | INTRAMUSCULAR | Status: AC
  Administered 2014-05-31: 4 mg via INTRAMUSCULAR
  Filled 2014-05-31: qty 1

## 2014-05-31 MED ORDER — MORPHINE SULFATE 4 MG/ML IJ SOLN: 4.0000 mg | Freq: Once | INTRAMUSCULAR | Status: DC

## 2014-05-31 NOTE — ED Notes (Signed)
Pt was transfer to [facility] via pov, discharge papers in hand, gave report to Johnson, Therapist, sports at [facility] ED.

## 2014-05-31 NOTE — ED Provider Notes (Signed)
CSN: [id number]     Arrival date & time 05/31/14  0451 History   First MD Initiated Contact with Patient 05/31/14 0502     Chief Complaint  Patient presents with  . Joint Swelling     (Consider location/radiation/quality/duration/timing/severity/associated sxs/prior Treatment) HPI  This is a 28 year old female with a history of chronic osteomyelitis of the pelvis currently on 6 weeks of ertapenem and daptomycin following the wart debridement of left iliac wing osteomyelitis and gluteal abscess who presents with increasing pain and swelling of the left hip. Patient reports over the last 24 hours she has had increasing swelling and redness at the site over her left hip. She reports that she can press on the wound inferiorly and express pustular drainage. She states prior to this the drainage had been decreasing and was more "clear." Patient denies any fevers at home. Does state that prior to arrival she had a headache but took 2 Tylenol with improvement of symptoms. She is followed by infectious diseases, orthopedics (Dr. Kayleen Memos) at [facility].  I have reviewed the patient's chart and outside records.  Patient was seen and evaluated here in late September. She was transferred to [facility] given evidence of abscess on CT scan. She had OR debridement.  Cultures were negative. She was empirically placed on antibiotics. Due to follow-up in early November.  Past Medical History  Diagnosis Date  . Arrhythmia   . Chronic pain syndrome   . IBS (irritable bowel syndrome)   . Chronic constipation   . Chronic pelvic pain in female    Past Surgical History  Procedure Laterality Date  . Cesarean section    . Pelvic fracture surgery    . Tonsillectomy    . Tubal ligation    . Ovarian cyst removal Left   . Laparoscopic unilateral salpingo oopherectomy Left 10/11/2013    Procedure:  LAPAROSCOPIC LEFT SALPINGO OOPHORECTOMY;  Surgeon: [name], MD;  Location: [facility];  Service: Gynecology;   Laterality: Left;  . Bilateral salpingectomy Right 10/11/2013    Procedure: RIGHT SALPINGECTOMY;  Surgeon: [name], MD;  Location: [facility];  Service: Gynecology;  Laterality: Right;  . Diagnostic laparoscopy with removal of ectopic pregnancy N/A 10/15/2013    Procedure: DIAGNOSTIC LAPAROSCOPY ;  Surgeon: [name], MD;  Location: [facility];  Service: Gynecology;  Laterality: N/A;  . Cholecystectomy N/A 11/10/2013    Procedure: LAPAROSCOPIC CHOLECYSTECTOMY;  Surgeon: Scherry Ran, MD;  Location: [facility];  Service: General;  Laterality: N/A;  . Ercp N/A 11/18/2013    Procedure: ENDOSCOPIC RETROGRADE CHOLANGIOPANCREATOGRAPHY (ERCP) WITH SMALL SPHINCTEROTOMY;  Surgeon: Rogene Houston, MD;  Location: [facility];  Service: Endoscopy;  Laterality: N/A;  . Ercp N/A 01/13/2014    Procedure: ENDOSCOPIC RETROGRADE CHOLANGIOPANCREATOGRAPHY (ERCP);  Surgeon: Rogene Houston, MD;  Location: [facility];  Service: Endoscopy;  Laterality: N/A;  . Stent removal N/A 01/13/2014    Procedure: STENT REMOVAL;  Surgeon: Rogene Houston, MD;  Location: [facility];  Service: Endoscopy;  Laterality: N/A;  . Sphincterotomy N/A 01/13/2014    Procedure: SPHINCTEROTOMY;  Surgeon: Rogene Houston, MD;  Location: [facility];  Service: Endoscopy;  Laterality: N/A;   Family History  Problem Relation Age of Onset  . Hypertension Mother    History  Substance Use Topics  . Smoking status: Current Every Day Smoker -- 0.50 packs/day for 2 years    Types: Cigarettes  . Smokeless tobacco: Never Used  . Alcohol Use: Yes  Comment: socially   OB History   Grav Para Term Preterm Abortions TAB SAB Ect Mult Living                 Review of Systems  Constitutional: Negative for fever.  Respiratory: Negative for chest tightness and shortness of breath.   Cardiovascular: Negative for chest pain.  Musculoskeletal: Negative for back pain.  Skin: Positive for color change and wound.       Pustular drainage  Neurological: Positive for  headaches.  All other systems reviewed and are negative.     Allergies  Amoxicillin; Bee venom; Shellfish allergy; Vancomycin hcl; Iodine; Other; and Penicillins  Home Medications   Prior to Admission medications   Medication Sig Start Date End Date Taking? Authorizing Provider  acetaminophen (TYLENOL) 325 MG tablet Take 650 mg by mouth every 6 (six) hours.   Yes Historical Provider, MD  DAPTOMYCIN IV Inject 340 mg into the vein 1 day or 1 dose.   Yes Historical Provider, MD  EPINEPHrine (EPIPEN) 0.3 mg/0.3 mL DEVI Inject 0.3 mg into the muscle once.   Yes Historical Provider, MD  ertapenem 1 g in sodium chloride 0.9 % 50 mL Inject 1 g into the vein daily.   Yes Historical Provider, MD  ferrous sulfate 325 (65 FE) MG EC tablet Take 325 mg by mouth daily with breakfast.   Yes Historical Provider, MD  gabapentin (NEURONTIN) 300 MG capsule Take 300 mg by mouth 3 (three) times daily.   Yes Historical Provider, MD  Linaclotide Rolan Lipa) 145 MCG CAPS capsule Take 1 capsule (145 mcg total) by mouth daily. 02/02/14  Yes Rogene Houston, MD  oxyCODONE (ROXICODONE) 15 MG immediate release tablet Take 10 mg by mouth every 4 (four) hours as needed for pain.   Yes Historical Provider, MD  pantoprazole (PROTONIX) 40 MG tablet Take 1 tablet (40 mg total) by mouth at bedtime. 11/25/13  Yes [name], MD  ceFEPIme 1 g in dextrose 5 % 50 mL Inject 1 g into the vein every 8 (eight) hours. 04/24/14   Reyne Dumas, MD  clindamycin (CLEOCIN) 300 MG capsule Take 300 mg by mouth 3 (three) times daily.    Historical Provider, MD  diclofenac sodium (VOLTAREN) 1 % GEL Apply 2 g topically daily as needed (for pain).    Historical Provider, MD  ibuprofen (ADVIL,MOTRIN) 800 MG tablet Take 800 mg by mouth every 6 (six) hours as needed for moderate pain.    Historical Provider, MD  lidocaine (XYLOCAINE) 2 % jelly Apply 1 application topically as needed (for wound).     Historical Provider, MD  naproxen sodium (ANAPROX)  220 MG tablet Take 220 mg by mouth every 8 (eight) hours as needed (for pain).    Historical Provider, MD  polyethylene glycol (MIRALAX / GLYCOLAX) packet Take 17 g by mouth daily as needed for mild constipation. 04/24/14   Reyne Dumas, MD  retapamulin (ALTABAX) 1 % ointment Apply 1 application topically 3 (three) times daily.    Historical Provider, MD  Vancomycin (VANCOCIN) 750 MG/150ML SOLN Inject 150 mLs (750 mg total) into the vein every 8 (eight) hours. 04/24/14   Reyne Dumas, MD   BP 122/79  Pulse 92  Temp(Src) 98.5 F (36.9 C) (Oral)  Resp 16  SpO2 100% Physical Exam  Nursing note and vitals reviewed. Constitutional: She is oriented to person, place, and time. She appears well-developed and well-nourished. No distress.  HENT:  Head: Normocephalic and atraumatic.  Cardiovascular: Normal rate,  regular rhythm and normal heart sounds.   No murmur heard. Pulmonary/Chest: Effort normal and breath sounds normal. No respiratory distress. She has no wheezes.  Musculoskeletal:  PICC line left upper extremity, clean dry and intact  Neurological: She is alert and oriented to person, place, and time.  Skin: Skin is warm and dry.  Focused examination of left pelvic wound reveals a 5 cm open wound with well granulated margins, mild erythema and swelling adjacent to the wound, unable to express any purulent drainage, good range of motion at the left hip  Psychiatric: She has a normal mood and affect.    ED Course  Procedures (including critical care time) Labs Review Labs Reviewed - No data to display  Imaging Review No results found.   EKG Interpretation None     Medications  morphine 4 MG/ML injection 4 mg (not administered)    MDM   Final diagnoses:  Chronic osteomyelitis  Draining postoperative wound, initial encounter   Patient presents with concern for wound infection. She is nontoxic on exam.  She is afebrile. She is a chronic well granulated wound over the left iliac.   Mild erythema and swelling noted at this time unable to express any purulence. However, patient did show me a video where she was able to express purulent drainage from the wound earlier today. Given that the patient is well-appearing, will attempt to contact orthopedics at [facility] for recommendations regarding workup. Is unable to speak to the orthopedist on call; however, I did speak to the ER physician. Feel patient would benefit from evaluation at [facility] in the ER and in consultation with orthopedics in order to best evaluate this wound and further workup. Patient is nontoxic and does not appear to have an acute emergent condition that requires lab work or imaging.  An feel patient can be safely and appropriately evaluating at [facility]. Given that the patient is clinically well-appearing, she has elected to go by private vehicle. Her husband will transport her. She was given 4 mg of IM morphine for pain.  Discussed plan with ER physician, Dr. Christianne Dolin.  She has been accepted in transfer to [facility] ER.      Merryl Hacker, MD 05/31/14 [phone]

## 2014-05-31 NOTE — ED Notes (Signed)
Pt had surgery to hips and pelvis for osteomyelitis problems, states overnight she started having pain and swelling to left hip area that she feels is related to the same problem.

## 2014-07-03 ENCOUNTER — Ambulatory Visit (INDEPENDENT_AMBULATORY_CARE_PROVIDER_SITE_OTHER): Payer: Medicaid Other | Admitting: Internal Medicine

## 2014-07-08 ENCOUNTER — Emergency Department (HOSPITAL_COMMUNITY): Payer: Medicaid Other

## 2014-07-08 ENCOUNTER — Emergency Department (HOSPITAL_COMMUNITY)
Admission: EM | Admit: 2014-07-08 | Discharge: 2014-07-08 | Disposition: A | Discharge status: Home / Self Care | Payer: Medicaid Other | Attending: Emergency Medicine | Admitting: Emergency Medicine

## 2014-07-08 ENCOUNTER — Telehealth (HOSPITAL_COMMUNITY): Payer: Self-pay

## 2014-07-08 ENCOUNTER — Encounter (HOSPITAL_COMMUNITY): Payer: Self-pay | Admitting: Emergency Medicine

## 2014-07-08 DIAGNOSIS — K589 Irritable bowel syndrome without diarrhea: Secondary | ICD-10-CM | POA: Diagnosis not present

## 2014-07-08 DIAGNOSIS — R918 Other nonspecific abnormal finding of lung field: Secondary | ICD-10-CM

## 2014-07-08 DIAGNOSIS — K5900 Constipation, unspecified: Secondary | ICD-10-CM | POA: Diagnosis not present

## 2014-07-08 DIAGNOSIS — G894 Chronic pain syndrome: Secondary | ICD-10-CM | POA: Insufficient documentation

## 2014-07-08 DIAGNOSIS — Z8679 Personal history of other diseases of the circulatory system: Secondary | ICD-10-CM | POA: Insufficient documentation

## 2014-07-08 DIAGNOSIS — Z79899 Other long term (current) drug therapy: Secondary | ICD-10-CM | POA: Insufficient documentation

## 2014-07-08 DIAGNOSIS — N8320 Unspecified ovarian cysts: Secondary | ICD-10-CM | POA: Insufficient documentation

## 2014-07-08 DIAGNOSIS — Z87891 Personal history of nicotine dependence: Secondary | ICD-10-CM | POA: Insufficient documentation

## 2014-07-08 DIAGNOSIS — Z880 Allergy status to penicillin: Secondary | ICD-10-CM | POA: Insufficient documentation

## 2014-07-08 DIAGNOSIS — R1031 Right lower quadrant pain: Secondary | ICD-10-CM | POA: Insufficient documentation

## 2014-07-08 DIAGNOSIS — Z792 Long term (current) use of antibiotics: Secondary | ICD-10-CM | POA: Insufficient documentation

## 2014-07-08 DIAGNOSIS — Z8739 Personal history of other diseases of the musculoskeletal system and connective tissue: Secondary | ICD-10-CM | POA: Insufficient documentation

## 2014-07-08 DIAGNOSIS — Z3202 Encounter for pregnancy test, result negative: Secondary | ICD-10-CM | POA: Insufficient documentation

## 2014-07-08 DIAGNOSIS — N83201 Unspecified ovarian cyst, right side: Secondary | ICD-10-CM

## 2014-07-08 HISTORY — DX: Osteomyelitis, unspecified: M86.9

## 2014-07-08 LAB — URINALYSIS, ROUTINE W REFLEX MICROSCOPIC
GLUCOSE, UA: NEGATIVE mg/dL
Ketones, ur: NEGATIVE mg/dL
LEUKOCYTES UA: NEGATIVE
Nitrite: NEGATIVE
Protein, ur: 30 mg/dL — AB
Specific Gravity, Urine: >1.03 — ABNORMAL HIGH (ref 1.005–1.030)
UROBILINOGEN UA: 0.2 mg/dL (ref 0.0–1.0)
pH: 6 (ref 5.0–8.0)

## 2014-07-08 LAB — CBC WITH DIFFERENTIAL/PLATELET
BASOS ABS: 0 10*3/uL (ref 0.0–0.1)
BASOS PCT: 0 % (ref 0–1)
EOS ABS: 0.1 10*3/uL (ref 0.0–0.7)
EOS PCT: 2 % (ref 0–5)
HEMATOCRIT: 34 % — AB (ref 36.0–46.0)
Hemoglobin: 11.2 g/dL — ABNORMAL LOW (ref 12.0–15.0)
Lymphocytes Relative: 37 % (ref 12–46)
Lymphs Abs: 2.9 10*3/uL (ref 0.7–4.0)
MCH: 28 pg (ref 26.0–34.0)
MCHC: 32.9 g/dL (ref 30.0–36.0)
MCV: 85 fL (ref 78.0–100.0)
Monocytes Absolute: 0.4 10*3/uL (ref 0.1–1.0)
Monocytes Relative: 6 % (ref 3–12)
Neutro Abs: 4.3 10*3/uL (ref 1.7–7.7)
Neutrophils Relative %: 55 % (ref 43–77)
PLATELETS: 242 10*3/uL (ref 150–400)
RBC: 4 MIL/uL (ref 3.87–5.11)
RDW: 16 % — AB (ref 11.5–15.5)
WBC: 7.7 10*3/uL (ref 4.0–10.5)

## 2014-07-08 LAB — URINE MICROSCOPIC-ADD ON

## 2014-07-08 LAB — COMPREHENSIVE METABOLIC PANEL
ALT: 8 U/L (ref 0–35)
AST: 14 U/L (ref 0–37)
Albumin: 3.7 g/dL (ref 3.5–5.2)
Alkaline Phosphatase: 88 U/L (ref 39–117)
Anion gap: 14 (ref 5–15)
BUN: 16 mg/dL (ref 6–23)
CALCIUM: 9.1 mg/dL (ref 8.4–10.5)
CO2: 24 mEq/L (ref 19–32)
Chloride: 102 mEq/L (ref 96–112)
Creatinine, Ser: 0.87 mg/dL (ref 0.50–1.10)
GFR calc Af Amer: >90 mL/min (ref >90)
GFR, EST NON AFRICAN AMERICAN: 90 mL/min — AB (ref >90)
Glucose, Bld: 90 mg/dL (ref 70–99)
Potassium: 3.9 mEq/L (ref 3.7–5.3)
SODIUM: 140 meq/L (ref 137–147)
TOTAL PROTEIN: 8.3 g/dL (ref 6.0–8.3)
Total Bilirubin: 0.3 mg/dL (ref 0.3–1.2)

## 2014-07-08 LAB — WET PREP, GENITAL
CLUE CELLS WET PREP: NONE SEEN
Trich, Wet Prep: NONE SEEN
Yeast Wet Prep HPF POC: NONE SEEN

## 2014-07-08 LAB — PREGNANCY, URINE: Preg Test, Ur: NEGATIVE

## 2014-07-08 MED ORDER — ONDANSETRON HCL 4 MG/2ML IJ SOLN
4.0000 mg | Freq: Once | INTRAMUSCULAR | Status: AC
  Administered 2014-07-08: 4 mg via INTRAVENOUS

## 2014-07-08 MED ORDER — OXYCODONE-ACETAMINOPHEN 5-325 MG PO TABS: 2.0000 | ORAL_TABLET | ORAL | Status: DC | PRN

## 2014-07-08 MED ORDER — HYDROMORPHONE HCL 1 MG/ML IJ SOLN
1.0000 mg | Freq: Once | INTRAMUSCULAR | Status: AC
Start: 2014-07-08 — End: 2014-07-08
  Administered 2014-07-08: 1 mg via INTRAVENOUS
  Filled 2014-07-08: qty 1

## 2014-07-08 MED ORDER — PROMETHAZINE HCL 25 MG PO TABS: 25.0000 mg | ORAL_TABLET | Freq: Four times a day (QID) | ORAL | Status: DC | PRN

## 2014-07-08 MED ORDER — ONDANSETRON HCL 4 MG/2ML IJ SOLN
4.0000 mg | Freq: Once | INTRAMUSCULAR | Status: AC
  Administered 2014-07-08: 4 mg via INTRAVENOUS
  Filled 2014-07-08: qty 2

## 2014-07-08 MED ORDER — SODIUM CHLORIDE 0.9 % IV BOLUS (SEPSIS)
1000.0000 mL | Freq: Once | INTRAVENOUS | Status: AC
  Administered 2014-07-08: 1000 mL via INTRAVENOUS

## 2014-07-08 MED ORDER — IOHEXOL 300 MG/ML  SOLN
100.0000 mL | Freq: Once | INTRAMUSCULAR | Status: AC | PRN
Start: 2014-07-08 — End: 2014-07-08
  Administered 2014-07-08: 100 mL via INTRAVENOUS

## 2014-07-08 MED ORDER — HYDROMORPHONE HCL 1 MG/ML IJ SOLN
1.0000 mg | Freq: Once | INTRAMUSCULAR | Status: AC
  Administered 2014-07-08: 1 mg via INTRAVENOUS
  Filled 2014-07-08: qty 1

## 2014-07-08 MED ORDER — CIPROFLOXACIN HCL 500 MG PO TABS: 500.0000 mg | ORAL_TABLET | Freq: Two times a day (BID) | ORAL | Status: DC

## 2014-07-08 MED ORDER — OXYCODONE HCL 5 MG PO TABS: 10.0000 mg | ORAL_TABLET | ORAL | Status: DC | PRN

## 2014-07-08 MED ORDER — ONDANSETRON HCL 4 MG/2ML IJ SOLN
INTRAMUSCULAR | Status: AC
  Filled 2014-07-08: qty 2
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
## 2014-07-08 NOTE — ED Notes (Signed)
Pt verbalized understanding of no driving and to use caution within 4 hours of taking pain meds due to meds cause drowsiness 

## 2014-07-08 NOTE — ED Notes (Signed)
Pt attempting for urine sample.  

## 2014-07-08 NOTE — Telephone Encounter (Signed)
Pharmacy calling to see if we wanted them to fill Rx for OxyContin IR 5 mg #30 written by Dr B. Cook 12/4.  Pt per pharmacy/[place] drug data base had Rx for  OxyContin 10 mg, #90 filled 07/03/2014 and Oxycontin 15 mg, # 90 filled 07/05/2014.  Dr B. [facility] consulted and requests that Rx for Oxy IR not be filled.  Pharmacist informed. Controlled Medication Caution Flag set.

## 2014-07-08 NOTE — Discharge Instructions (Signed)
Prescriptions for pain, nausea, new antibiotic. Call your infectious disease doctor to discuss if the new antibiotic is appropriate. This antibiotic is in the same category as Avelox.  You have a 5 mm mass in your right lung. You will need a repeat CT scan of your lung and 3-9 months depending your risk factors. This can be arranged by your primary care doctor. CT scan shows probable right ovarian cyst.

## 2014-07-08 NOTE — ED Notes (Signed)
Pt c/o intermittent rlq abd pain x 3 days with low grade fever. Some nausea. Denies v/d. lnbm Thursday. Pt also reports scant amount of brownish vaginal discharge.

## 2014-07-08 NOTE — ED Notes (Signed)
MD at bedside. 

## 2014-07-10 LAB — GC/CHLAMYDIA PROBE AMP
CT Probe RNA: NEGATIVE
GC PROBE AMP APTIMA: NEGATIVE

## 2014-07-26 ENCOUNTER — Telehealth (INDEPENDENT_AMBULATORY_CARE_PROVIDER_SITE_OTHER): Payer: Self-pay | Admitting: *Deleted

## 2014-07-26 ENCOUNTER — Encounter (INDEPENDENT_AMBULATORY_CARE_PROVIDER_SITE_OTHER): Payer: Self-pay | Admitting: *Deleted

## 2014-07-26 NOTE — Telephone Encounter (Signed)
[place] for her apt on 07/03/14 with Dr. Laural Golden. A NS letter has been mailed.

## 2014-07-31 NOTE — Telephone Encounter (Signed)
Noted that Ms.Rozeboom did not show.

## 2014-08-02 ENCOUNTER — Encounter (INDEPENDENT_AMBULATORY_CARE_PROVIDER_SITE_OTHER): Payer: Self-pay

## 2014-09-09 IMAGING — US US ABDOMEN LIMITED
1 series · 14 of 25 positions shown · non-contrast
Comparison: US ABDOMEN COMPLETE dated 10/16/2013

CLINICAL DATA: Gallstones

EXAM:
US ABDOMEN LIMITED - RIGHT UPPER QUADRANT

[Series 1: us abdomen limited · 0.18mm/px · 14 of 65 slices shown]
[im 1/65]
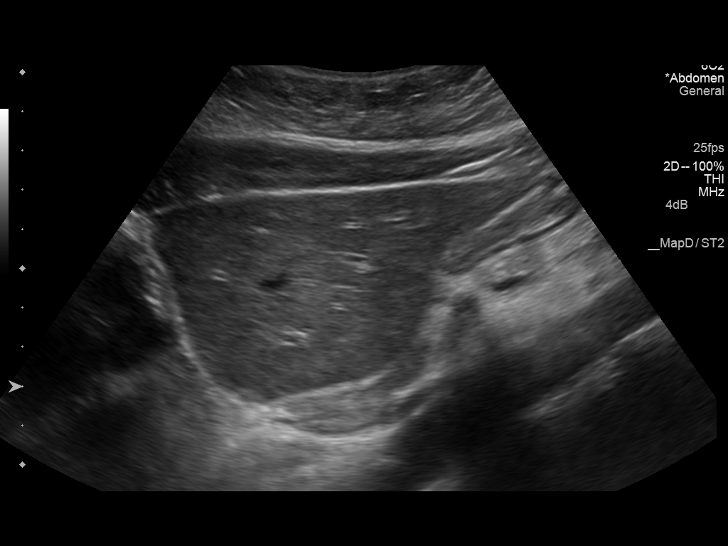
[im 6/65]
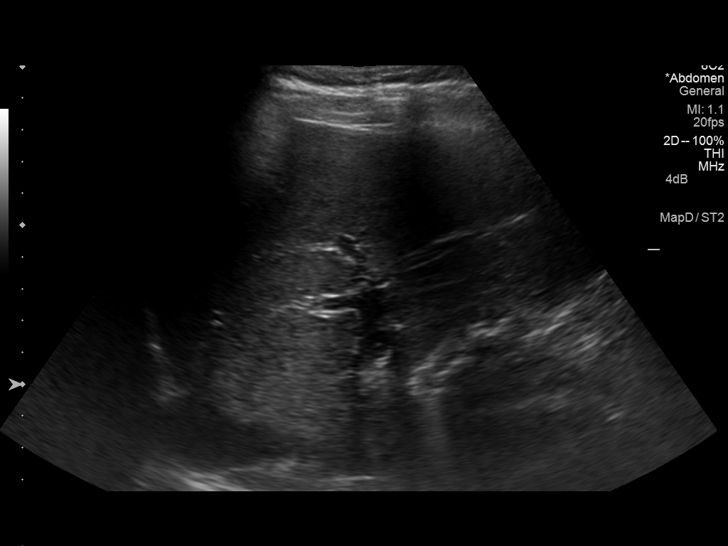
[im 11/65]
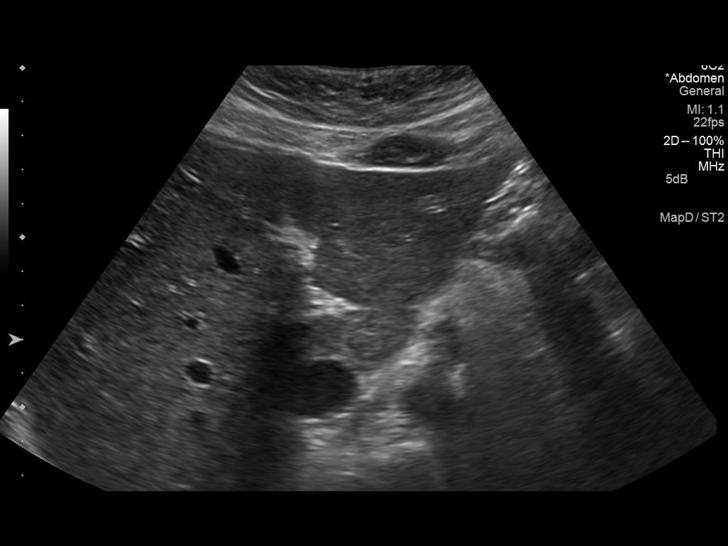
[im 17/65]
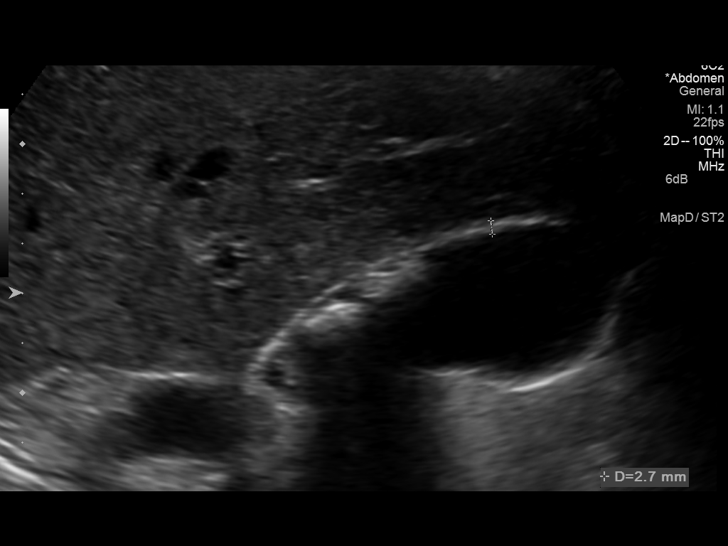
[im 22/65]
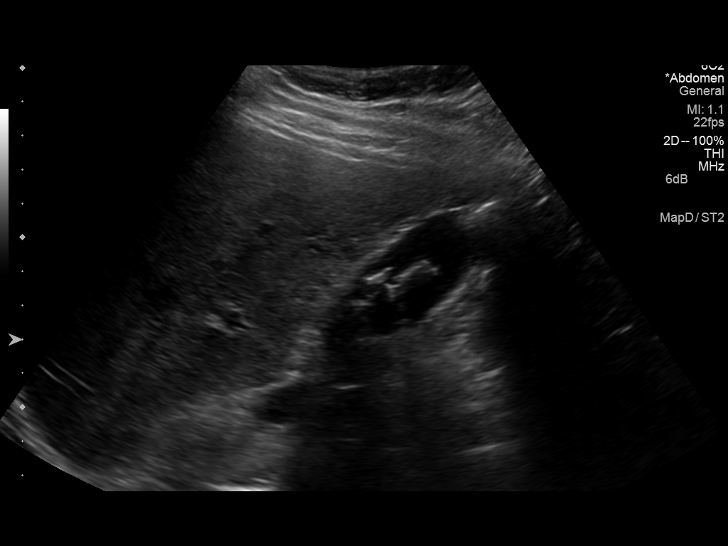
[im 25/65]
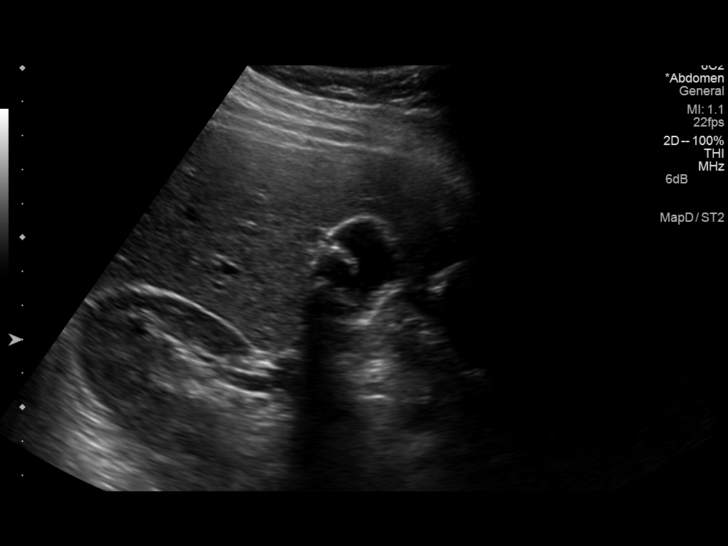
[im 30/65]
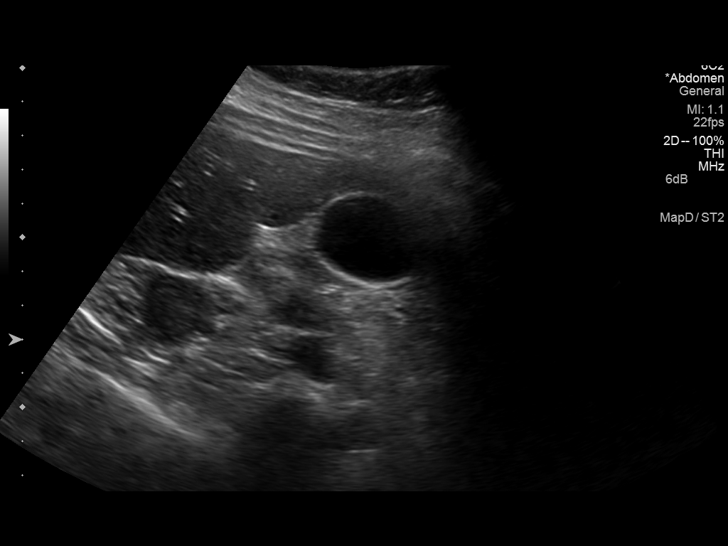
[im 35/65]
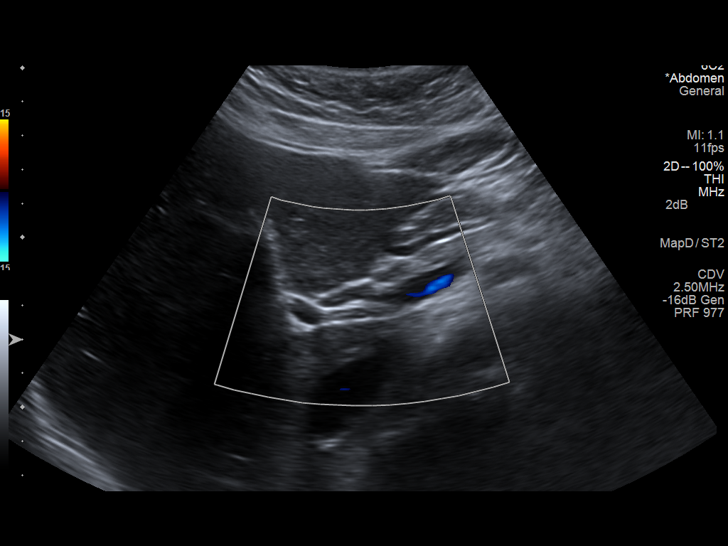
[im 41/65]
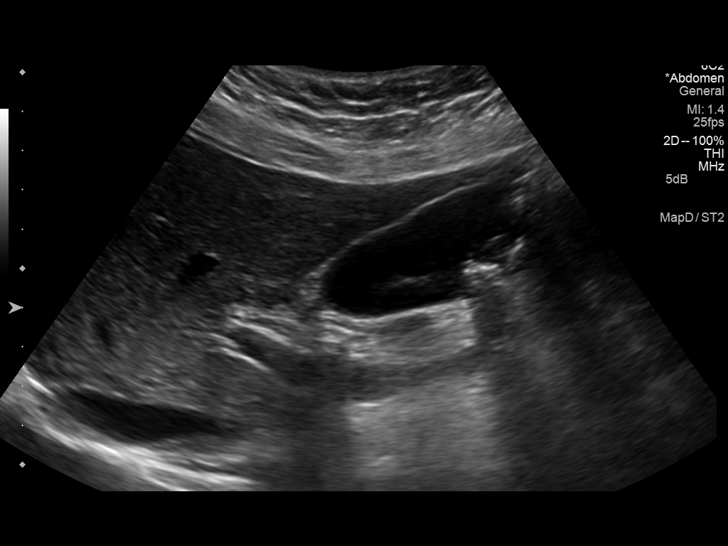
[im 43/65]
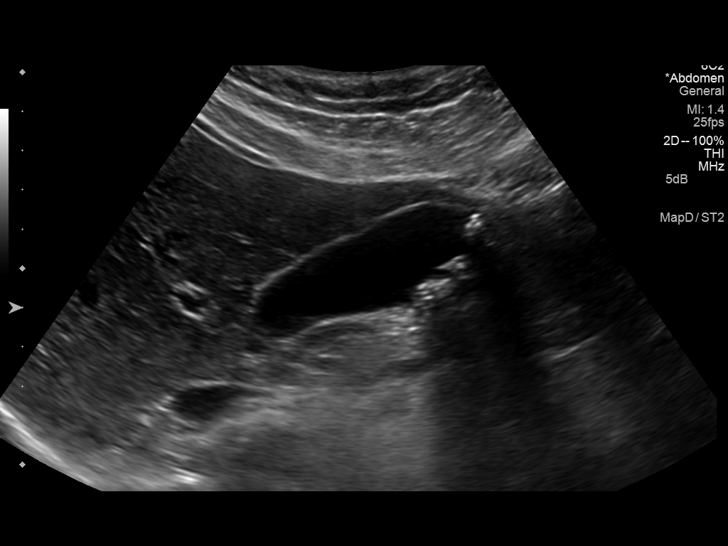
[im 49/65]
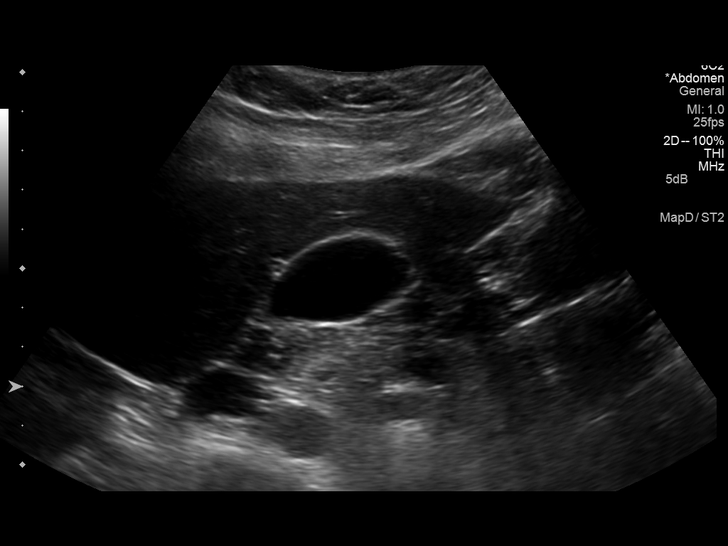
[im 54/65]
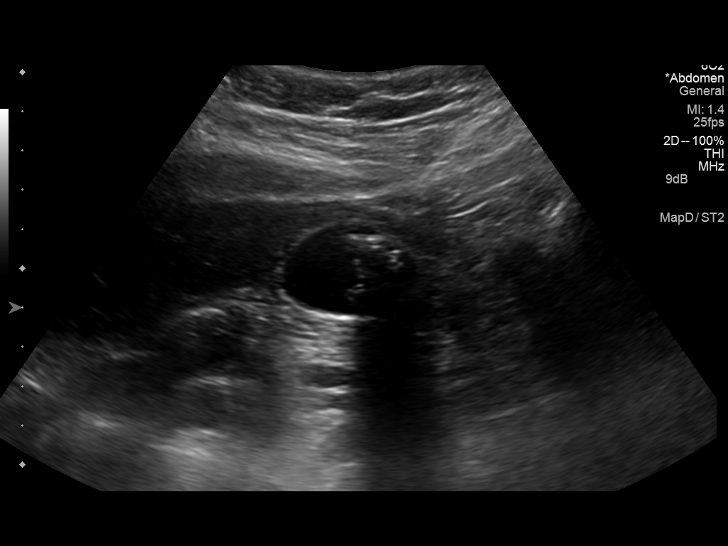
[im 59/65]
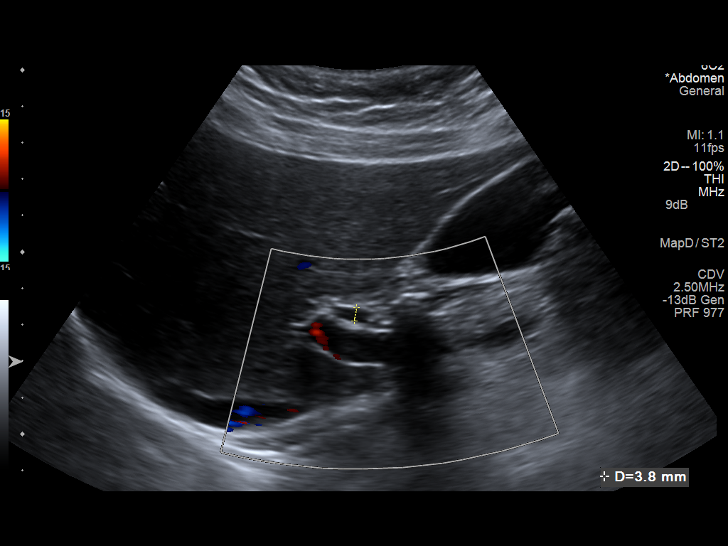
[im 65/65]
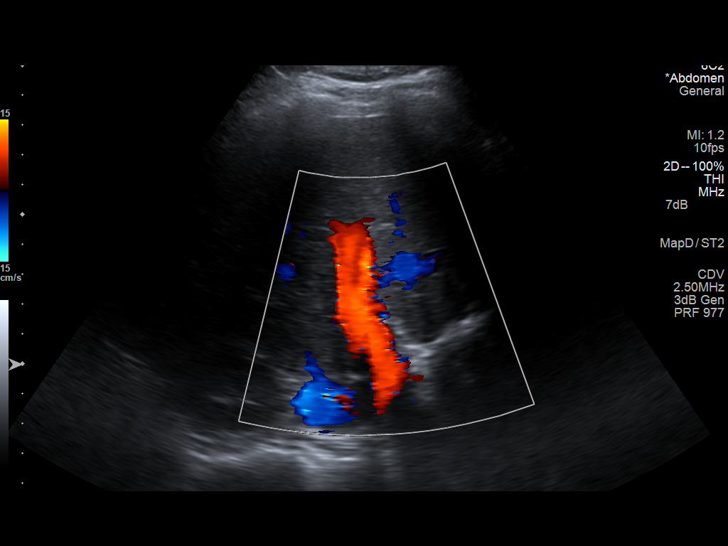

[14 of 25 positions shown; findings below may reference images not displayed]

FINDINGS: Gallbladder:

Multiple mobile gallstones are appreciated within the gallbladder.
There is no gallbladder wall thickening, measuring 2.7 mm. No
pericholecystic fluid nor a sonographic Murphy's sign.

Common bile duct:

Diameter:

Liver:

No focal lesion identified. Within normal limits in parenchymal
echogenicity.
IMPRESSION: Cholelithiasis without evidence of cholecystitis.

## 2014-09-15 IMAGING — US US ABDOMEN LIMITED
1 series · 14 of 25 positions shown · non-contrast
Comparison: None.

CLINICAL DATA: 4 days s/p lap. gall bladder

EXAM:
US ABDOMEN LIMITED - RIGHT UPPER QUADRANT

[Series 1: us abdomen limited · 0.24mm/px · 14 of 36 slices shown]
[im 1/36]
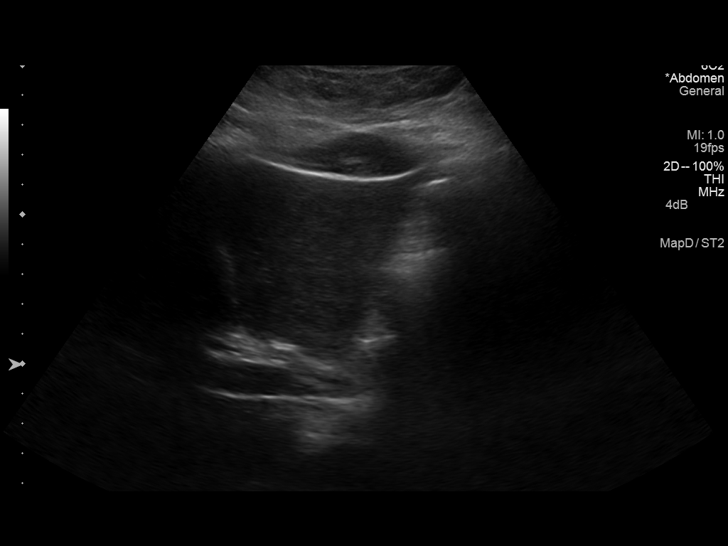
[im 3/36]
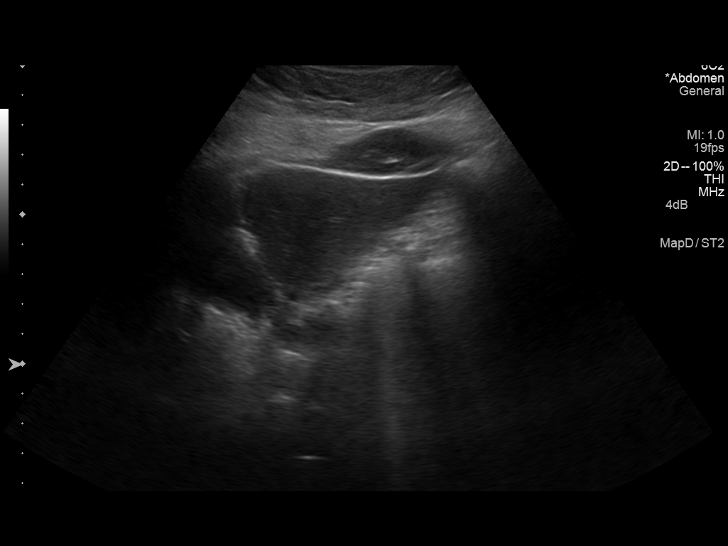
[im 6/36]
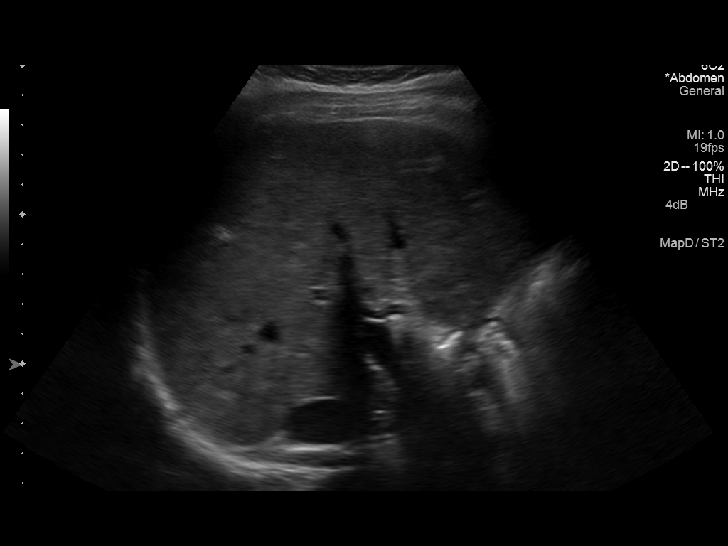
[im 9/36]
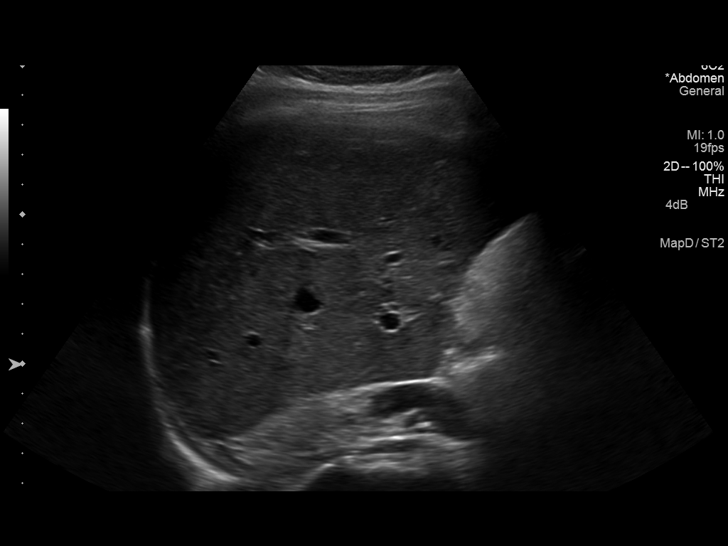
[im 12/36]
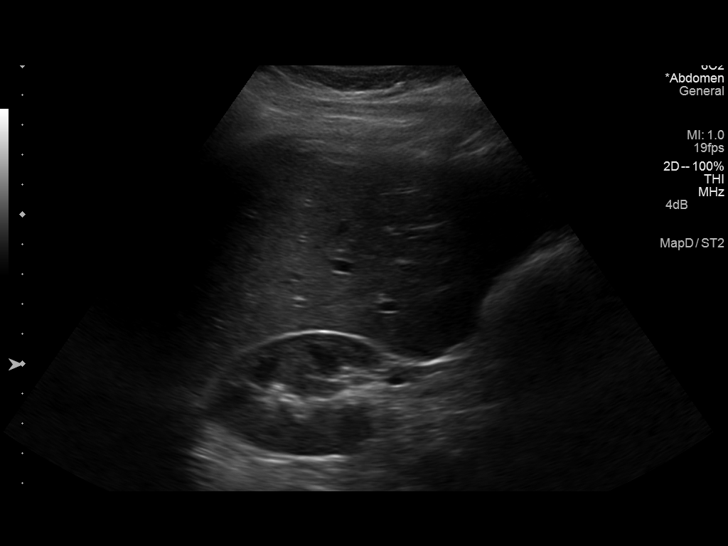
[im 14/36]
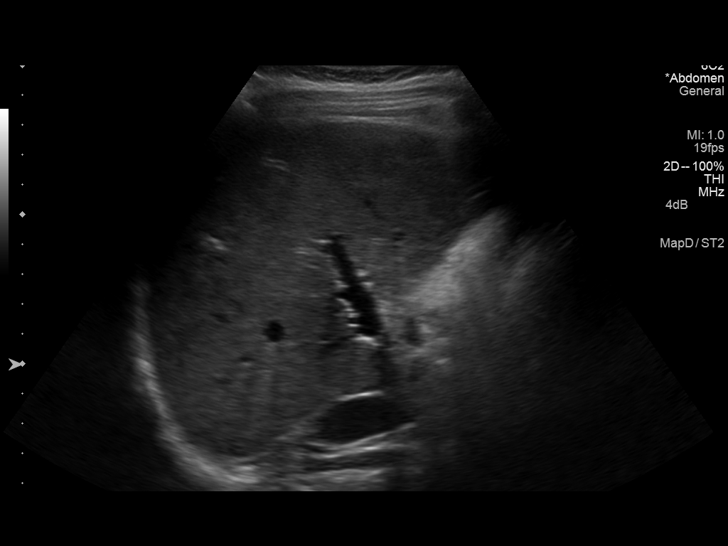
[im 17/36]
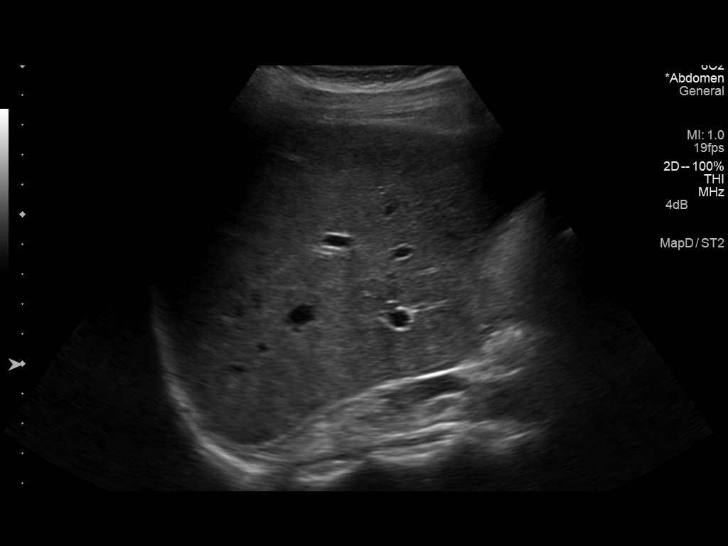
[im 19/36]
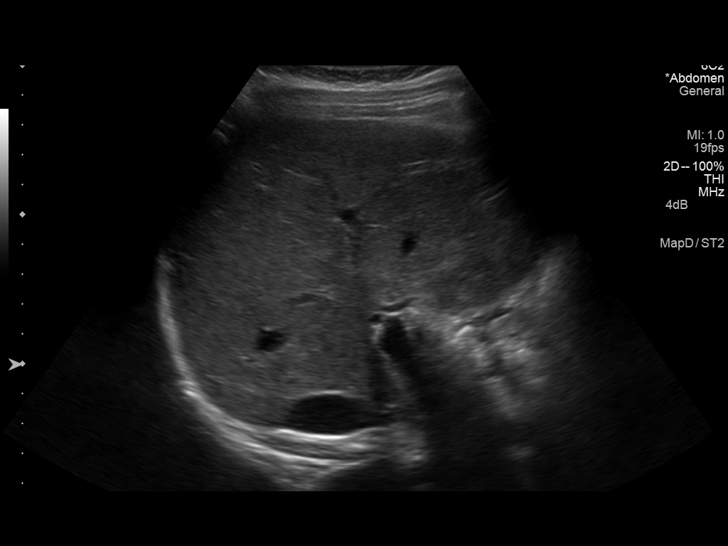
[im 22/36]
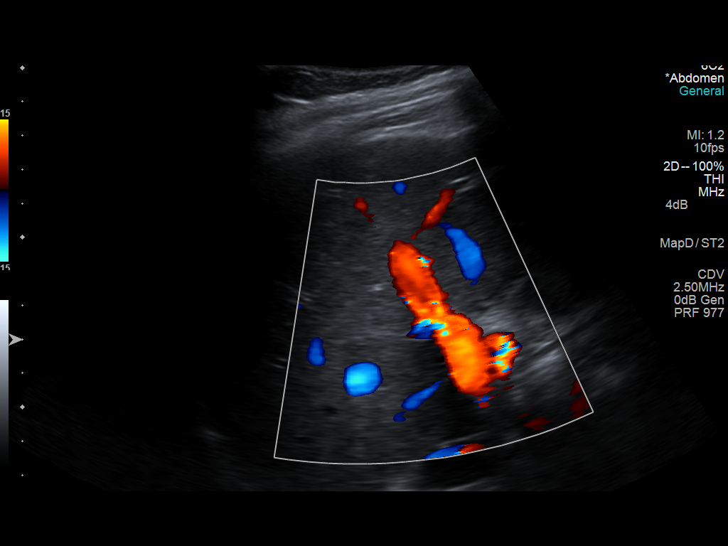
[im 24/36]
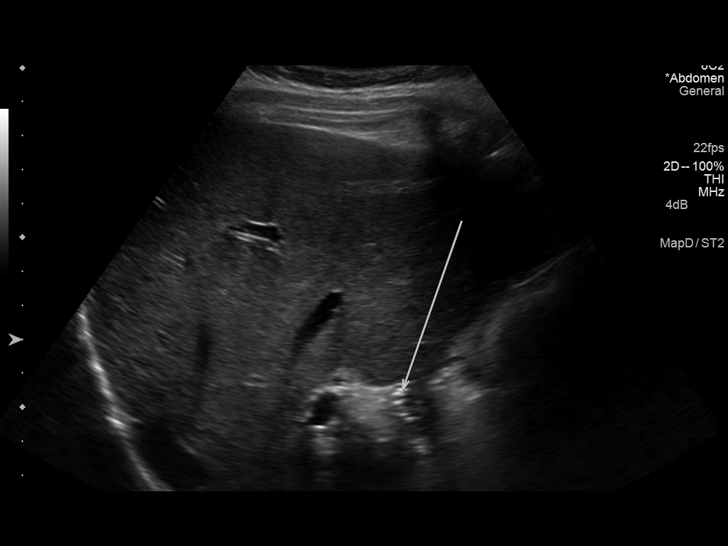
[im 27/36]
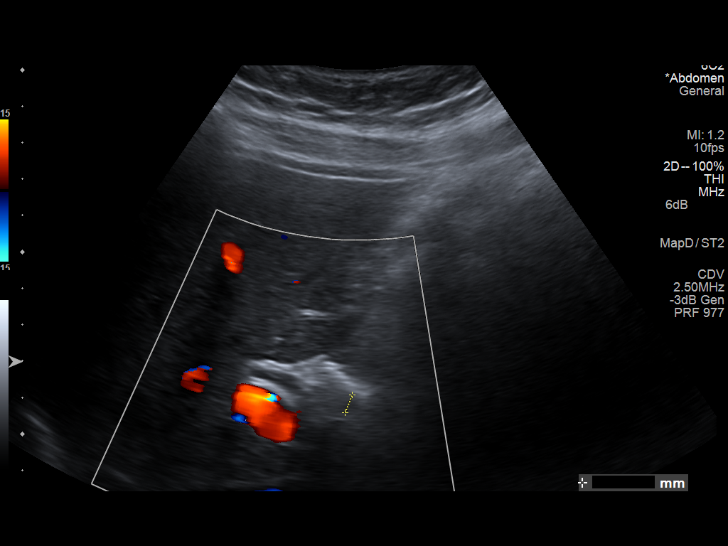
[im 30/36]
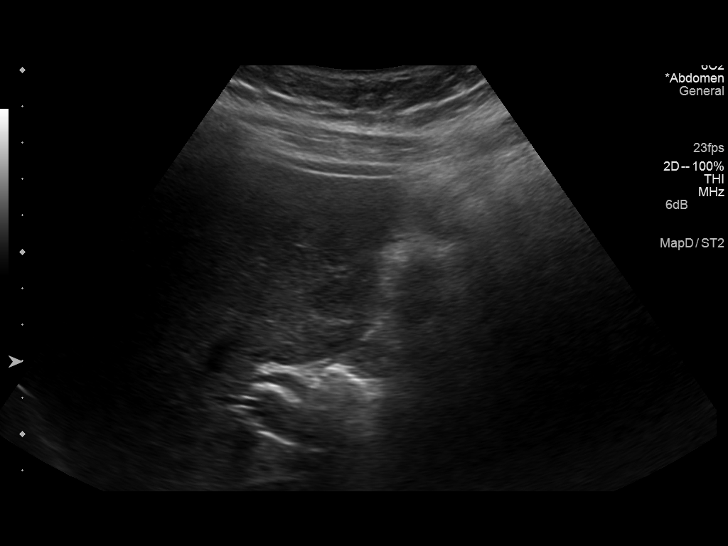
[im 33/36]
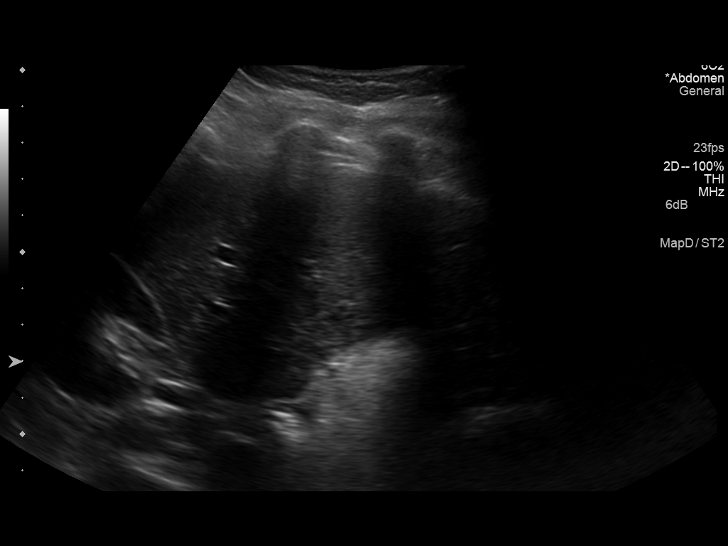
[im 36/36]
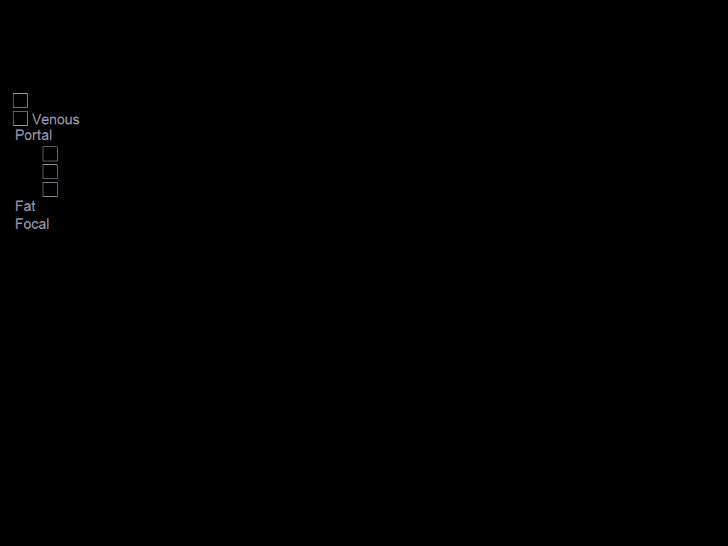

[14 of 25 positions shown; findings below may reference images not displayed]

FINDINGS: Gallbladder:

Status post cholecystectomy.  Gallbladder fossa unremarkable.

Common bile duct:

Diameter: 5.3 mm

Liver:

No focal lesion identified. Within normal limits in parenchymal
echogenicity.
IMPRESSION: Negative right upper quadrant ultrasound. Gallbladder fossa is
unremarkable.

## 2014-10-24 ENCOUNTER — Emergency Department (HOSPITAL_COMMUNITY): Payer: Medicaid Other

## 2014-10-24 ENCOUNTER — Encounter (HOSPITAL_COMMUNITY): Payer: Self-pay | Admitting: *Deleted

## 2014-10-24 ENCOUNTER — Emergency Department (HOSPITAL_COMMUNITY)
Admission: EM | Admit: 2014-10-24 | Discharge: 2014-10-25 | Disposition: A | Discharge status: Other Acute Inpatient Hospital | Payer: Medicaid Other | Attending: Emergency Medicine | Admitting: Emergency Medicine

## 2014-10-24 DIAGNOSIS — L0231 Cutaneous abscess of buttock: Secondary | ICD-10-CM | POA: Insufficient documentation

## 2014-10-24 DIAGNOSIS — Z8719 Personal history of other diseases of the digestive system: Secondary | ICD-10-CM | POA: Diagnosis not present

## 2014-10-24 DIAGNOSIS — G894 Chronic pain syndrome: Secondary | ICD-10-CM | POA: Insufficient documentation

## 2014-10-24 DIAGNOSIS — Z3202 Encounter for pregnancy test, result negative: Secondary | ICD-10-CM | POA: Diagnosis not present

## 2014-10-24 DIAGNOSIS — Z8739 Personal history of other diseases of the musculoskeletal system and connective tissue: Secondary | ICD-10-CM | POA: Insufficient documentation

## 2014-10-24 DIAGNOSIS — Z720 Tobacco use: Secondary | ICD-10-CM | POA: Insufficient documentation

## 2014-10-24 DIAGNOSIS — Z87448 Personal history of other diseases of urinary system: Secondary | ICD-10-CM | POA: Insufficient documentation

## 2014-10-24 DIAGNOSIS — Z79899 Other long term (current) drug therapy: Secondary | ICD-10-CM | POA: Diagnosis not present

## 2014-10-24 DIAGNOSIS — Z880 Allergy status to penicillin: Secondary | ICD-10-CM | POA: Diagnosis not present

## 2014-10-24 DIAGNOSIS — M25552 Pain in left hip: Secondary | ICD-10-CM | POA: Diagnosis present

## 2014-10-24 DIAGNOSIS — Z792 Long term (current) use of antibiotics: Secondary | ICD-10-CM | POA: Diagnosis not present

## 2014-10-24 LAB — URINALYSIS, ROUTINE W REFLEX MICROSCOPIC
Bilirubin Urine: NEGATIVE
Glucose, UA: NEGATIVE mg/dL
KETONES UR: NEGATIVE mg/dL
LEUKOCYTES UA: NEGATIVE
Nitrite: NEGATIVE
PROTEIN: NEGATIVE mg/dL
Specific Gravity, Urine: >1.03 — ABNORMAL HIGH (ref 1.005–1.030)
Urobilinogen, UA: 0.2 mg/dL (ref 0.0–1.0)
pH: 5.5 (ref 5.0–8.0)

## 2014-10-24 LAB — CBC WITH DIFFERENTIAL/PLATELET
BASOS ABS: 0 10*3/uL (ref 0.0–0.1)
Basophils Relative: 0 % (ref 0–1)
EOS PCT: 1 % (ref 0–5)
Eosinophils Absolute: 0.1 10*3/uL (ref 0.0–0.7)
HCT: 36 % (ref 36.0–46.0)
Hemoglobin: 12.2 g/dL (ref 12.0–15.0)
Lymphocytes Relative: 31 % (ref 12–46)
Lymphs Abs: 3.5 10*3/uL (ref 0.7–4.0)
MCH: 30.4 pg (ref 26.0–34.0)
MCHC: 33.9 g/dL (ref 30.0–36.0)
MCV: 89.8 fL (ref 78.0–100.0)
Monocytes Absolute: 0.6 10*3/uL (ref 0.1–1.0)
Monocytes Relative: 5 % (ref 3–12)
NEUTROS ABS: 7.2 10*3/uL (ref 1.7–7.7)
Neutrophils Relative %: 63 % (ref 43–77)
Platelets: 226 10*3/uL (ref 150–400)
RBC: 4.01 MIL/uL (ref 3.87–5.11)
RDW: 14.4 % (ref 11.5–15.5)
WBC: 11.4 10*3/uL — AB (ref 4.0–10.5)

## 2014-10-24 LAB — LACTIC ACID, PLASMA: Lactic Acid, Venous: 0.9 mmol/L (ref 0.5–2.0)

## 2014-10-24 LAB — BASIC METABOLIC PANEL
Anion gap: 6 (ref 5–15)
BUN: 19 mg/dL (ref 6–23)
CALCIUM: 9.2 mg/dL (ref 8.4–10.5)
CO2: 25 mmol/L (ref 19–32)
Chloride: 105 mmol/L (ref 96–112)
Creatinine, Ser: 0.76 mg/dL (ref 0.50–1.10)
Glucose, Bld: 90 mg/dL (ref 70–99)
Potassium: 3.9 mmol/L (ref 3.5–5.1)
Sodium: 136 mmol/L (ref 135–145)

## 2014-10-24 LAB — PREGNANCY, URINE: Preg Test, Ur: NEGATIVE

## 2014-10-24 LAB — URINE MICROSCOPIC-ADD ON

## 2014-10-24 MED ORDER — MORPHINE SULFATE 4 MG/ML IJ SOLN
4.0000 mg | Freq: Once | INTRAMUSCULAR | Status: AC
  Administered 2014-10-24: 4 mg via INTRAVENOUS
  Filled 2014-10-24: qty 1

## 2014-10-24 MED ORDER — SODIUM CHLORIDE 0.9 % IV SOLN
INTRAVENOUS | Status: DC
  Administered 2014-10-24: 1000 mL via INTRAVENOUS

## 2014-10-24 MED ORDER — IOHEXOL 300 MG/ML  SOLN
100.0000 mL | Freq: Once | INTRAMUSCULAR | Status: AC | PRN
Start: 2014-10-24 — End: 2014-10-24
  Administered 2014-10-24: 100 mL via INTRAVENOUS

## 2014-10-24 MED ORDER — ONDANSETRON HCL 4 MG/2ML IJ SOLN
4.0000 mg | Freq: Once | INTRAMUSCULAR | Status: AC
  Administered 2014-10-24: 4 mg via INTRAVENOUS
  Filled 2014-10-24: qty 2

## 2014-10-24 NOTE — ED Notes (Signed)
Pain in pelvis, low grade fever, nausea, no vomiting
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
## 2014-10-25 DIAGNOSIS — Z8719 Personal history of other diseases of the digestive system: Secondary | ICD-10-CM | POA: Diagnosis not present

## 2014-10-25 DIAGNOSIS — M25552 Pain in left hip: Secondary | ICD-10-CM | POA: Diagnosis present

## 2014-10-25 DIAGNOSIS — Z87448 Personal history of other diseases of urinary system: Secondary | ICD-10-CM | POA: Diagnosis not present

## 2014-10-25 DIAGNOSIS — Z3202 Encounter for pregnancy test, result negative: Secondary | ICD-10-CM | POA: Diagnosis not present

## 2014-10-25 DIAGNOSIS — Z792 Long term (current) use of antibiotics: Secondary | ICD-10-CM | POA: Diagnosis not present

## 2014-10-25 DIAGNOSIS — Z8739 Personal history of other diseases of the musculoskeletal system and connective tissue: Secondary | ICD-10-CM | POA: Diagnosis not present

## 2014-10-25 DIAGNOSIS — Z880 Allergy status to penicillin: Secondary | ICD-10-CM | POA: Diagnosis not present

## 2014-10-25 DIAGNOSIS — Z720 Tobacco use: Secondary | ICD-10-CM | POA: Diagnosis not present

## 2014-10-25 DIAGNOSIS — Z79899 Other long term (current) drug therapy: Secondary | ICD-10-CM | POA: Diagnosis not present

## 2014-10-25 DIAGNOSIS — L0231 Cutaneous abscess of buttock: Secondary | ICD-10-CM | POA: Diagnosis not present

## 2014-10-25 DIAGNOSIS — G894 Chronic pain syndrome: Secondary | ICD-10-CM | POA: Diagnosis not present

## 2014-10-25 MED ORDER — ONDANSETRON HCL 4 MG/2ML IJ SOLN
INTRAMUSCULAR | Status: AC
  Filled 2014-10-25: qty 2

## 2014-10-25 MED ORDER — ONDANSETRON HCL 4 MG/2ML IJ SOLN
4.0000 mg | Freq: Once | INTRAMUSCULAR | Status: AC
  Administered 2014-10-25: 4 mg via INTRAVENOUS

## 2014-10-25 MED ORDER — MORPHINE SULFATE 4 MG/ML IJ SOLN
4.0000 mg | Freq: Once | INTRAMUSCULAR | Status: AC
  Administered 2014-10-25: 4 mg via INTRAVENOUS
  Filled 2014-10-25: qty 1

## 2014-10-25 MED ORDER — DIPHENHYDRAMINE HCL 25 MG PO CAPS
50.0000 mg | ORAL_CAPSULE | Freq: Once | ORAL | Status: AC
  Administered 2014-10-25: 50 mg via ORAL

## 2014-10-25 MED ORDER — DIPHENHYDRAMINE HCL 25 MG PO CAPS
ORAL_CAPSULE | ORAL | Status: AC
  Filled 2014-10-25: qty 2

## 2014-10-25 MED ORDER — CLINDAMYCIN PHOSPHATE 600 MG/50ML IV SOLN
600.0000 mg | Freq: Once | INTRAVENOUS | Status: AC
  Administered 2014-10-25: 600 mg via INTRAVENOUS
  Filled 2014-10-25: qty 50

## 2014-10-25 NOTE — ED Provider Notes (Signed)
Spoke with Dr. Julien Nordmann, [place] who has accepted the patient in transfer for orthopedic evaluation. EMTALAl filled out. [facility] to transfer.  Merryl Hacker, MD 10/25/14 [phone]

## 2014-10-25 NOTE — ED Notes (Signed)
Patient verbalizes understanding plan of care and medical necessity to be transferred. Patient out of department to [facility] at this time with [facility]. Report called to [facility] ED Charge RN, Tammy.

## 2014-10-30 LAB — CULTURE, BLOOD (ROUTINE X 2)
Culture: NO GROWTH
Culture: NO GROWTH

## 2014-12-17 ENCOUNTER — Emergency Department (HOSPITAL_COMMUNITY)
Admission: EM | Admit: 2014-12-17 | Discharge: 2014-12-17 | Disposition: A | Discharge status: Home / Self Care | Payer: Medicaid Other | Attending: Emergency Medicine | Admitting: Emergency Medicine

## 2014-12-17 ENCOUNTER — Encounter (HOSPITAL_COMMUNITY): Payer: Self-pay | Admitting: Emergency Medicine

## 2014-12-17 DIAGNOSIS — Z8679 Personal history of other diseases of the circulatory system: Secondary | ICD-10-CM | POA: Insufficient documentation

## 2014-12-17 DIAGNOSIS — Z720 Tobacco use: Secondary | ICD-10-CM | POA: Diagnosis not present

## 2014-12-17 DIAGNOSIS — Z79899 Other long term (current) drug therapy: Secondary | ICD-10-CM | POA: Insufficient documentation

## 2014-12-17 DIAGNOSIS — Z8719 Personal history of other diseases of the digestive system: Secondary | ICD-10-CM | POA: Diagnosis not present

## 2014-12-17 DIAGNOSIS — R599 Enlarged lymph nodes, unspecified: Secondary | ICD-10-CM | POA: Insufficient documentation

## 2014-12-17 DIAGNOSIS — Z8739 Personal history of other diseases of the musculoskeletal system and connective tissue: Secondary | ICD-10-CM | POA: Insufficient documentation

## 2014-12-17 DIAGNOSIS — R609 Edema, unspecified: Secondary | ICD-10-CM

## 2014-12-17 DIAGNOSIS — Z880 Allergy status to penicillin: Secondary | ICD-10-CM | POA: Diagnosis not present

## 2014-12-17 DIAGNOSIS — R220 Localized swelling, mass and lump, head: Secondary | ICD-10-CM | POA: Diagnosis present

## 2014-12-17 DIAGNOSIS — G8929 Other chronic pain: Secondary | ICD-10-CM | POA: Diagnosis not present

## 2014-12-17 DIAGNOSIS — Z792 Long term (current) use of antibiotics: Secondary | ICD-10-CM | POA: Insufficient documentation

## 2014-12-17 LAB — COMPREHENSIVE METABOLIC PANEL
ALT: 9 U/L — ABNORMAL LOW (ref 14–54)
ANION GAP: 7 (ref 5–15)
AST: 15 U/L (ref 15–41)
Albumin: 3.9 g/dL (ref 3.5–5.0)
Alkaline Phosphatase: 70 U/L (ref 38–126)
BUN: 20 mg/dL (ref 6–20)
CALCIUM: 9.1 mg/dL (ref 8.9–10.3)
CO2: 27 mmol/L (ref 22–32)
CREATININE: 0.61 mg/dL (ref 0.44–1.00)
Chloride: 105 mmol/L (ref 101–111)
GFR calc Af Amer: >60 mL/min (ref >60)
GFR calc non Af Amer: >60 mL/min (ref >60)
GLUCOSE: 87 mg/dL (ref 65–99)
Potassium: 3.6 mmol/L (ref 3.5–5.1)
Sodium: 139 mmol/L (ref 135–145)
TOTAL PROTEIN: 7.6 g/dL (ref 6.5–8.1)
Total Bilirubin: 0.3 mg/dL (ref 0.3–1.2)

## 2014-12-17 LAB — CBC WITH DIFFERENTIAL/PLATELET
Basophils Absolute: 0 10*3/uL (ref 0.0–0.1)
Basophils Relative: 0 % (ref 0–1)
Eosinophils Absolute: 0.2 10*3/uL (ref 0.0–0.7)
Eosinophils Relative: 3 % (ref 0–5)
HCT: 37.4 % (ref 36.0–46.0)
Hemoglobin: 12.1 g/dL (ref 12.0–15.0)
Lymphocytes Relative: 40 % (ref 12–46)
Lymphs Abs: 2.9 10*3/uL (ref 0.7–4.0)
MCH: 30 pg (ref 26.0–34.0)
MCHC: 32.4 g/dL (ref 30.0–36.0)
MCV: 92.8 fL (ref 78.0–100.0)
Monocytes Absolute: 0.6 10*3/uL (ref 0.1–1.0)
Monocytes Relative: 8 % (ref 3–12)
Neutro Abs: 3.6 10*3/uL (ref 1.7–7.7)
Neutrophils Relative %: 49 % (ref 43–77)
Platelets: 192 10*3/uL (ref 150–400)
RBC: 4.03 MIL/uL (ref 3.87–5.11)
RDW: 13.9 % (ref 11.5–15.5)
WBC: 7.3 10*3/uL (ref 4.0–10.5)

## 2014-12-17 NOTE — ED Provider Notes (Signed)
CSN: [id number]     Arrival date & time 12/17/14  1520 History   First MD Initiated Contact with Patient 12/17/14 1651     Chief Complaint  Patient presents with  . Facial Swelling   HPI Patient complains of swelling in the submandibular region on the right side for the last 2 weeks. Area is mildly tender. Has not had any difficulty with eating or drinking but does know some discomfort.  She has not had any troubles with fevers. The area is slightly tender. She does not have any dental pain. She saw her primary doctor for this previous week. She was started on a course of antibiotics. The patient has not noticed much improvement. She is a smoker. There is a family history of cancer and she was concerned. Past Medical History  Diagnosis Date  . Arrhythmia   . Chronic pain syndrome   . IBS (irritable bowel syndrome)   . Chronic constipation   . Chronic pelvic pain in female   . Osteomyelitis     bilateral hips   Past Surgical History  Procedure Laterality Date  . Cesarean section    . Pelvic fracture surgery    . Tonsillectomy    . Tubal ligation    . Ovarian cyst removal Left   . Laparoscopic unilateral salpingo oopherectomy Left 10/11/2013    Procedure:  LAPAROSCOPIC LEFT SALPINGO OOPHORECTOMY;  Surgeon: [name], MD;  Location: [facility];  Service: Gynecology;  Laterality: Left;  . Bilateral salpingectomy Right 10/11/2013    Procedure: RIGHT SALPINGECTOMY;  Surgeon: [name], MD;  Location: [facility];  Service: Gynecology;  Laterality: Right;  . Diagnostic laparoscopy with removal of ectopic pregnancy N/A 10/15/2013    Procedure: DIAGNOSTIC LAPAROSCOPY ;  Surgeon: [name], MD;  Location: [facility];  Service: Gynecology;  Laterality: N/A;  . Cholecystectomy N/A 11/10/2013    Procedure: LAPAROSCOPIC CHOLECYSTECTOMY;  Surgeon: Scherry Ran, MD;  Location: [facility];  Service: General;  Laterality: N/A;  . Ercp N/A 11/18/2013    Procedure: ENDOSCOPIC RETROGRADE  CHOLANGIOPANCREATOGRAPHY (ERCP) WITH SMALL SPHINCTEROTOMY;  Surgeon: Rogene Houston, MD;  Location: [facility];  Service: Endoscopy;  Laterality: N/A;  . Ercp N/A 01/13/2014    Procedure: ENDOSCOPIC RETROGRADE CHOLANGIOPANCREATOGRAPHY (ERCP);  Surgeon: Rogene Houston, MD;  Location: [facility];  Service: Endoscopy;  Laterality: N/A;  . Stent removal N/A 01/13/2014    Procedure: STENT REMOVAL;  Surgeon: Rogene Houston, MD;  Location: [facility];  Service: Endoscopy;  Laterality: N/A;  . Sphincterotomy N/A 01/13/2014    Procedure: SPHINCTEROTOMY;  Surgeon: Rogene Houston, MD;  Location: [facility];  Service: Endoscopy;  Laterality: N/A;  . Fracture surgery    . Abdominal hysterectomy     Family History  Problem Relation Age of Onset  . Hypertension Mother    History  Substance Use Topics  . Smoking status: Current Every Day Smoker -- 0.50 packs/day for 2 years    Types: Cigarettes  . Smokeless tobacco: Never Used  . Alcohol Use: Yes     Comment: socially   OB History    Gravida Para Term Preterm AB TAB SAB Ectopic Multiple Living            2     Review of Systems  All other systems reviewed and are negative.     Allergies  Amoxicillin; Bee venom; Shellfish allergy; Vancomycin hcl; Iodine; Other; and Penicillins  Home Medications   Prior to Admission medications  Medication Sig Start Date End Date Taking? Authorizing Provider  acetaminophen (TYLENOL) 325 MG tablet Take 650 mg by mouth every 6 (six) hours.    Historical Provider, MD  ceFEPIme 1 g in dextrose 5 % 50 mL Inject 1 g into the vein every 8 (eight) hours. Patient not taking: Reported on 07/08/2014 04/24/14   Reyne Dumas, MD  ciprofloxacin (CIPRO) 500 MG tablet Take 1 tablet (500 mg total) by mouth 2 (two) times daily. 07/08/14   [name], MD  EPINEPHrine (EPIPEN) 0.3 mg/0.3 mL DEVI Inject 0.3 mg into the muscle once.    Historical Provider, MD  ferrous sulfate 325 (65 FE) MG EC tablet Take 325 mg by mouth daily with breakfast.     Historical Provider, MD  Linaclotide (LINZESS) 145 MCG CAPS capsule Take 1 capsule (145 mcg total) by mouth daily. 02/02/14   Rogene Houston, MD  oxyCODONE (ROXICODONE) 15 MG immediate release tablet Take 15 mg by mouth every 6 (six) hours as needed for pain.    Historical Provider, MD  oxyCODONE (ROXICODONE) 5 MG immediate release tablet Take 2 tablets (10 mg total) by mouth every 4 (four) hours as needed for severe pain. Patient not taking: Reported on 10/24/2014 07/08/14   [name], MD  pantoprazole (PROTONIX) 40 MG tablet Take 1 tablet (40 mg total) by mouth at bedtime. 11/25/13   [name], MD  polyethylene glycol (MIRALAX / GLYCOLAX) packet Take 17 g by mouth daily as needed for mild constipation. Patient not taking: Reported on 10/24/2014 04/24/14   Reyne Dumas, MD  promethazine (PHENERGAN) 25 MG tablet Take 1 tablet (25 mg total) by mouth every 6 (six) hours as needed. Patient taking differently: Take 25 mg by mouth every 6 (six) hours as needed for nausea.  07/08/14   [name], MD  sulfamethoxazole-trimethoprim (BACTRIM DS,SEPTRA DS) 800-160 MG per tablet Take 2 tablets by mouth 2 (two) times daily.    Historical Provider, MD  Vancomycin (VANCOCIN) 750 MG/150ML SOLN Inject 150 mLs (750 mg total) into the vein every 8 (eight) hours. Patient not taking: Reported on 07/08/2014 04/24/14   Reyne Dumas, MD   BP 118/73 mmHg  Pulse 107  Temp(Src) 99 F (37.2 C) (Oral)  Resp 18  Ht 5\' 5"  (1.651 m)  Wt 130 lb (58.968 kg)  BMI 21.63 kg/m2  SpO2 100%  LMP 10/03/2014 Physical Exam  Constitutional: She appears well-developed and well-nourished. No distress.  HENT:  Head: Normocephalic and atraumatic.  Right Ear: External ear normal.  Left Ear: External ear normal.  Mouth/Throat: No oropharyngeal exudate.  No oral lesions, no dental caries, no swelling noted intraorally  Eyes: Conjunctivae are normal. Right eye exhibits no discharge. Left eye exhibits no discharge. No scleral icterus.   Neck: Normal range of motion. Neck supple. No JVD present. No tracheal deviation present.  Small isolated proximally 1 cm area of submandibular lymph node or gland enlargement, the area is mobile, no fluctuance,  Cardiovascular: Normal rate, regular rhythm and normal heart sounds.   Pulmonary/Chest: Effort normal and breath sounds normal. No stridor. No respiratory distress.  Musculoskeletal: She exhibits no edema.  Neurological: She is alert. Cranial nerve deficit: no gross deficits.  Skin: Skin is warm and dry. No rash noted.  Psychiatric: She has a normal mood and affect.  Nursing note and vitals reviewed.   ED Course  Procedures (including critical care time) Labs Review Labs Reviewed  COMPREHENSIVE METABOLIC PANEL - Abnormal; Notable for the following:  ALT 9 (*)    All other components within normal limits  CBC WITH DIFFERENTIAL/PLATELET    Imaging Review No results found.   EKG Interpretation None      MDM   Final diagnoses:  Submandibular gland swelling   possibly a submandibular node or a submandibular gland. The patient does not have an abscess. There is no oral lesion. I recommend the patient continue supportive care. He is a submandibular node she could try sour candies to see if that helps at all. I did discuss follow-up care plan including biopsy if this node was to remain persistently enlarged. I did reassure her that that sometimes takes several weeks and there would be no immediate need to have that gland removed.    Dorie Rank, MD 12/17/14 [phone]

## 2014-12-17 NOTE — ED Notes (Signed)
PT c/o swollen lymph node to right lower jaw x2 weeks and states completing a zpack from her primary care physician for same. PT c/o feeling swelling to neck and worsening with swallowing today.

## 2014-12-17 NOTE — ED Notes (Signed)
Pt c/o swollen ?lymph node under right jaw area that started two weeks ago, pt reports that she was seen by pcp, given antibiotics with no improvement,  Pain is worse with when turning her neck area, denies any dental problems, any problems with swallowing.

## 2014-12-17 NOTE — ED Notes (Signed)
Patient c/o swelling to right submandibular gland. Per patient sore to touch but not a constant pain. Patient reports being seen by PCP on 5/4 for swelling and given an antibiotic in which she completed. Per patient swelling has increased. Denies any fevers. No difficult breathing or swallowing noted.

## 2015-02-23 ENCOUNTER — Other Ambulatory Visit (INDEPENDENT_AMBULATORY_CARE_PROVIDER_SITE_OTHER): Payer: Self-pay | Admitting: Internal Medicine

## 2015-02-26 ENCOUNTER — Encounter (INDEPENDENT_AMBULATORY_CARE_PROVIDER_SITE_OTHER): Payer: Self-pay | Admitting: *Deleted

## 2015-02-26 NOTE — Telephone Encounter (Signed)
Apt has been scheduled for 03/21/15 with Deberah Castle, NP.

## 2015-02-26 NOTE — Telephone Encounter (Signed)
Patient will need an office appointment prior to further refills. Last seen in the office 06-2014.

## 2015-03-21 ENCOUNTER — Encounter (INDEPENDENT_AMBULATORY_CARE_PROVIDER_SITE_OTHER): Payer: Self-pay | Admitting: Internal Medicine

## 2015-03-21 ENCOUNTER — Ambulatory Visit (INDEPENDENT_AMBULATORY_CARE_PROVIDER_SITE_OTHER): Payer: Medicaid Other | Admitting: Internal Medicine

## 2015-03-21 VITALS — BP 104/62 | HR 72 | Temp 97.7°F | Ht 64.0 in | Wt 142.6 lb

## 2015-03-21 DIAGNOSIS — K5909 Other constipation: Secondary | ICD-10-CM | POA: Diagnosis not present

## 2015-03-21 DIAGNOSIS — K929 Disease of digestive system, unspecified: Secondary | ICD-10-CM | POA: Diagnosis not present

## 2015-03-21 DIAGNOSIS — D62 Acute posthemorrhagic anemia: Secondary | ICD-10-CM | POA: Diagnosis not present

## 2015-03-21 DIAGNOSIS — K838 Other specified diseases of biliary tract: Secondary | ICD-10-CM

## 2015-03-21 DIAGNOSIS — K9189 Other postprocedural complications and disorders of digestive system: Secondary | ICD-10-CM

## 2015-03-21 DIAGNOSIS — Y832 Surgical operation with anastomosis, bypass or graft as the cause of abnormal reaction of the patient, or of later complication, without mention of misadventure at the time of the procedure: Secondary | ICD-10-CM

## 2015-03-21 LAB — CBC WITH DIFFERENTIAL/PLATELET
BASOS ABS: 0 10*3/uL (ref 0.0–0.1)
Basophils Relative: 0 % (ref 0–1)
Eosinophils Absolute: 0.1 10*3/uL (ref 0.0–0.7)
Eosinophils Relative: 1 % (ref 0–5)
HEMATOCRIT: 32.5 % — AB (ref 36.0–46.0)
Hemoglobin: 10.7 g/dL — ABNORMAL LOW (ref 12.0–15.0)
LYMPHS ABS: 2.7 10*3/uL (ref 0.7–4.0)
LYMPHS PCT: 29 % (ref 12–46)
MCH: 29.4 pg (ref 26.0–34.0)
MCHC: 32.9 g/dL (ref 30.0–36.0)
MCV: 89.3 fL (ref 78.0–100.0)
MPV: 11 fL (ref 8.6–12.4)
Monocytes Absolute: 0.5 10*3/uL (ref 0.1–1.0)
Monocytes Relative: 5 % (ref 3–12)
Neutro Abs: 6.1 10*3/uL (ref 1.7–7.7)
Neutrophils Relative %: 65 % (ref 43–77)
Platelets: 272 10*3/uL (ref 150–400)
RBC: 3.64 MIL/uL — ABNORMAL LOW (ref 3.87–5.11)
RDW: 15.4 % (ref 11.5–15.5)
WBC: 9.4 10*3/uL (ref 4.0–10.5)

## 2015-03-21 NOTE — Patient Instructions (Addendum)
Ov in 1 year.  
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
## 2015-03-22 LAB — HEPATIC FUNCTION PANEL
ALBUMIN: 4.2 g/dL (ref 3.6–5.1)
ALK PHOS: 72 U/L (ref 33–115)
ALT: 17 U/L (ref 6–29)
AST: 16 U/L (ref 10–30)
BILIRUBIN INDIRECT: 0.4 mg/dL (ref 0.2–1.2)
Bilirubin, Direct: 0.1 mg/dL (ref <=0.2)
Total Bilirubin: 0.5 mg/dL (ref 0.2–1.2)
Total Protein: 7.1 g/dL (ref 6.1–8.1)

## 2015-05-04 ENCOUNTER — Emergency Department (HOSPITAL_COMMUNITY): Payer: Medicaid Other

## 2015-05-04 ENCOUNTER — Emergency Department (HOSPITAL_COMMUNITY)
Admission: EM | Admit: 2015-05-04 | Discharge: 2015-05-05 | Disposition: A | Discharge status: Home / Self Care | Payer: Medicaid Other | Attending: Emergency Medicine | Admitting: Emergency Medicine

## 2015-05-04 ENCOUNTER — Encounter (HOSPITAL_COMMUNITY): Payer: Self-pay

## 2015-05-04 DIAGNOSIS — M25552 Pain in left hip: Secondary | ICD-10-CM

## 2015-05-04 DIAGNOSIS — Z8679 Personal history of other diseases of the circulatory system: Secondary | ICD-10-CM | POA: Insufficient documentation

## 2015-05-04 DIAGNOSIS — Z79899 Other long term (current) drug therapy: Secondary | ICD-10-CM | POA: Insufficient documentation

## 2015-05-04 DIAGNOSIS — M8668 Other chronic osteomyelitis, other site: Secondary | ICD-10-CM | POA: Insufficient documentation

## 2015-05-04 DIAGNOSIS — Z720 Tobacco use: Secondary | ICD-10-CM | POA: Diagnosis not present

## 2015-05-04 DIAGNOSIS — G8929 Other chronic pain: Secondary | ICD-10-CM | POA: Diagnosis not present

## 2015-05-04 DIAGNOSIS — K589 Irritable bowel syndrome without diarrhea: Secondary | ICD-10-CM | POA: Diagnosis not present

## 2015-05-04 DIAGNOSIS — Z880 Allergy status to penicillin: Secondary | ICD-10-CM | POA: Diagnosis not present

## 2015-05-04 NOTE — ED Notes (Signed)
Pt reports history of osteomyelitis in her hips, states she is scheduled to have surgery on the left hip in October.   Pt reports flare up of pain tonight
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
## 2015-05-05 ENCOUNTER — Emergency Department (HOSPITAL_COMMUNITY): Payer: Medicaid Other

## 2015-05-05 LAB — CBC WITH DIFFERENTIAL/PLATELET
BASOS ABS: 0 10*3/uL (ref 0.0–0.1)
Basophils Relative: 0 %
Eosinophils Absolute: 0.1 10*3/uL (ref 0.0–0.7)
Eosinophils Relative: 2 %
HCT: 32.1 % — ABNORMAL LOW (ref 36.0–46.0)
HEMOGLOBIN: 10.5 g/dL — AB (ref 12.0–15.0)
LYMPHS PCT: 42 %
Lymphs Abs: 3.1 10*3/uL (ref 0.7–4.0)
MCH: 29.6 pg (ref 26.0–34.0)
MCHC: 32.7 g/dL (ref 30.0–36.0)
MCV: 90.4 fL (ref 78.0–100.0)
Monocytes Absolute: 0.6 10*3/uL (ref 0.1–1.0)
Monocytes Relative: 8 %
NEUTROS PCT: 48 %
Neutro Abs: 3.5 10*3/uL (ref 1.7–7.7)
Platelets: 213 10*3/uL (ref 150–400)
RBC: 3.55 MIL/uL — AB (ref 3.87–5.11)
RDW: 13.8 % (ref 11.5–15.5)
WBC: 7.4 10*3/uL (ref 4.0–10.5)

## 2015-05-05 LAB — URINALYSIS, ROUTINE W REFLEX MICROSCOPIC
BILIRUBIN URINE: NEGATIVE
GLUCOSE, UA: NEGATIVE mg/dL
Hgb urine dipstick: NEGATIVE
LEUKOCYTES UA: NEGATIVE
NITRITE: NEGATIVE
Protein, ur: NEGATIVE mg/dL
SPECIFIC GRAVITY, URINE: 1.025 (ref 1.005–1.030)
Urobilinogen, UA: 0.2 mg/dL (ref 0.0–1.0)
pH: 6.5 (ref 5.0–8.0)

## 2015-05-05 LAB — BASIC METABOLIC PANEL
ANION GAP: 7 (ref 5–15)
BUN: 19 mg/dL (ref 6–20)
CO2: 28 mmol/L (ref 22–32)
Calcium: 8.6 mg/dL — ABNORMAL LOW (ref 8.9–10.3)
Chloride: 107 mmol/L (ref 101–111)
Creatinine, Ser: 0.75 mg/dL (ref 0.44–1.00)
GFR calc Af Amer: >60 mL/min (ref >60)
GFR calc non Af Amer: >60 mL/min (ref >60)
Glucose, Bld: 85 mg/dL (ref 65–99)
POTASSIUM: 3.5 mmol/L (ref 3.5–5.1)
Sodium: 142 mmol/L (ref 135–145)

## 2015-05-05 NOTE — Discharge Instructions (Signed)
You were seen today for fever and worsening left hip pain. Given your history of osteomyelitis, x-rays and basic labwork were obtained. Everything is reassuring. Blood cultures were obtained as well.  I discussed the workup with your infectious disease specialist. You need to follow-up in infectious disease clinic on Tuesday. Call on Monday for an appointment. Phone number provided in follow-up information.  If you have recurrence of fever, worsening pain or any new or worsening symptoms, you should be reevaluated. You should go to [facility] emergency room for evaluation given that your orthopedist and infectious disease specialist are there.

## 2015-05-05 NOTE — ED Notes (Signed)
Discharge instructions given, pt demonstrated teach back and verbal understanding. Pt stated she was not happy with outcome of visit, but had no questions. Stated that last time "my white count was normal and I still had an infection, I will just go to [facility] if it keeps hurting".

## 2015-05-07 LAB — URINE CULTURE

## 2015-05-10 LAB — CULTURE, BLOOD (ROUTINE X 2)
CULTURE: NO GROWTH
CULTURE: NO GROWTH

## 2015-05-25 ENCOUNTER — Ambulatory Visit (HOSPITAL_COMMUNITY)
Admission: RE | Admit: 2015-05-25 | Discharge: 2015-05-25 | Disposition: A | Discharge status: Home / Self Care | Payer: Medicaid Other | Source: Ambulatory Visit | Attending: Infectious Diseases | Admitting: Infectious Diseases

## 2015-05-25 ENCOUNTER — Emergency Department (HOSPITAL_COMMUNITY): Payer: Medicaid Other

## 2015-05-25 ENCOUNTER — Encounter (HOSPITAL_COMMUNITY): Payer: Self-pay | Admitting: Emergency Medicine

## 2015-05-25 ENCOUNTER — Emergency Department (HOSPITAL_COMMUNITY)
Admission: EM | Admit: 2015-05-25 | Discharge: 2015-05-25 | Disposition: A | Discharge status: Home / Self Care | Payer: Medicaid Other | Attending: Emergency Medicine | Admitting: Emergency Medicine

## 2015-05-25 DIAGNOSIS — Z792 Long term (current) use of antibiotics: Secondary | ICD-10-CM | POA: Insufficient documentation

## 2015-05-25 DIAGNOSIS — Z720 Tobacco use: Secondary | ICD-10-CM | POA: Diagnosis not present

## 2015-05-25 DIAGNOSIS — Z4582 Encounter for adjustment or removal of myringotomy device (stent) (tube): Secondary | ICD-10-CM | POA: Insufficient documentation

## 2015-05-25 DIAGNOSIS — T82898A Other specified complication of vascular prosthetic devices, implants and grafts, initial encounter: Secondary | ICD-10-CM | POA: Diagnosis present

## 2015-05-25 DIAGNOSIS — Z8679 Personal history of other diseases of the circulatory system: Secondary | ICD-10-CM | POA: Diagnosis not present

## 2015-05-25 DIAGNOSIS — Z880 Allergy status to penicillin: Secondary | ICD-10-CM | POA: Diagnosis not present

## 2015-05-25 DIAGNOSIS — Y712 Prosthetic and other implants, materials and accessory cardiovascular devices associated with adverse incidents: Secondary | ICD-10-CM | POA: Insufficient documentation

## 2015-05-25 DIAGNOSIS — Z79899 Other long term (current) drug therapy: Secondary | ICD-10-CM | POA: Insufficient documentation

## 2015-05-25 DIAGNOSIS — Z9851 Tubal ligation status: Secondary | ICD-10-CM | POA: Diagnosis not present

## 2015-05-25 DIAGNOSIS — M869 Osteomyelitis, unspecified: Secondary | ICD-10-CM | POA: Diagnosis not present

## 2015-05-25 DIAGNOSIS — K581 Irritable bowel syndrome with constipation: Secondary | ICD-10-CM | POA: Insufficient documentation

## 2015-05-25 DIAGNOSIS — Z452 Encounter for adjustment and management of vascular access device: Secondary | ICD-10-CM | POA: Diagnosis not present

## 2015-05-25 DIAGNOSIS — G894 Chronic pain syndrome: Secondary | ICD-10-CM | POA: Insufficient documentation

## 2015-05-25 DIAGNOSIS — Y829 Unspecified medical devices associated with adverse incidents: Secondary | ICD-10-CM | POA: Diagnosis not present

## 2015-05-25 MED ORDER — VANCOMYCIN HCL IN DEXTROSE 1-5 GM/200ML-% IV SOLN
1000.0000 mg | Freq: Two times a day (BID) | INTRAVENOUS | Status: DC
  Administered 2015-05-25: 1000 mg via INTRAVENOUS
  Filled 2015-05-25: qty 200

## 2015-05-25 MED ORDER — VANCOMYCIN HCL 1000 MG IV SOLR: 1000.0000 mg | Freq: Two times a day (BID) | INTRAVENOUS | Status: DC

## 2015-05-25 MED ORDER — DIPHENHYDRAMINE HCL 25 MG PO CAPS
25.0000 mg | ORAL_CAPSULE | Freq: Once | ORAL | Status: AC
  Administered 2015-05-25: 25 mg via ORAL
  Filled 2015-05-25: qty 1

## 2015-05-25 MED ORDER — OXYCODONE HCL 5 MG PO TABS
20.0000 mg | ORAL_TABLET | ORAL | Status: DC
  Administered 2015-05-25: 20 mg via ORAL
  Filled 2015-05-25: qty 4

## 2015-05-25 NOTE — ED Notes (Addendum)
Pt. reported that her PICC line came out this morning , she uses the it for IV antibiotic ( chronic osteomyelitis) .

## 2015-05-25 NOTE — Discharge Instructions (Signed)
Continue taking Keflex as prescribed. Return to [facility] radiology department at 9 AM to have a PICC line placed. Dr. Annamaria Boots is aware of your need to have a PICC line placed. Follow-up with your doctors at [facility] as needed. Return to the emergency department as needed if symptoms worsen.  PICC Insertion A peripherally inserted central catheter (PICC) is a long, thin, flexible tube that is inserted into a vein in the upper arm. It is a form of IV access. It is considered to be a "central" line because the tip of the PICC ends in a large vein in your chest. This large vein is called the superior vena cava (SVC). The PICC tip ends in the SVC because there is a lot of blood flow in the SVC. This allows medicines and IV fluids to be quickly distributed throughout the body. The PICC is inserted using a sterile technique by a specially trained health care provider. After the PICC is inserted, a chest X-ray may be done to ensure that it is in the correct place.  LET [facility] CARE PROVIDER KNOW ABOUT:   Any allergies you have.  All medicines you are taking, including vitamins, herbs, eye drops, creams, and over-the-counter medicines.  Previous problems you or members of your family have had with the use of anesthetics.  Any blood disorders you have.  Previous surgeries you have had.  Medical conditions you have. RISKS AND COMPLICATIONS Generally, this is a safe procedure. However, as with any procedure, complications can occur. Possible complications include:  Infection at the insertion site or in the blood.  Bleeding at the insertion site or internally.  Injury or collapse of the lung.  Movement or malposition of the PICC.  Inflammation of the vein (phlebitis).  Nerve injury or irritation.  Clot formation at the tip of the PICC line.  Blood clot in the lung (pulmonary embolus).  Injury to the large blood vessels or heart (rare). BEFORE THE PROCEDURE   Your health care provider may  want you to have blood tests. These tests can help tell how well your blood clots.  If you take blood thinners (anticoagulant medicine), ask your health care provider if you should stop taking them. PROCEDURE   You will have to lie flat for about 30-45 minutes for this procedure.  Your health care provider will start by identifying a vein into which the PICC line will be placed. This is done using ultrasound or X-ray guidance.  Medicine is used to numb the skin around the insertion site.  The skin where the catheter will be inserted is cleaned and covered with a sterile surgical drape.  A small needle is inserted into the vein, and then a small guidewire is advanced into the superior vena cava.  The catheter is then advanced over the guidewire and moved into position. The guidewire is then removed.  The catheter is flushed and blood is drawn back to confirm it is in the vein.  If this is done without X-ray guidance, an X-ray will be needed to make sure the catheter tip is in the correct position.  Once the placement is confirmed, the PICC is secured to the skin with a device and covered with a sterile dressing. AFTER THE PROCEDURE  You should avoid any strenuous activity for the next 2 days or as directed by your health care provider.  You will be allowed to go back to your regular activities after the procedure.  You will be instructed on the  care of your PICC line.   This information is not intended to replace advice given to you by your health care provider. Make sure you discuss any questions you have with your health care provider.   Document Released: 05/11/2013 Document Revised: 07/26/2013 Document Reviewed: 05/11/2013 Elsevier Interactive Patient Education [employer].

## 2015-05-25 NOTE — Progress Notes (Signed)
Attempted to contact pt regarding PICC placement. No answer at number provided and voice mail was not set up to leave a message.
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
## 2015-05-25 NOTE — ED Notes (Signed)
Humes, PA at bedside.  

## 2015-05-25 NOTE — ED Notes (Addendum)
Pt states that she thinks that part of her PICC line is still in her body. Pt states that she is 36cm cath, and the part that came out is only 33cm. The catheter also appears to be cut at an angle.

## 2015-05-26 ENCOUNTER — Other Ambulatory Visit (HOSPITAL_COMMUNITY): Payer: Self-pay | Admitting: Emergency Medicine

## 2015-05-26 ENCOUNTER — Ambulatory Visit (HOSPITAL_COMMUNITY)
Admission: RE | Admit: 2015-05-26 | Discharge: 2015-05-26 | Disposition: A | Discharge status: Home / Self Care | Payer: Medicaid Other | Source: Ambulatory Visit | Attending: Emergency Medicine | Admitting: Emergency Medicine

## 2015-05-26 DIAGNOSIS — B958 Unspecified staphylococcus as the cause of diseases classified elsewhere: Secondary | ICD-10-CM | POA: Insufficient documentation

## 2015-05-26 DIAGNOSIS — IMO0001 Reserved for inherently not codable concepts without codable children: Secondary | ICD-10-CM

## 2015-05-26 DIAGNOSIS — T814XXA Infection following a procedure, initial encounter: Principal | ICD-10-CM

## 2015-05-26 DIAGNOSIS — M00052 Staphylococcal arthritis, left hip: Secondary | ICD-10-CM | POA: Diagnosis not present

## 2015-05-26 DIAGNOSIS — B999 Unspecified infectious disease: Principal | ICD-10-CM

## 2015-05-26 DIAGNOSIS — M869 Osteomyelitis, unspecified: Secondary | ICD-10-CM | POA: Insufficient documentation

## 2015-05-26 MED ORDER — HEPARIN SOD (PORK) LOCK FLUSH 100 UNIT/ML IV SOLN
INTRAVENOUS | Status: AC
  Filled 2015-05-26: qty 5

## 2015-05-26 MED ORDER — LIDOCAINE HCL 1 % IJ SOLN
INTRAMUSCULAR | Status: AC
  Filled 2015-05-26: qty 20

## 2015-05-26 NOTE — Procedures (Signed)
Successful RUE SL POWER PICC TIP SVC/RA NO COMP STABLE READY FOR USE  

## 2015-06-12 ENCOUNTER — Inpatient Hospital Stay (HOSPITAL_COMMUNITY)
Admission: EM | Admit: 2015-06-12 | Discharge: 2015-06-13 | DRG: 872 | Disposition: A | Discharge status: Other Acute Inpatient Hospital | Payer: Medicaid Other | Attending: Internal Medicine | Admitting: Internal Medicine

## 2015-06-12 ENCOUNTER — Emergency Department (HOSPITAL_COMMUNITY): Payer: Medicaid Other

## 2015-06-12 ENCOUNTER — Encounter (HOSPITAL_COMMUNITY): Payer: Self-pay

## 2015-06-12 DIAGNOSIS — G894 Chronic pain syndrome: Secondary | ICD-10-CM | POA: Diagnosis present

## 2015-06-12 DIAGNOSIS — M545 Low back pain, unspecified: Secondary | ICD-10-CM | POA: Diagnosis present

## 2015-06-12 DIAGNOSIS — K3533 Acute appendicitis with perforation and localized peritonitis, with abscess: Secondary | ICD-10-CM

## 2015-06-12 DIAGNOSIS — D509 Iron deficiency anemia, unspecified: Secondary | ICD-10-CM | POA: Diagnosis present

## 2015-06-12 DIAGNOSIS — M868X9 Other osteomyelitis, unspecified sites: Secondary | ICD-10-CM

## 2015-06-12 DIAGNOSIS — K5909 Other constipation: Secondary | ICD-10-CM | POA: Diagnosis present

## 2015-06-12 DIAGNOSIS — T8140XA Infection following a procedure, unspecified, initial encounter: Secondary | ICD-10-CM

## 2015-06-12 DIAGNOSIS — F17210 Nicotine dependence, cigarettes, uncomplicated: Secondary | ICD-10-CM | POA: Diagnosis present

## 2015-06-12 DIAGNOSIS — Z8249 Family history of ischemic heart disease and other diseases of the circulatory system: Secondary | ICD-10-CM

## 2015-06-12 DIAGNOSIS — Z79891 Long term (current) use of opiate analgesic: Secondary | ICD-10-CM

## 2015-06-12 DIAGNOSIS — Z881 Allergy status to other antibiotic agents status: Secondary | ICD-10-CM

## 2015-06-12 DIAGNOSIS — M869 Osteomyelitis, unspecified: Secondary | ICD-10-CM | POA: Diagnosis present

## 2015-06-12 DIAGNOSIS — Z880 Allergy status to penicillin: Secondary | ICD-10-CM

## 2015-06-12 DIAGNOSIS — L02214 Cutaneous abscess of groin: Secondary | ICD-10-CM | POA: Diagnosis present

## 2015-06-12 DIAGNOSIS — R42 Dizziness and giddiness: Secondary | ICD-10-CM

## 2015-06-12 DIAGNOSIS — M86652 Other chronic osteomyelitis, left thigh: Secondary | ICD-10-CM | POA: Diagnosis not present

## 2015-06-12 DIAGNOSIS — K5900 Constipation, unspecified: Secondary | ICD-10-CM | POA: Diagnosis not present

## 2015-06-12 DIAGNOSIS — A419 Sepsis, unspecified organism: Principal | ICD-10-CM | POA: Diagnosis present

## 2015-06-12 DIAGNOSIS — K219 Gastro-esophageal reflux disease without esophagitis: Secondary | ICD-10-CM | POA: Diagnosis present

## 2015-06-12 DIAGNOSIS — R509 Fever, unspecified: Secondary | ICD-10-CM | POA: Diagnosis present

## 2015-06-12 DIAGNOSIS — E876 Hypokalemia: Secondary | ICD-10-CM | POA: Diagnosis present

## 2015-06-12 DIAGNOSIS — Z91013 Allergy to seafood: Secondary | ICD-10-CM

## 2015-06-12 DIAGNOSIS — R0602 Shortness of breath: Secondary | ICD-10-CM | POA: Diagnosis present

## 2015-06-12 DIAGNOSIS — Z91030 Bee allergy status: Secondary | ICD-10-CM

## 2015-06-12 DIAGNOSIS — I959 Hypotension, unspecified: Secondary | ICD-10-CM | POA: Diagnosis present

## 2015-06-12 HISTORY — DX: Other osteomyelitis, unspecified sites: M86.8X9

## 2015-06-12 LAB — URINALYSIS, ROUTINE W REFLEX MICROSCOPIC
BILIRUBIN URINE: NEGATIVE
Glucose, UA: NEGATIVE mg/dL
Hgb urine dipstick: NEGATIVE
Ketones, ur: NEGATIVE mg/dL
LEUKOCYTES UA: NEGATIVE
NITRITE: NEGATIVE
Protein, ur: NEGATIVE mg/dL
SPECIFIC GRAVITY, URINE: 1.01 (ref 1.005–1.030)
UROBILINOGEN UA: 0.2 mg/dL (ref 0.0–1.0)
pH: 6 (ref 5.0–8.0)

## 2015-06-12 LAB — CBC WITH DIFFERENTIAL/PLATELET
BASOS ABS: 0 10*3/uL (ref 0.0–0.1)
BASOS PCT: 0 %
Eosinophils Absolute: 0.1 10*3/uL (ref 0.0–0.7)
Eosinophils Relative: 1 %
HEMATOCRIT: 26.3 % — AB (ref 36.0–46.0)
HEMOGLOBIN: 8.7 g/dL — AB (ref 12.0–15.0)
LYMPHS PCT: 13 %
Lymphs Abs: 1.2 10*3/uL (ref 0.7–4.0)
MCH: 27.9 pg (ref 26.0–34.0)
MCHC: 33.1 g/dL (ref 30.0–36.0)
MCV: 84.3 fL (ref 78.0–100.0)
Monocytes Absolute: 0.6 10*3/uL (ref 0.1–1.0)
Monocytes Relative: 7 %
NEUTROS ABS: 7.3 10*3/uL (ref 1.7–7.7)
NEUTROS PCT: 79 %
Platelets: 146 10*3/uL — ABNORMAL LOW (ref 150–400)
RBC: 3.12 MIL/uL — AB (ref 3.87–5.11)
RDW: 13.8 % (ref 11.5–15.5)
WBC: 9.1 10*3/uL (ref 4.0–10.5)

## 2015-06-12 LAB — COMPREHENSIVE METABOLIC PANEL
ALBUMIN: 3.6 g/dL (ref 3.5–5.0)
ALK PHOS: 64 U/L (ref 38–126)
ALT: 9 U/L — ABNORMAL LOW (ref 14–54)
ANION GAP: 10 (ref 5–15)
AST: 18 U/L (ref 15–41)
BILIRUBIN TOTAL: 0.8 mg/dL (ref 0.3–1.2)
BUN: 17 mg/dL (ref 6–20)
CALCIUM: 8.4 mg/dL — AB (ref 8.9–10.3)
CO2: 22 mmol/L (ref 22–32)
Chloride: 106 mmol/L (ref 101–111)
Creatinine, Ser: 0.51 mg/dL (ref 0.44–1.00)
GFR calc non Af Amer: >60 mL/min (ref >60)
Glucose, Bld: 112 mg/dL — ABNORMAL HIGH (ref 65–99)
POTASSIUM: 3.5 mmol/L (ref 3.5–5.1)
SODIUM: 138 mmol/L (ref 135–145)
TOTAL PROTEIN: 7.1 g/dL (ref 6.5–8.1)

## 2015-06-12 LAB — LACTIC ACID, PLASMA: LACTIC ACID, VENOUS: 0.6 mmol/L (ref 0.5–2.0)

## 2015-06-12 MED ORDER — ACETAMINOPHEN 500 MG PO TABS
1000.0000 mg | ORAL_TABLET | Freq: Once | ORAL | Status: AC
  Administered 2015-06-12: 1000 mg via ORAL
  Filled 2015-06-12: qty 2

## 2015-06-12 MED ORDER — FENTANYL CITRATE (PF) 100 MCG/2ML IJ SOLN
50.0000 ug | INTRAMUSCULAR | Status: AC | PRN
  Administered 2015-06-12 – 2015-06-13 (×2): 50 ug via INTRAVENOUS
  Filled 2015-06-12 (×2): qty 2

## 2015-06-12 MED ORDER — DIPHENHYDRAMINE HCL 50 MG/ML IJ SOLN
INTRAMUSCULAR | Status: AC
  Filled 2015-06-12: qty 1

## 2015-06-12 MED ORDER — SODIUM CHLORIDE 0.9 % IV SOLN
1000.0000 mL | INTRAVENOUS | Status: DC
  Administered 2015-06-12 – 2015-06-13 (×2): 1000 mL via INTRAVENOUS

## 2015-06-12 MED ORDER — DIPHENHYDRAMINE HCL 50 MG/ML IJ SOLN
25.0000 mg | Freq: Once | INTRAMUSCULAR | Status: AC
  Administered 2015-06-12: 25 mg via INTRAVENOUS

## 2015-06-12 MED ORDER — SODIUM CHLORIDE 0.9 % IJ SOLN
INTRAMUSCULAR | Status: AC
  Filled 2015-06-12: qty 1000

## 2015-06-12 MED ORDER — SODIUM CHLORIDE 0.9 % IV BOLUS (SEPSIS)
1000.0000 mL | INTRAVENOUS | Status: AC
  Administered 2015-06-12 (×2): 1000 mL via INTRAVENOUS

## 2015-06-12 MED ORDER — DEXTROSE 5 % IV SOLN
2.0000 g | Freq: Once | INTRAVENOUS | Status: AC
  Administered 2015-06-12: 2 g via INTRAVENOUS
  Filled 2015-06-12: qty 2

## 2015-06-12 MED ORDER — IOHEXOL 300 MG/ML  SOLN
100.0000 mL | Freq: Once | INTRAMUSCULAR | Status: AC | PRN
  Administered 2015-06-13: 100 mL via INTRAVENOUS

## 2015-06-12 MED ORDER — LEVOFLOXACIN IN D5W 750 MG/150ML IV SOLN
750.0000 mg | Freq: Once | INTRAVENOUS | Status: AC
Start: 2015-06-12 — End: 2015-06-12
  Administered 2015-06-12: 750 mg via INTRAVENOUS
  Filled 2015-06-12: qty 150

## 2015-06-12 MED ORDER — SODIUM CHLORIDE 0.9 % IJ SOLN
INTRAMUSCULAR | Status: AC
  Filled 2015-06-12: qty 60

## 2015-06-12 MED ORDER — VANCOMYCIN HCL IN DEXTROSE 1-5 GM/200ML-% IV SOLN
1000.0000 mg | Freq: Once | INTRAVENOUS | Status: AC
  Administered 2015-06-12: 1000 mg via INTRAVENOUS
  Filled 2015-06-12: qty 200

## 2015-06-12 NOTE — ED Notes (Signed)
Patient states this evening she began to have a fever, increased blood pressure, and difficulty breathing. Patient states "i think im getting septic" patient has PICC line to right upper arm with recent surgery.

## 2015-06-12 NOTE — ED Notes (Signed)
Thus far unable to obtain IV access. Mult tries by RNs.

## 2015-06-12 NOTE — ED Provider Notes (Signed)
CSN: [id number]     Arrival date & time 06/12/15  2054 History   First MD Initiated Contact with Patient 06/12/15 2131     Chief Complaint  Patient presents with  . Fever     HPI Pt was seen at 2135.  Per pt, c/o sudden onset and persistence of constant "fever and chills" that began PTA. Pt states she was sitting at home when her symptoms began. Pt states she has hx of chronic left hip pain, abscesses, and osteomyelitis, and is currently on IV vancomycin. Pt states she "thinks I'm septic again." Denies CP/SOB, no cough, no sore throat, no abd pain, no N/V/D.     Past Medical History  Diagnosis Date  . Arrhythmia   . Chronic pain syndrome   . IBS (irritable bowel syndrome)   . Chronic constipation   . Chronic pelvic pain in female   . Osteomyelitis ([place])     bilateral hips   Past Surgical History  Procedure Laterality Date  . Cesarean section    . Pelvic fracture surgery    . Tonsillectomy    . Tubal ligation    . Ovarian cyst removal Left   . Laparoscopic unilateral salpingo oopherectomy Left 10/11/2013    Procedure:  LAPAROSCOPIC LEFT SALPINGO OOPHORECTOMY;  Surgeon: [name], MD;  Location: [facility];  Service: Gynecology;  Laterality: Left;  . Bilateral salpingectomy Right 10/11/2013    Procedure: RIGHT SALPINGECTOMY;  Surgeon: [name], MD;  Location: [facility];  Service: Gynecology;  Laterality: Right;  . Diagnostic laparoscopy with removal of ectopic pregnancy N/A 10/15/2013    Procedure: DIAGNOSTIC LAPAROSCOPY ;  Surgeon: [name], MD;  Location: [facility];  Service: Gynecology;  Laterality: N/A;  . Cholecystectomy N/A 11/10/2013    Procedure: LAPAROSCOPIC CHOLECYSTECTOMY;  Surgeon: Scherry Ran, MD;  Location: [facility];  Service: General;  Laterality: N/A;  . Ercp N/A 11/18/2013    Procedure: ENDOSCOPIC RETROGRADE CHOLANGIOPANCREATOGRAPHY (ERCP) WITH SMALL SPHINCTEROTOMY;  Surgeon: Rogene Houston, MD;  Location: [facility];  Service: Endoscopy;  Laterality: N/A;   . Ercp N/A 01/13/2014    Procedure: ENDOSCOPIC RETROGRADE CHOLANGIOPANCREATOGRAPHY (ERCP);  Surgeon: Rogene Houston, MD;  Location: [facility];  Service: Endoscopy;  Laterality: N/A;  . Stent removal N/A 01/13/2014    Procedure: STENT REMOVAL;  Surgeon: Rogene Houston, MD;  Location: [facility];  Service: Endoscopy;  Laterality: N/A;  . Sphincterotomy N/A 01/13/2014    Procedure: SPHINCTEROTOMY;  Surgeon: Rogene Houston, MD;  Location: [facility];  Service: Endoscopy;  Laterality: N/A;  . Fracture surgery    . Abdominal hysterectomy     Family History  Problem Relation Age of Onset  . Hypertension Mother    Social History  Substance Use Topics  . Smoking status: Current Every Day Smoker -- 0.00 packs/day for 2 years    Types: Cigarettes  . Smokeless tobacco: Never Used     Comment: on a nicotine patch  . Alcohol Use: Yes     Comment: socially    Review of Systems ROS: Statement: All systems negative except as marked or noted in the HPI; Constitutional: +fever and chills. ; ; Eyes: Negative for eye pain, redness and discharge. ; ; ENMT: Negative for ear pain, hoarseness, nasal congestion, sinus pressure and sore throat. ; ; Cardiovascular: Negative for chest pain, palpitations, diaphoresis, dyspnea and peripheral edema. ; ; Respiratory: Negative for cough, wheezing and stridor. ; ; Gastrointestinal: Negative for nausea, vomiting, diarrhea, abdominal pain,  blood in stool, hematemesis, jaundice and rectal bleeding. . ; ; Genitourinary: Negative for dysuria, flank pain and hematuria. ; ; Musculoskeletal: +left hip pain. Negative for back pain and neck pain. Negative for swelling and trauma.; ; Skin: Negative for pruritus, rash, abrasions, blisters, bruising and skin lesion.; ; Neuro: Negative for headache, lightheadedness and neck stiffness. Negative for weakness, altered level of consciousness , altered mental status, extremity weakness, paresthesias, involuntary movement, seizure and syncope.       Allergies  Amoxicillin; Bee venom; Shellfish allergy; Vancomycin hcl; Ketamine; Gabapentin; Iodine; Other; and Penicillins  Home Medications   Prior to Admission medications   Medication Sig Start Date End Date Taking? Authorizing Provider  EPINEPHrine (EPIPEN) 0.3 mg/0.3 mL DEVI Inject 0.3 mg into the muscle once.   Yes Historical Provider, MD  ferrous sulfate 325 (65 FE) MG EC tablet Take 325 mg by mouth daily with breakfast.   Yes Historical Provider, MD  ibuprofen (ADVIL,MOTRIN) 200 MG tablet Take 800 mg by mouth every 6 (six) hours as needed for moderate pain.    Yes Historical Provider, MD  LINZESS 145 MCG CAPS capsule TAKE 1 CAPSULE BY MOUTH ONCE DAILY. 02/26/15  Yes Rogene Houston, MD  meloxicam (MOBIC) 7.5 MG tablet Take 7.5 mg by mouth daily. 05/17/15 05/16/16 Yes Historical Provider, MD  oxyCODONE (ROXICODONE) 5 MG immediate release tablet Take 2 tablets (10 mg total) by mouth every 4 (four) hours as needed for severe pain. Patient taking differently: Take 5 mg by mouth every 3 (three) hours.  07/08/14  Yes [name], MD  Oxycodone HCl 20 MG TABS Take 1 tablet by mouth every 4 (four) hours. Take every day per patient   Yes Historical Provider, MD  pantoprazole (PROTONIX) 40 MG tablet Take 40 mg by mouth daily.   Yes Historical Provider, MD  vancomycin 1,000 mg in sodium chloride 0.9 % 250 mL Inject 1,000 mg into the vein every 12 (twelve) hours.   Yes Historical Provider, MD   BP 103/76 mmHg  Pulse 97  Temp(Src) 101.7 F (38.7 C) (Rectal)  Resp 11  Ht 5\' 5"  (1.651 m)  Wt 135 lb (61.236 kg)  BMI 22.47 kg/m2  SpO2 100%  LMP  (Approximate)   Patient Vitals for the past 24 hrs:  BP Temp Temp src Pulse Resp SpO2 Height Weight  06/12/15 2330 103/76 mmHg - - 97 11 100 % - -  06/12/15 2231 103/67 mmHg - - 109 16 99 % - -  06/12/15 2231 - 101.7 F (38.7 C) Rectal - - - - -  06/12/15 2103 130/72 mmHg 100.4 F (38 C) Oral 117 22 99 % 5\' 5"  (1.651 m) 135 lb (61.236 kg)      Physical Exam  2140: Physical examination:  Nursing notes reviewed; Vital signs and O2 SAT reviewed; +febrile.;; Constitutional: Well developed, Well nourished, Well hydrated, In no acute distress; Head:  Normocephalic, atraumatic; Eyes: EOMI, PERRL, No scleral icterus; ENMT: Mouth and pharynx normal, Mucous membranes moist; Neck: Supple, Full range of motion, No lymphadenopathy; Cardiovascular: Tachycardic rate and rhythm, No gallop; Respiratory: Breath sounds clear & equal bilaterally, No wheezes.  Speaking full sentences with ease, Normal respiratory effort/excursion; Chest: Nontender, Movement normal; Abdomen: Soft, Nontender, Nondistended, Normal bowel sounds; Genitourinary: No CVA tenderness; Extremities: Pulses normal, No deformity. No edema, No calf edema or asymmetry. +RUE PICC line: mild erythema at insertion site and well as lateral to tegaderm. No streaking, no drainage, no ecchymosis.; Neuro: AA&Ox3, Major CN grossly intact.  Speech clear. No gross focal motor or sensory deficits in extremities.; Skin: Color normal, Warm, Dry.    ED Course  Procedures (including critical care time) Labs Review   Imaging Review  I have personally reviewed and evaluated these images and lab results as part of my medical decision-making.   EKG Interpretation   Date/Time:  Tuesday June 12 2015 21:37:44 EST Ventricular Rate:  114 PR Interval:  149 QRS Duration: 86 QT Interval:  346 QTC Calculation: 476 R Axis:   72 Text Interpretation:  Sinus tachycardia Borderline prolonged QT interval  No old tracing to compare Confirmed by [facility]  MD, Nunzio Cory [phone]) on  06/12/2015 9:44:37 PM      MDM  MDM Reviewed: previous chart, nursing note and vitals Reviewed previous: labs, x-ray and ECG Interpretation: labs, x-ray and ECG Total time providing critical care: 30-74 minutes. This excludes time spent performing separately reportable procedures and services. Consults: admitting  MD     CRITICAL CARE Performed by: Alfonzo Feller Total critical care time: 35 minutes Critical care time was exclusive of separately billable procedures and treating other patients. Critical care was necessary to treat or prevent imminent or life-threatening deterioration. Critical care was time spent personally by me on the following activities: development of treatment plan with patient and/or surrogate as well as nursing, discussions with consultants, evaluation of patient's response to treatment, examination of patient, obtaining history from patient or surrogate, ordering and performing treatments and interventions, ordering and review of laboratory studies, ordering and review of radiographic studies, pulse oximetry and re-evaluation of patient's condition.   Results for orders placed or performed during the hospital encounter of 06/12/15  Comprehensive metabolic panel  Result Value Ref Range   Sodium 138 135 - 145 mmol/L   Potassium 3.5 3.5 - 5.1 mmol/L   Chloride 106 101 - 111 mmol/L   CO2 22 22 - 32 mmol/L   Glucose, Bld 112 (H) 65 - 99 mg/dL   BUN 17 6 - 20 mg/dL   Creatinine, Ser 0.51 0.44 - 1.00 mg/dL   Calcium 8.4 (L) 8.9 - 10.3 mg/dL   Total Protein 7.1 6.5 - 8.1 g/dL   Albumin 3.6 3.5 - 5.0 g/dL   AST 18 15 - 41 U/L   ALT 9 (L) 14 - 54 U/L   Alkaline Phosphatase 64 38 - 126 U/L   Total Bilirubin 0.8 0.3 - 1.2 mg/dL   GFR calc non Af Amer >60 >60 mL/min   GFR calc Af Amer >60 >60 mL/min   Anion gap 10 5 - 15  CBC WITH DIFFERENTIAL  Result Value Ref Range   WBC 9.1 4.0 - 10.5 K/uL   RBC 3.12 (L) 3.87 - 5.11 MIL/uL   Hemoglobin 8.7 (L) 12.0 - 15.0 g/dL   HCT 26.3 (L) 36.0 - 46.0 %   MCV 84.3 78.0 - 100.0 fL   MCH 27.9 26.0 - 34.0 pg   MCHC 33.1 30.0 - 36.0 g/dL   RDW 13.8 11.5 - 15.5 %   Platelets 146 (L) 150 - 400 K/uL   Neutrophils Relative % 79 %   Neutro Abs 7.3 1.7 - 7.7 K/uL   Lymphocytes Relative 13 %   Lymphs Abs 1.2 0.7 - 4.0 K/uL   Monocytes  Relative 7 %   Monocytes Absolute 0.6 0.1 - 1.0 K/uL   Eosinophils Relative 1 %   Eosinophils Absolute 0.1 0.0 - 0.7 K/uL   Basophils Relative 0 %   Basophils Absolute 0.0 0.0 -  0.1 K/uL  Urinalysis, Routine w reflex microscopic (not at [facility])  Result Value Ref Range   Color, Urine YELLOW YELLOW   APPearance CLEAR CLEAR   Specific Gravity, Urine 1.010 1.005 - 1.030   pH 6.0 5.0 - 8.0   Glucose, UA NEGATIVE NEGATIVE mg/dL   Hgb urine dipstick NEGATIVE NEGATIVE   Bilirubin Urine NEGATIVE NEGATIVE   Ketones, ur NEGATIVE NEGATIVE mg/dL   Protein, ur NEGATIVE NEGATIVE mg/dL   Urobilinogen, UA 0.2 0.0 - 1.0 mg/dL   Nitrite NEGATIVE NEGATIVE   Leukocytes, UA NEGATIVE NEGATIVE  Lactic acid, plasma  Result Value Ref Range   Lactic Acid, Venous 0.6 0.5 - 2.0 mmol/L    Dg Chest Port 1 View 06/12/2015  CLINICAL DATA:  Fever EXAM: PORTABLE CHEST 1 VIEW COMPARISON:  05/25/2015 FINDINGS: Right upper extremity PICC, tip at the lower SVC. Normal heart size and mediastinal contours. No acute infiltrate or edema. No effusion or pneumothorax. No acute osseous findings. IMPRESSION: Negative portable chest. Electronically Signed   By: Monte Fantasia M.D.   On: 06/12/2015 23:19    2355:  Pt's BP dropped after arrival. Code Sepsis called, protocol IVF and IV abx started. Pt's BP appears to be near her baseline per EPIC chart review. Will obtain CT left hip, as pt has hx of abscess and osteomyelitis in this area. Dx and testing d/w pt and family.  Questions answered.  Verb understanding, agreeable to admit.  T/C to [facility] Dr. Blaine Hamper, case discussed, including:  HPI, pertinent PM/SHx, VS/PE, dx testing, ED course and treatment:  Agreeable to admit, requests to write temporary orders, obtain tele bed to team [facility].    Francine Graven, DO 06/14/15 2334

## 2015-06-12 NOTE — ED Notes (Signed)
Unable to obtain blood for I-stat Lactic.

## 2015-06-12 NOTE — ED Notes (Signed)
Lab contacted for lactic acid draw.

## 2015-06-12 NOTE — Progress Notes (Signed)
Pharmacy Note:  Initial antibiotics for Vancomycin, Levaquin and Aztreionam ordered by EDP for Sepsis.  CrCl cannot be calculated (Patient has no serum creatinine result on file.).   Allergies  Allergen Reactions  . Amoxicillin Anaphylaxis, Hives and Rash    Able to take Ancef as prophylaxis without reaction  . Bee Venom Anaphylaxis  . Shellfish Allergy Anaphylaxis  . Vancomycin Hcl [Vancomycin] Itching    Can take it if given Benadryl 30 minutes prior to Vancomycin  . Iodine Rash    Topical form only  . Other Rash    GRASS  . Penicillins Hives and Rash    Has patient had a PCN reaction causing immediate rash, facial/tongue/throat swelling, SOB or lightheadedness with hypotension: YES  Has patient had a PCN reaction causing severe rash involving mucus membranes or skin necrosis: NO Has patient had a PCN reaction that required hospitalization NO Has patient had a PCN reaction occurring within the last 10 years: NO If all of the above answers are "NO", then may proceed with Cephalosporin use.     Filed Vitals:   06/12/15 2103  BP: 130/72  Pulse: 117  Temp: 100.4 F (38 C)  Resp: 22    Anti-infectives    Start     Dose/Rate Route Frequency Ordered Stop   06/12/15 2145  levofloxacin (LEVAQUIN) IVPB 750 mg     750 mg 100 mL/hr over 90 Minutes Intravenous  Once 06/12/15 2144     06/12/15 2145  aztreonam (AZACTAM) 2 g in dextrose 5 % 50 mL IVPB     2 g 100 mL/hr over 30 Minutes Intravenous  Once 06/12/15 2144     06/12/15 2145  vancomycin (VANCOCIN) IVPB 1000 mg/200 mL premix     1,000 mg 200 mL/hr over 60 Minutes Intravenous  Once 06/12/15 2144        Plan: Initial doses of Vancomycin, Levaquin and Aztreonam X 1 ordered. F/U admission orders for further dosing if therapy continued.  Pricilla Larsson, [facility] 06/12/2015 9:49 PM

## 2015-06-12 NOTE — H&P (Signed)
[facility] History and Physical  Catherine Ramirez DVV:[id number] DOB: September 26, 1985 DOA: 06/12/2015  Referring physician: ED physician PCP: [name], MD  Specialists:   Chief Complaint: Fever, shortness of breath, dizziness, left hip pain and redness surrounding the picc line site  HPI: Catherine Ramirez is a 29 y.o. female with PMH of chronic pain syndrome, IBS, chronic constipation, chronic pelvis pain, osteomyelitis of bilateral of hips, GERD, anemia, who presents with fever, shortness of breath, dizziness, left hip pain and redness surrounding the picc line site  Patient has hx of bilateral iliac wing osteomyelitis, had I&D R hip debridement on 02/26/15 and I & D of Left hip on  05/14/15 by Dr. Cleopatra Cedar in [place]. She was started IV vancomycin per ID, Dr. Russ Halo on 05/14/15 (suposed to take for 6 weeks). She reports that she missed 5 dose 2 weeks ago because of her PICC line falloff, then restarted IV vanco after PICC line was replaced to her R upper arm. She reports that she missed 2 dose yesterday.  She states that she developed fever, shortness of breath and dizziness, and has worsening left hip pain today. Her shortness started at 8 PM, no cough or chest pain. No tenderness over calf area. She also noticed redness over right arm around PICC line sites. She has mild tenderness over PICC insertion site. Patient has nausea, but no vomiting, abdominal pain, diarrhea, symptoms of a UTI, unilateral weakness.  In ED, patient was found to have lactate 0.6, WBC 9.1, temperature 101.7, tachycardia, tachypnea, negative urinalysis. Negative chest x-ray. Patient's admitted to inpatient for further intervention and treatment.   Where does patient live?   At home    Can patient participate in ADLs?   Some   Review of Systems:   General: has fevers, chills, no changes in body weight, has poor appetite, has fatigue HEENT: no blurry vision, hearing changes or sore throat Pulm: has dyspnea, no  coughing, wheezing CV: no chest pain, palpitations Abd: has nausea, no vomiting, abdominal pain, diarrhea, constipation GU: no dysuria, burning on urination, increased urinary frequency, hematuria  Ext: no leg edema Neuro: no unilateral weakness, numbness, or tingling, no vision change or hearing loss. Has dizziness. Skin: no rash MSK: No muscle spasm, no deformity, no limitation of range of movement in spin. Has left hip pain. Heme: No easy bruising.  Travel history: No recent long distant travel.  Allergy:  Allergies  Allergen Reactions  . Amoxicillin Anaphylaxis, Hives and Rash    Able to take Ancef as prophylaxis without reaction  . Bee Venom Anaphylaxis  . Shellfish Allergy Anaphylaxis  . Vancomycin Hcl [Vancomycin] Itching    Can take it if given Benadryl 30 minutes prior to Vancomycin  . Ketamine Other (See Comments)    Caused confusion and patient felt "high, like my face was going to melt off" Caused confusion and patient felt "high, like my face was going to melt off"  . Gabapentin Palpitations  . Iodine Rash    Topical form only  . Other Rash    GRASS  . Penicillins Hives and Rash    Has patient had a PCN reaction causing immediate rash, facial/tongue/throat swelling, SOB or lightheadedness with hypotension: YES  Has patient had a PCN reaction causing severe rash involving mucus membranes or skin necrosis: NO Has patient had a PCN reaction that required hospitalization NO Has patient had a PCN reaction occurring within the last 10 years: NO If all of the above answers are "NO", then may  proceed with Cephalosporin use.     Past Medical History  Diagnosis Date  . Arrhythmia   . Chronic pain syndrome   . IBS (irritable bowel syndrome)   . Chronic constipation   . Chronic pelvic pain in female   . Osteomyelitis ([place])     bilateral hips    Past Surgical History  Procedure Laterality Date  . Cesarean section    . Pelvic fracture surgery    . Tonsillectomy     . Tubal ligation    . Ovarian cyst removal Left   . Laparoscopic unilateral salpingo oopherectomy Left 10/11/2013    Procedure:  LAPAROSCOPIC LEFT SALPINGO OOPHORECTOMY;  Surgeon: [name], MD;  Location: [facility];  Service: Gynecology;  Laterality: Left;  . Bilateral salpingectomy Right 10/11/2013    Procedure: RIGHT SALPINGECTOMY;  Surgeon: [name], MD;  Location: [facility];  Service: Gynecology;  Laterality: Right;  . Diagnostic laparoscopy with removal of ectopic pregnancy N/A 10/15/2013    Procedure: DIAGNOSTIC LAPAROSCOPY ;  Surgeon: [name], MD;  Location: [facility];  Service: Gynecology;  Laterality: N/A;  . Cholecystectomy N/A 11/10/2013    Procedure: LAPAROSCOPIC CHOLECYSTECTOMY;  Surgeon: Scherry Ran, MD;  Location: [facility];  Service: General;  Laterality: N/A;  . Ercp N/A 11/18/2013    Procedure: ENDOSCOPIC RETROGRADE CHOLANGIOPANCREATOGRAPHY (ERCP) WITH SMALL SPHINCTEROTOMY;  Surgeon: Rogene Houston, MD;  Location: [facility];  Service: Endoscopy;  Laterality: N/A;  . Ercp N/A 01/13/2014    Procedure: ENDOSCOPIC RETROGRADE CHOLANGIOPANCREATOGRAPHY (ERCP);  Surgeon: Rogene Houston, MD;  Location: [facility];  Service: Endoscopy;  Laterality: N/A;  . Stent removal N/A 01/13/2014    Procedure: STENT REMOVAL;  Surgeon: Rogene Houston, MD;  Location: [facility];  Service: Endoscopy;  Laterality: N/A;  . Sphincterotomy N/A 01/13/2014    Procedure: SPHINCTEROTOMY;  Surgeon: Rogene Houston, MD;  Location: [facility];  Service: Endoscopy;  Laterality: N/A;  . Fracture surgery    . Abdominal hysterectomy      Social History:  reports that she has been smoking Cigarettes.  She has been smoking about 0.00 packs per day for the past 2 years. She uses smokeless tobacco. She reports that she drinks alcohol. She reports that she does not use illicit drugs.  Family History:  Family History  Problem Relation Age of Onset  . Hypertension Mother      Prior to Admission medications    Medication Sig Start Date End Date Taking? Authorizing Provider  EPINEPHrine (EPIPEN) 0.3 mg/0.3 mL DEVI Inject 0.3 mg into the muscle once.   Yes Historical Provider, MD  ferrous sulfate 325 (65 FE) MG EC tablet Take 325 mg by mouth daily with breakfast.   Yes Historical Provider, MD  ibuprofen (ADVIL,MOTRIN) 200 MG tablet Take 800 mg by mouth every 6 (six) hours as needed for moderate pain.    Yes Historical Provider, MD  LINZESS 145 MCG CAPS capsule TAKE 1 CAPSULE BY MOUTH ONCE DAILY. 02/26/15  Yes Rogene Houston, MD  meloxicam (MOBIC) 7.5 MG tablet Take 7.5 mg by mouth daily. 05/17/15 05/16/16 Yes Historical Provider, MD  oxyCODONE (ROXICODONE) 5 MG immediate release tablet Take 2 tablets (10 mg total) by mouth every 4 (four) hours as needed for severe pain. Patient taking differently: Take 5 mg by mouth every 3 (three) hours.  07/08/14  Yes [name], MD  Oxycodone HCl 20 MG TABS Take 1 tablet by mouth every 4 (four) hours. Take every day per  patient   Yes Historical Provider, MD  pantoprazole (PROTONIX) 40 MG tablet Take 40 mg by mouth daily.   Yes Historical Provider, MD  vancomycin 1,000 mg in sodium chloride 0.9 % 250 mL Inject 1,000 mg into the vein every 12 (twelve) hours.   Yes Historical Provider, MD    Physical Exam: Filed Vitals:   06/13/15 0400 06/13/15 0401 06/13/15 0415 06/13/15 0430  BP: 121/63  95/62 119/77  Pulse: 75  80 79  Temp:  97.8 F (36.6 C)    TempSrc:  Oral    Resp: 11  9 11   Height:      Weight:      SpO2: 100%  98% 100%   General: Not in acute distress HEENT:       Eyes: PERRL, EOMI, no scleral icterus.       ENT: No discharge from the ears and nose, no pharynx injection, no tonsillar enlargement.        Neck: No JVD, no bruit, no mass felt. Heme: No neck lymph node enlargement. Cardiac: S1/S2, RRR, No murmurs, No gallops or rubs. Pulm: No rales, wheezing, rhonchi or rubs. Abd: Soft, nondistended, nontender, no rebound pain, no organomegaly, BS  present. Ext: No pitting leg edema bilaterally. 2+DP/PT pulse bilaterally. The is mild redness and tenderness surrounding the PICC line in the R upper arm. Musculoskeletal: No joint deformities, No joint redness or warmth, no limitation of ROM in spin. Has left hip tenderness. Her surgical site healed well. No redness locally. Skin: No rashes.  Neuro: Alert, oriented X3, cranial nerves II-XII grossly intact, muscle strength 5/5 in all extremities, sensation to light touch intact.  Psych: Patient is not psychotic, no suicidal or hemocidal ideation.  Labs on Admission:  Basic Metabolic Panel:  Recent Labs Lab 06/12/15 2230  NA 138  K 3.5  CL 106  CO2 22  GLUCOSE 112*  BUN 17  CREATININE 0.51  CALCIUM 8.4*   Liver Function Tests:  Recent Labs Lab 06/12/15 2230  AST 18  ALT 9*  ALKPHOS 64  BILITOT 0.8  PROT 7.1  ALBUMIN 3.6   No results for input(s): LIPASE, AMYLASE in the last 168 hours. No results for input(s): AMMONIA in the last 168 hours. CBC:  Recent Labs Lab 06/12/15 2230  WBC 9.1  NEUTROABS 7.3  HGB 8.7*  HCT 26.3*  MCV 84.3  PLT 146*   Cardiac Enzymes: No results for input(s): CKTOTAL, CKMB, CKMBINDEX, TROPONINI in the last 168 hours.  BNP (last 3 results) No results for input(s): BNP in the last 8760 hours.  ProBNP (last 3 results) No results for input(s): PROBNP in the last 8760 hours.  CBG: No results for input(s): GLUCAP in the last 168 hours.  Radiological Exams on Admission: Ct Angio Chest Pe W/cm &/or Wo Cm  06/13/2015  CLINICAL DATA:  Shortness of breath and dizziness. Question pulmonary embolus. EXAM: CT ANGIOGRAPHY CHEST WITH CONTRAST TECHNIQUE: Multidetector CT imaging of the chest was performed using the standard protocol during bolus administration of intravenous contrast. Multiplanar CT image reconstructions and MIPs were obtained to evaluate the vascular anatomy. CONTRAST:  25mL OMNIPAQUE IOHEXOL 350 MG/ML SOLN COMPARISON:  Radiographs  1 day prior.  Abdominal CT 07/08/2014 FINDINGS: There are no filling defects within the pulmonary arteries to suggest pulmonary embolus. The heart is normal in size. Thoracic aorta is normal in caliber. Left vertebral artery arises directly from the aortic arch, common origin of the left common carotid and right cephalic arteries. But  normal variants. There is fluid in the superior pericardial recess, no pericardial effusion. No pleural effusion. No mediastinal or hilar adenopathy. Clear lungs.  No consolidation, pulmonary mass or suspicious nodule. No acute abnormality in the included upper abdomen. Pneumobilia, partially included, unchanged. There are no acute or suspicious osseous abnormalities. Review of the MIP images confirms the above findings. IMPRESSION: No pulmonary embolus or acute intrathoracic process. Electronically Signed   By: Jeb Levering M.D.   On: 06/13/2015 02:04   Ct Hip Left W Contrast  06/13/2015  CLINICAL DATA:  Recent left hip surgery due to chronic osteomyelitis. Fever, postoperative infection. EXAM: CT OF THE LEFT HIP WITH CONTRAST TECHNIQUE: Multidetector CT imaging was performed following the standard protocol during bolus administration of intravenous contrast. CONTRAST:  175mL OMNIPAQUE IOHEXOL 300 MG/ML  SOLN COMPARISON:  CT pelvis 10/24/2014 FINDINGS: There is a peripherally enhancing fluid collection draped over the left anterior iliac crest, which may be sub periosteal in location this measures up to 2.2 cm in thickness. This is most prominent medially, with a thin curvilinear lateral component extending to the skin. This is adjacent to chronic sclerosis of the anterior iliac crest with central hypodensity, probable sequestrum and involucrum. There is periosteal reaction more inferiorly. There are multifocal small bony fragments along the inferior margin of the left iliac bone. The previous fluid collection within gluteus medius muscle has resolved. Linear subcutaneous soft  tissue density posterior to gluteus musculature may reflect sequela of prior surgery. IMPRESSION: Peripherally enhancing fluid collection about the left anterior iliac crest, possibly subperiosteal in location. This is concerning for subperiosteal abscess, though in the setting of recent surgery, postoperative seroma is considered. This is adjacent to a region of chronic osteomyelitis. Periosteal reaction is nonspecific and can be in the setting of active infection versus healing from recent surgery. Correlation with detailed surgical history may be helpful. Electronically Signed   By: Jeb Levering M.D.   On: 06/13/2015 00:41   Dg Chest Port 1 View  06/12/2015  CLINICAL DATA:  Fever EXAM: PORTABLE CHEST 1 VIEW COMPARISON:  05/25/2015 FINDINGS: Right upper extremity PICC, tip at the lower SVC. Normal heart size and mediastinal contours. No acute infiltrate or edema. No effusion or pneumothorax. No acute osseous findings. IMPRESSION: Negative portable chest. Electronically Signed   By: Monte Fantasia M.D.   On: 06/12/2015 23:19    EKG: Independently reviewed. QTC 476, T-wave flattening in lateral leads.  Assessment/Plan Principal Problem:   Sepsis ([place]) Active Problems:   LOW BACK PAIN   Chronic constipation   Chronic pain syndrome   Anemia   Fever   SOB (shortness of breath)   Dizziness   Hip osteomyelitis, left ([place])   Sepsis [facility]): Patient is septic on admission with fever, tachycardia and tachypnea. Blood pressure is soft,  but per patient, this is close to her baseline. Etiology for sepsis is likely due to left hip osteomyelitis, but the patient also has signs of possible infection in PICC line in the right upper arm.  -Admitted to stepdown bed -Remove PICC line and send culture of PICC line tip -ED started Vancomycin, aztreonam and Levaquin, we'll switch Levaquin to Flagyl. -will get Procalcitonin and trend lactic acid levels per sepsis protocol. -IVF: 3L of NS bolus in ED,  followed by 125 cc/h   Hip osteomyelitis, left ([place]): She has worsening left hip pain, indicating possible worsening infection. -Antibiotics as above -CT scan of the left hip was ordered by ED, with follow-up -Pain control: When  necessary Dilaudid -Continue home oxycodone -When necessary Zofran for nausea, Benadryl for itchy  Chronic constipation: -Continue home medications  Chronic pain syndrome: -Continue home oxycodone, Mobic  Anemia: Hemoglobin dropped from 10.5 on 05/05/15--> 8.7. Patient denies dark stool -Check FOBT -Anemia panel -Continue iron supplement  Shortness of breath: Patient has shortness of breath, but no cough or chest pain. Etiology is not clear. Chest x-ray is negative. Due to decreased mobility, she is at risk of developing pulmonary embolism. -CT angiogram to rule out PE -When necessary albuterol nebulizer  Tobacco abuse: -Did counseling about importance of quitting smoking -Nicotine patch   DVT ppx: SQ Heparin  Code Status: Full code Family Communication:   Yes, patient's mother and husband at bed side sposition Plan: Admit to inpatient   Date of Service 06/13/2015    Ivor Costa [facility] Pager [phone]  If 7PM-7AM, please contact night-coverage [email or web address] Password [email or web address] 06/13/2015, 5:15 AM

## 2015-06-13 ENCOUNTER — Inpatient Hospital Stay (HOSPITAL_COMMUNITY): Payer: Medicaid Other

## 2015-06-13 ENCOUNTER — Encounter (HOSPITAL_COMMUNITY): Payer: Self-pay | Admitting: *Deleted

## 2015-06-13 DIAGNOSIS — M869 Osteomyelitis, unspecified: Secondary | ICD-10-CM | POA: Diagnosis present

## 2015-06-13 DIAGNOSIS — Z79891 Long term (current) use of opiate analgesic: Secondary | ICD-10-CM | POA: Diagnosis not present

## 2015-06-13 DIAGNOSIS — K3533 Acute appendicitis with perforation and localized peritonitis, with abscess: Secondary | ICD-10-CM

## 2015-06-13 DIAGNOSIS — D509 Iron deficiency anemia, unspecified: Secondary | ICD-10-CM | POA: Diagnosis present

## 2015-06-13 DIAGNOSIS — G894 Chronic pain syndrome: Secondary | ICD-10-CM | POA: Diagnosis present

## 2015-06-13 DIAGNOSIS — M86659 Other chronic osteomyelitis, unspecified thigh: Secondary | ICD-10-CM | POA: Insufficient documentation

## 2015-06-13 DIAGNOSIS — F17210 Nicotine dependence, cigarettes, uncomplicated: Secondary | ICD-10-CM | POA: Diagnosis present

## 2015-06-13 DIAGNOSIS — A419 Sepsis, unspecified organism: Principal | ICD-10-CM

## 2015-06-13 DIAGNOSIS — R0602 Shortness of breath: Secondary | ICD-10-CM

## 2015-06-13 DIAGNOSIS — Z880 Allergy status to penicillin: Secondary | ICD-10-CM | POA: Diagnosis not present

## 2015-06-13 DIAGNOSIS — I959 Hypotension, unspecified: Secondary | ICD-10-CM | POA: Diagnosis present

## 2015-06-13 DIAGNOSIS — K5900 Constipation, unspecified: Secondary | ICD-10-CM

## 2015-06-13 DIAGNOSIS — M868X9 Other osteomyelitis, unspecified sites: Secondary | ICD-10-CM | POA: Diagnosis present

## 2015-06-13 DIAGNOSIS — I9589 Other hypotension: Secondary | ICD-10-CM | POA: Diagnosis not present

## 2015-06-13 DIAGNOSIS — R509 Fever, unspecified: Secondary | ICD-10-CM | POA: Diagnosis present

## 2015-06-13 DIAGNOSIS — Z91013 Allergy to seafood: Secondary | ICD-10-CM | POA: Diagnosis not present

## 2015-06-13 DIAGNOSIS — L02214 Cutaneous abscess of groin: Secondary | ICD-10-CM | POA: Diagnosis present

## 2015-06-13 DIAGNOSIS — Z8249 Family history of ischemic heart disease and other diseases of the circulatory system: Secondary | ICD-10-CM | POA: Diagnosis not present

## 2015-06-13 DIAGNOSIS — Z881 Allergy status to other antibiotic agents status: Secondary | ICD-10-CM | POA: Diagnosis not present

## 2015-06-13 DIAGNOSIS — K219 Gastro-esophageal reflux disease without esophagitis: Secondary | ICD-10-CM | POA: Diagnosis present

## 2015-06-13 DIAGNOSIS — E876 Hypokalemia: Secondary | ICD-10-CM | POA: Diagnosis present

## 2015-06-13 DIAGNOSIS — Z91030 Bee allergy status: Secondary | ICD-10-CM | POA: Diagnosis not present

## 2015-06-13 DIAGNOSIS — R42 Dizziness and giddiness: Secondary | ICD-10-CM

## 2015-06-13 LAB — CBC
HEMATOCRIT: 26.1 % — AB (ref 36.0–46.0)
Hemoglobin: 8.4 g/dL — ABNORMAL LOW (ref 12.0–15.0)
MCH: 27.8 pg (ref 26.0–34.0)
MCHC: 32.2 g/dL (ref 30.0–36.0)
MCV: 86.4 fL (ref 78.0–100.0)
PLATELETS: 186 10*3/uL (ref 150–400)
RBC: 3.02 MIL/uL — ABNORMAL LOW (ref 3.87–5.11)
RDW: 13.8 % (ref 11.5–15.5)
WBC: 9.2 10*3/uL (ref 4.0–10.5)

## 2015-06-13 LAB — BASIC METABOLIC PANEL
Anion gap: 5 (ref 5–15)
BUN: 11 mg/dL (ref 6–20)
CALCIUM: 7.8 mg/dL — AB (ref 8.9–10.3)
CO2: 24 mmol/L (ref 22–32)
CREATININE: 0.63 mg/dL (ref 0.44–1.00)
Chloride: 110 mmol/L (ref 101–111)
GFR calc Af Amer: >60 mL/min (ref >60)
GLUCOSE: 96 mg/dL (ref 65–99)
POTASSIUM: 3.4 mmol/L — AB (ref 3.5–5.1)
SODIUM: 139 mmol/L (ref 135–145)

## 2015-06-13 LAB — PROTIME-INR
INR: 1.34 (ref 0.00–1.49)
PROTHROMBIN TIME: 16.7 s — AB (ref 11.6–15.2)

## 2015-06-13 LAB — LACTIC ACID, PLASMA
LACTIC ACID, VENOUS: 0.6 mmol/L (ref 0.5–2.0)
LACTIC ACID, VENOUS: 0.6 mmol/L (ref 0.5–2.0)

## 2015-06-13 LAB — HCG, QUANTITATIVE, PREGNANCY: hCG, Beta Chain, Quant, S: <1 m[IU]/mL (ref <5)

## 2015-06-13 LAB — GLUCOSE, CAPILLARY: GLUCOSE-CAPILLARY: 89 mg/dL (ref 65–99)

## 2015-06-13 LAB — APTT: APTT: 34 s (ref 24–37)

## 2015-06-13 LAB — MRSA PCR SCREENING: MRSA by PCR: NEGATIVE

## 2015-06-13 LAB — PROCALCITONIN: PROCALCITONIN: 0.15 ng/mL

## 2015-06-13 MED ORDER — ALBUTEROL SULFATE (2.5 MG/3ML) 0.083% IN NEBU: 2.5000 mg | INHALATION_SOLUTION | RESPIRATORY_TRACT | Status: DC | PRN

## 2015-06-13 MED ORDER — EPINEPHRINE 0.3 MG/0.3ML IJ SOAJ
0.3000 mg | Freq: Once | INTRAMUSCULAR | Status: DC | PRN
  Filled 2015-06-13: qty 0.3

## 2015-06-13 MED ORDER — AZTREONAM 2 G IJ SOLR
2.0000 g | Freq: Three times a day (TID) | INTRAMUSCULAR | Status: DC
  Administered 2015-06-13 (×2): 2 g via INTRAVENOUS
  Filled 2015-06-13 (×5): qty 2

## 2015-06-13 MED ORDER — POTASSIUM CHLORIDE CRYS ER 20 MEQ PO TBCR
30.0000 meq | EXTENDED_RELEASE_TABLET | Freq: Two times a day (BID) | ORAL | Status: DC
  Administered 2015-06-13: 30 meq via ORAL
  Filled 2015-06-13 (×2): qty 1

## 2015-06-13 MED ORDER — ACETAMINOPHEN 650 MG RE SUPP: 650.0000 mg | Freq: Four times a day (QID) | RECTAL | Status: DC | PRN

## 2015-06-13 MED ORDER — LINACLOTIDE 145 MCG PO CAPS
145.0000 ug | ORAL_CAPSULE | Freq: Every day | ORAL | Status: DC
  Administered 2015-06-13: 145 ug via ORAL
  Filled 2015-06-13 (×3): qty 1

## 2015-06-13 MED ORDER — IOHEXOL 350 MG/ML SOLN
80.0000 mL | Freq: Once | INTRAVENOUS | Status: AC | PRN
  Administered 2015-06-13: 80 mL via INTRAVENOUS

## 2015-06-13 MED ORDER — DIPHENHYDRAMINE HCL 25 MG PO CAPS
25.0000 mg | ORAL_CAPSULE | Freq: Three times a day (TID) | ORAL | Status: DC
  Administered 2015-06-13 (×2): 25 mg via ORAL
  Filled 2015-06-13 (×2): qty 1

## 2015-06-13 MED ORDER — PROMETHAZINE HCL 12.5 MG PO TABS: 12.5000 mg | ORAL_TABLET | Freq: Four times a day (QID) | ORAL | Status: DC | PRN

## 2015-06-13 MED ORDER — SODIUM CHLORIDE 0.9 % IJ SOLN
3.0000 mL | Freq: Two times a day (BID) | INTRAMUSCULAR | Status: DC
  Administered 2015-06-13 (×2): 3 mL via INTRAVENOUS

## 2015-06-13 MED ORDER — HYDROMORPHONE HCL 1 MG/ML IJ SOLN
1.0000 mg | INTRAMUSCULAR | Status: DC | PRN
  Administered 2015-06-13 (×2): 1 mg via INTRAVENOUS
  Filled 2015-06-13 (×2): qty 1

## 2015-06-13 MED ORDER — MORPHINE SULFATE (PF) 2 MG/ML IV SOLN
2.0000 mg | INTRAVENOUS | Status: DC | PRN
  Administered 2015-06-13: 2 mg via INTRAVENOUS
  Filled 2015-06-13: qty 1

## 2015-06-13 MED ORDER — DIPHENHYDRAMINE HCL 25 MG PO CAPS: 25.0000 mg | ORAL_CAPSULE | ORAL | Status: DC | PRN

## 2015-06-13 MED ORDER — IBUPROFEN 200 MG PO TABS: 600.0000 mg | ORAL_TABLET | Freq: Three times a day (TID) | ORAL | Status: AC | PRN

## 2015-06-13 MED ORDER — ACETAMINOPHEN 325 MG PO TABS: 650.0000 mg | ORAL_TABLET | Freq: Four times a day (QID) | ORAL | Status: AC | PRN

## 2015-06-13 MED ORDER — DIPHENHYDRAMINE HCL 25 MG PO CAPS: 25.0000 mg | ORAL_CAPSULE | Freq: Three times a day (TID) | ORAL | Status: DC

## 2015-06-13 MED ORDER — HYDROMORPHONE HCL 1 MG/ML IJ SOLN
0.5000 mg | INTRAMUSCULAR | Status: DC | PRN
  Administered 2015-06-13: 1 mg via INTRAVENOUS
  Filled 2015-06-13: qty 1

## 2015-06-13 MED ORDER — MELOXICAM 7.5 MG PO TABS
7.5000 mg | ORAL_TABLET | Freq: Every day | ORAL | Status: DC
  Administered 2015-06-13: 7.5 mg via ORAL
  Filled 2015-06-13 (×3): qty 1

## 2015-06-13 MED ORDER — PANTOPRAZOLE SODIUM 40 MG PO TBEC
40.0000 mg | DELAYED_RELEASE_TABLET | Freq: Every day | ORAL | Status: DC
  Administered 2015-06-13: 40 mg via ORAL
  Filled 2015-06-13: qty 1

## 2015-06-13 MED ORDER — HYDROMORPHONE HCL 1 MG/ML IJ SOLN
0.5000 mg | Freq: Four times a day (QID) | INTRAMUSCULAR | Status: DC | PRN
Start: 2015-06-13 — End: 2015-06-13
  Administered 2015-06-13: 0.5 mg via INTRAVENOUS
  Filled 2015-06-13: qty 1

## 2015-06-13 MED ORDER — OXYCODONE HCL 5 MG PO TABS
20.0000 mg | ORAL_TABLET | ORAL | Status: DC
  Administered 2015-06-13 (×5): 20 mg via ORAL
  Filled 2015-06-13 (×5): qty 4

## 2015-06-13 MED ORDER — HYDROMORPHONE HCL 1 MG/ML IJ SOLN: 0.5000 mg | Freq: Four times a day (QID) | INTRAMUSCULAR | Status: DC | PRN

## 2015-06-13 MED ORDER — FERROUS SULFATE 325 (65 FE) MG PO TABS
325.0000 mg | ORAL_TABLET | Freq: Every day | ORAL | Status: DC
  Administered 2015-06-13: 325 mg via ORAL
  Filled 2015-06-13: qty 1

## 2015-06-13 MED ORDER — DIPHENHYDRAMINE HCL 25 MG PO CAPS
25.0000 mg | ORAL_CAPSULE | ORAL | Status: DC | PRN
  Administered 2015-06-13: 25 mg via ORAL
  Filled 2015-06-13: qty 1

## 2015-06-13 MED ORDER — POTASSIUM CHLORIDE CRYS ER 15 MEQ PO TBCR: 30.0000 meq | EXTENDED_RELEASE_TABLET | Freq: Two times a day (BID) | ORAL | Status: DC

## 2015-06-13 MED ORDER — SODIUM CHLORIDE 0.9 % IV SOLN: 1000.0000 mL | INTRAVENOUS | Status: DC

## 2015-06-13 MED ORDER — VANCOMYCIN HCL 1000 MG IV SOLR
1000.0000 mg | Freq: Two times a day (BID) | INTRAVENOUS | Status: DC
  Filled 2015-06-13: qty 1000

## 2015-06-13 MED ORDER — DEXTROSE 5 % IV SOLN: 2.0000 g | Freq: Three times a day (TID) | INTRAVENOUS | Status: DC

## 2015-06-13 MED ORDER — HYDROMORPHONE HCL 1 MG/ML IJ SOLN
0.5000 mg | INTRAMUSCULAR | Status: DC | PRN
Start: 2015-06-13 — End: 2015-06-13

## 2015-06-13 MED ORDER — ALUM & MAG HYDROXIDE-SIMETH 200-200-20 MG/5ML PO SUSP: 30.0000 mL | Freq: Four times a day (QID) | ORAL | Status: DC | PRN

## 2015-06-13 MED ORDER — HEPARIN SODIUM (PORCINE) 5000 UNIT/ML IJ SOLN: 5000.0000 [IU] | Freq: Three times a day (TID) | INTRAMUSCULAR | Status: DC

## 2015-06-13 MED ORDER — SODIUM CHLORIDE 0.9 % IV BOLUS (SEPSIS)
500.0000 mL | Freq: Once | INTRAVENOUS | Status: AC
  Administered 2015-06-13: 500 mL via INTRAVENOUS

## 2015-06-13 MED ORDER — METRONIDAZOLE IN NACL 5-0.79 MG/ML-% IV SOLN: 500.0000 mg | Freq: Three times a day (TID) | INTRAVENOUS | Status: DC

## 2015-06-13 MED ORDER — SODIUM CHLORIDE 0.9 % IV SOLN
INTRAVENOUS | Status: AC
  Administered 2015-06-13: 02:00:00 via INTRAVENOUS

## 2015-06-13 MED ORDER — METRONIDAZOLE IN NACL 5-0.79 MG/ML-% IV SOLN
500.0000 mg | Freq: Three times a day (TID) | INTRAVENOUS | Status: DC
  Administered 2015-06-13 (×2): 500 mg via INTRAVENOUS
  Filled 2015-06-13 (×2): qty 100

## 2015-06-13 MED ORDER — VANCOMYCIN HCL IN DEXTROSE 750-5 MG/150ML-% IV SOLN: 750.0000 mg | Freq: Three times a day (TID) | INTRAVENOUS | Status: DC

## 2015-06-13 MED ORDER — NICOTINE 21 MG/24HR TD PT24
21.0000 mg | MEDICATED_PATCH | Freq: Every day | TRANSDERMAL | Status: DC
  Administered 2015-06-13: 21 mg via TRANSDERMAL
  Filled 2015-06-13: qty 1

## 2015-06-13 MED ORDER — ACETAMINOPHEN 325 MG PO TABS
650.0000 mg | ORAL_TABLET | Freq: Four times a day (QID) | ORAL | Status: DC | PRN
  Administered 2015-06-13: 650 mg via ORAL
  Filled 2015-06-13: qty 2

## 2015-06-13 MED ORDER — VANCOMYCIN HCL IN DEXTROSE 750-5 MG/150ML-% IV SOLN
750.0000 mg | Freq: Three times a day (TID) | INTRAVENOUS | Status: DC
  Administered 2015-06-13 (×2): 750 mg via INTRAVENOUS
  Filled 2015-06-13 (×4): qty 150

## 2015-06-13 MED ORDER — HEPARIN SODIUM (PORCINE) 5000 UNIT/ML IJ SOLN
5000.0000 [IU] | Freq: Three times a day (TID) | INTRAMUSCULAR | Status: DC
  Administered 2015-06-13 (×2): 5000 [IU] via SUBCUTANEOUS
  Filled 2015-06-13 (×2): qty 1

## 2015-06-13 MED ORDER — DIPHENHYDRAMINE HCL 50 MG/ML IJ SOLN
25.0000 mg | Freq: Once | INTRAMUSCULAR | Status: AC
  Administered 2015-06-13: 25 mg via INTRAVENOUS
  Filled 2015-06-13: qty 1

## 2015-06-13 MED ORDER — SODIUM CHLORIDE 0.9 % IV BOLUS (SEPSIS)
1000.0000 mL | INTRAVENOUS | Status: DC
  Administered 2015-06-13: 1000 mL via INTRAVENOUS

## 2015-06-13 NOTE — Progress Notes (Signed)
ANTIBIOTIC CONSULT NOTE - INITIAL  Pharmacy Consult for Vancomycin / Aztreonam Indication: rule out sepsis  Allergies  Allergen Reactions  . Amoxicillin Anaphylaxis, Hives and Rash    Able to take Ancef as prophylaxis without reaction  . Bee Venom Anaphylaxis  . Shellfish Allergy Anaphylaxis  . Vancomycin Hcl [Vancomycin] Itching    Can take it if given Benadryl 30 minutes prior to Vancomycin  . Ketamine Other (See Comments)    Caused confusion and patient felt "high, like my face was going to melt off" Caused confusion and patient felt "high, like my face was going to melt off"  . Gabapentin Palpitations  . Iodine Rash    Topical form only  . Other Rash    GRASS  . Penicillins Hives and Rash    Has patient had a PCN reaction causing immediate rash, facial/tongue/throat swelling, SOB or lightheadedness with hypotension: YES  Has patient had a PCN reaction causing severe rash involving mucus membranes or skin necrosis: NO Has patient had a PCN reaction that required hospitalization NO Has patient had a PCN reaction occurring within the last 10 years: NO If all of the above answers are "NO", then may proceed with Cephalosporin use.     Patient Measurements: Height: 5\' 5"  (165.1 cm) Weight: 141 lb 5 oz (64.1 kg) IBW/kg (Calculated) : 57  Vital Signs: Temp: 97.8 F (36.6 C) (11/09 0401) Temp Source: Oral (11/09 0401) BP: 82/40 mmHg (11/09 0600) Pulse Rate: 66 (11/09 0600) Intake/Output from previous day: 11/08 0701 - 11/09 0700 In: 5139.3 [P.O.:120; I.V.:2519.3; IV Piggyback:2500] Out: -  Intake/Output from this shift: Total I/O In: 5139.3 [P.O.:120; I.V.:2519.3; IV Piggyback:2500] Out: -   Labs:  Recent Labs  06/12/15 2230 06/13/15 0441  WBC 9.1 9.2  HGB 8.7* 8.4*  PLT 146* 186  CREATININE 0.51 0.63   Estimated Creatinine Clearance: 93.4 mL/min (by C-G formula based on Cr of 0.63). No results for input(s): VANCOTROUGH, VANCOPEAK, VANCORANDOM, GENTTROUGH,  GENTPEAK, GENTRANDOM, TOBRATROUGH, TOBRAPEAK, TOBRARND, AMIKACINPEAK, AMIKACINTROU, AMIKACIN in the last 72 hours.   Microbiology: Recent Results (from the past 720 hour(s))  MRSA PCR Screening     Status: None   Collection Time: 06/13/15  2:08 AM  Result Value Ref Range Status   MRSA by PCR NEGATIVE NEGATIVE Final    Comment:        The GeneXpert MRSA Assay (FDA approved for NASAL specimens only), is one component of a comprehensive MRSA colonization surveillance program. It is not intended to diagnose MRSA infection nor to guide or monitor treatment for MRSA infections.     Medical History: Past Medical History  Diagnosis Date  . Arrhythmia   . Chronic pain syndrome   . IBS (irritable bowel syndrome)   . Chronic constipation   . Chronic pelvic pain in female   . Osteomyelitis ([place])     bilateral hips   Medications:  Prescriptions prior to admission  Medication Sig Dispense Refill Last Dose  . EPINEPHrine (EPIPEN) 0.3 mg/0.3 mL DEVI Inject 0.3 mg into the muscle once.   unknown  . ferrous sulfate 325 (65 FE) MG EC tablet Take 325 mg by mouth daily with breakfast.   06/12/2015 at Unknown time  . ibuprofen (ADVIL,MOTRIN) 200 MG tablet Take 800 mg by mouth every 6 (six) hours as needed for moderate pain.    06/12/2015 at Unknown time  . LINZESS 145 MCG CAPS capsule TAKE 1 CAPSULE BY MOUTH ONCE DAILY. 30 capsule 5 06/12/2015 at Unknown time  .  meloxicam (MOBIC) 7.5 MG tablet Take 7.5 mg by mouth daily.   06/12/2015 at Unknown time  . oxyCODONE (ROXICODONE) 5 MG immediate release tablet Take 2 tablets (10 mg total) by mouth every 4 (four) hours as needed for severe pain. (Patient taking differently: Take 5 mg by mouth every 3 (three) hours. ) 30 tablet 0 06/12/2015 at Unknown time  . Oxycodone HCl 20 MG TABS Take 1 tablet by mouth every 4 (four) hours. Take every day per patient   06/12/2015 at Unknown time  . pantoprazole (PROTONIX) 40 MG tablet Take 40 mg by mouth daily.   06/12/2015  at Unknown time  . vancomycin 1,000 mg in sodium chloride 0.9 % 250 mL Inject 1,000 mg into the vein every 12 (twelve) hours.   06/12/2015 at 1000   Assessment: Okay for Protocol, received initial doses in ED.  Premedicate w/ Benadryl prior to Vancomycin due to Hx itching.  Home Vancomycin started 05/14/15 (suposed to take for 6 weeks) by [facility] ID services for bilateral iliac wing osteomyelitis, but several missed doses lately.  Also on IV Flagyl.  Goal of Therapy:  Vancomycin trough level 15-20 mcg/ml  Plan:  Aztreonam 2gm IV every 8 hours. Vancomycin 750mg  IV every 8 hours. Measure antibiotic drug levels at steady state Follow up culture results  Pricilla Larsson 06/13/2015,6:55 AM

## 2015-06-13 NOTE — Progress Notes (Signed)
Patient prefers to see Hospitalist while inpatient.  She states she has not seen Dr. Legrand Rams in over a year.

## 2015-06-13 NOTE — Progress Notes (Signed)
Patient transporting to [place] aware.  Report called to [place], RN on receiving unit.  Patient going to room 7115.  Transport packet prepared.

## 2015-06-13 NOTE — Progress Notes (Signed)
Patient presented with hypotension and requesting multiple pain medications. Multiple accommodations were made to help decrease the patient's pain. Several different PRN medications were administered after several calls to the night time hospitalitis. These were not what the patient had requested as she and her mother made all the staff very aware of. She has become agitated, cussing loudly, and saying that she is just leaving. All orders were explained to the patient and her family that remain at the bedside; nothing seems to be making her happy at this point; home medications were restarted per order with minimal relief; blood pressure has improved after 1000 ml bolus of NS given; patient is still complaining of pain; she has been given heat packs and offered ice packs for her hip if those options would help as well; she is upset about having to call to get out of the bed with all the safety items being explained from the height of the bed, the cardiac monitor cables,and the possibility of a fall due to multiple medications she has received; patient verbalized wanting to be transferred to [place], process explained that a physician at [facility] would have to accept her as a patient and a bed would have to be available; after multiple attempts to satisfy the patient's needs she has slightly improved  her outlook; after all of her cussing and fit throwing she is not apologizing for her actions; will continue to monitor and document any changes in the patients condition.

## 2015-06-13 NOTE — Plan of Care (Signed)
Problem: Fluid Volume: Goal: Hemodynamic stability will improve Outcome: Progressing Patient received 3rd 1L NS bolus

## 2015-06-13 NOTE — Care Management Note (Signed)
Case Management Note  Patient Details  Name: Catherine Ramirez MRN: [id number] Date of Birth: June 17, 1986  Subjective/Objective:                  Pt is from home and ind with ADL's. Pt admitted with osteo of hip. Pt was receiving IV abx at home prior to admission. Pt received IV abx from [facility]. She was not receiving any HH services for abx administration. Pt reports having approx 2-4 weeks left of IV abx.   Action/Plan: Anticipate return home with continuation of IV abx. Will cont to follow for DC planning.   Expected Discharge Date:  06/15/15               Expected Discharge Plan:  Home/Self Care  In-House Referral:  NA  Discharge planning Services  CM Consult  Post Acute Care Choice:  NA Choice offered to:  NA  DME Arranged:    DME Agency:     HH Arranged:    HH Agency:     Status of Service:  In process, will continue to follow  Medicare Important Message Given:    Date Medicare IM Given:    Medicare IM give by:    Date Additional Medicare IM Given:    Additional Medicare Important Message give by:     If discussed at [place] of Stay Meetings, dates discussed:    Additional Comments:  Sherald Barge, RN 06/13/2015, 2:56 PM
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
## 2015-06-13 NOTE — Progress Notes (Signed)
Patient removed from ICU monitor and care turned over to [facility] for transport to [facility]; Patient placed on [facility] stretcher, connected to cardiac monitor, all belongings packed; family at the bedside; patient given scheduled pain medication of the ride; all paperwork and permission to transport signed; patient en route to receiving facility
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
## 2015-06-14 LAB — URINE CULTURE: CULTURE: NO GROWTH

## 2015-06-17 LAB — CULTURE, BLOOD (ROUTINE X 2): Culture: NO GROWTH

## 2015-06-19 LAB — CULTURE, BLOOD (ROUTINE X 2)

## 2015-07-30 ENCOUNTER — Encounter (HOSPITAL_COMMUNITY): Payer: Self-pay | Admitting: *Deleted

## 2015-07-30 DIAGNOSIS — F17210 Nicotine dependence, cigarettes, uncomplicated: Secondary | ICD-10-CM | POA: Insufficient documentation

## 2015-07-30 DIAGNOSIS — Z8739 Personal history of other diseases of the musculoskeletal system and connective tissue: Secondary | ICD-10-CM | POA: Diagnosis not present

## 2015-07-30 DIAGNOSIS — M25552 Pain in left hip: Secondary | ICD-10-CM | POA: Diagnosis not present

## 2015-07-30 DIAGNOSIS — Z8719 Personal history of other diseases of the digestive system: Secondary | ICD-10-CM | POA: Diagnosis not present

## 2015-07-30 DIAGNOSIS — M25452 Effusion, left hip: Secondary | ICD-10-CM | POA: Diagnosis not present

## 2015-07-30 DIAGNOSIS — Z791 Long term (current) use of non-steroidal anti-inflammatories (NSAID): Secondary | ICD-10-CM | POA: Diagnosis not present

## 2015-07-30 DIAGNOSIS — Z87828 Personal history of other (healed) physical injury and trauma: Secondary | ICD-10-CM | POA: Insufficient documentation

## 2015-07-30 DIAGNOSIS — Z79899 Other long term (current) drug therapy: Secondary | ICD-10-CM | POA: Insufficient documentation

## 2015-07-30 DIAGNOSIS — Z3202 Encounter for pregnancy test, result negative: Secondary | ICD-10-CM | POA: Insufficient documentation

## 2015-07-30 DIAGNOSIS — G8929 Other chronic pain: Secondary | ICD-10-CM | POA: Diagnosis not present

## 2015-07-30 DIAGNOSIS — Z880 Allergy status to penicillin: Secondary | ICD-10-CM | POA: Insufficient documentation

## 2015-07-30 DIAGNOSIS — Z8679 Personal history of other diseases of the circulatory system: Secondary | ICD-10-CM | POA: Diagnosis not present

## 2015-07-30 NOTE — ED Notes (Addendum)
Pt c/o left hip pain. Pt states she has chronic osteomylitis in her pelvis.  Pt states the left hip pain and swelling began on Saturday. Pt has taken tylenol and ibuprofen because she felt like she was getting a fever.

## 2015-07-31 ENCOUNTER — Emergency Department (HOSPITAL_COMMUNITY): Payer: Medicaid Other

## 2015-07-31 ENCOUNTER — Emergency Department (HOSPITAL_COMMUNITY)
Admission: EM | Admit: 2015-07-31 | Discharge: 2015-07-31 | Disposition: A | Discharge status: Home / Self Care | Payer: Medicaid Other | Attending: Emergency Medicine | Admitting: Emergency Medicine

## 2015-07-31 DIAGNOSIS — M25552 Pain in left hip: Secondary | ICD-10-CM

## 2015-07-31 LAB — CBC WITH DIFFERENTIAL/PLATELET
BASOS ABS: 0 10*3/uL (ref 0.0–0.1)
Basophils Relative: 0 %
EOS PCT: 2 %
Eosinophils Absolute: 0.2 10*3/uL (ref 0.0–0.7)
HEMATOCRIT: 31.8 % — AB (ref 36.0–46.0)
HEMOGLOBIN: 10.4 g/dL — AB (ref 12.0–15.0)
LYMPHS ABS: 3.7 10*3/uL (ref 0.7–4.0)
LYMPHS PCT: 44 %
MCH: 27.7 pg (ref 26.0–34.0)
MCHC: 32.7 g/dL (ref 30.0–36.0)
MCV: 84.6 fL (ref 78.0–100.0)
Monocytes Absolute: 0.3 10*3/uL (ref 0.1–1.0)
Monocytes Relative: 4 %
NEUTROS ABS: 4.2 10*3/uL (ref 1.7–7.7)
NEUTROS PCT: 50 %
Platelets: 219 10*3/uL (ref 150–400)
RBC: 3.76 MIL/uL — AB (ref 3.87–5.11)
RDW: 17 % — ABNORMAL HIGH (ref 11.5–15.5)
WBC: 8.5 10*3/uL (ref 4.0–10.5)

## 2015-07-31 LAB — URINALYSIS, ROUTINE W REFLEX MICROSCOPIC
BILIRUBIN URINE: NEGATIVE
GLUCOSE, UA: NEGATIVE mg/dL
KETONES UR: NEGATIVE mg/dL
Leukocytes, UA: NEGATIVE
NITRITE: NEGATIVE
PH: 6 (ref 5.0–8.0)
Protein, ur: NEGATIVE mg/dL

## 2015-07-31 LAB — URINE MICROSCOPIC-ADD ON: WBC UA: NONE SEEN WBC/hpf (ref 0–5)

## 2015-07-31 LAB — BASIC METABOLIC PANEL
ANION GAP: 8 (ref 5–15)
BUN: 24 mg/dL — AB (ref 6–20)
CHLORIDE: 105 mmol/L (ref 101–111)
CO2: 26 mmol/L (ref 22–32)
Calcium: 9.1 mg/dL (ref 8.9–10.3)
Creatinine, Ser: 0.69 mg/dL (ref 0.44–1.00)
GFR calc Af Amer: >60 mL/min (ref >60)
GFR calc non Af Amer: >60 mL/min (ref >60)
GLUCOSE: 89 mg/dL (ref 65–99)
POTASSIUM: 4 mmol/L (ref 3.5–5.1)
Sodium: 139 mmol/L (ref 135–145)

## 2015-07-31 LAB — LACTIC ACID, PLASMA: Lactic Acid, Venous: 0.6 mmol/L (ref 0.5–2.0)

## 2015-07-31 LAB — POC URINE PREG, ED: Preg Test, Ur: NEGATIVE

## 2015-07-31 MED ORDER — IOHEXOL 300 MG/ML  SOLN
100.0000 mL | Freq: Once | INTRAMUSCULAR | Status: AC | PRN
  Administered 2015-07-31: 100 mL via INTRAVENOUS

## 2015-07-31 MED ORDER — SODIUM CHLORIDE 0.9 % IV BOLUS (SEPSIS)
1000.0000 mL | Freq: Once | INTRAVENOUS | Status: AC
  Administered 2015-07-31: 1000 mL via INTRAVENOUS

## 2015-07-31 MED ORDER — HYDROMORPHONE HCL 1 MG/ML IJ SOLN
0.5000 mg | Freq: Once | INTRAMUSCULAR | Status: AC
  Administered 2015-07-31: 0.5 mg via INTRAVENOUS
  Filled 2015-07-31: qty 1

## 2015-07-31 MED ORDER — ONDANSETRON HCL 4 MG/2ML IJ SOLN
4.0000 mg | Freq: Once | INTRAMUSCULAR | Status: AC
  Administered 2015-07-31: 4 mg via INTRAVENOUS
  Filled 2015-07-31: qty 2

## 2015-07-31 NOTE — Discharge Instructions (Signed)
Please continue your pain medication as prescribed. Please follow-up with Dr. Jeannine Kitten. I recommend you call in the morning to see if you can move your appointment up earlier this week. If you develop worsening pain is uncontrolled at home, fever, vomiting, difficulty moving her leg, redness or warmth on the outside of your skin, please return to the hospital. Your labs today were normal. Your CT scan showed a small amount of fluid that was thought to be postsurgical in nature. There was no obvious abscess or sign of osteomyelitis on your CT today but if your symptoms worsen you do need to be seen again in the emergency department.   Hip Pain Your hip is the joint between your upper legs and your lower pelvis. The bones, cartilage, tendons, and muscles of your hip joint perform a lot of work each day supporting your body weight and allowing you to move around. Hip pain can range from a minor ache to severe pain in one or both of your hips. Pain may be felt on the inside of the hip joint near the groin, or the outside near the buttocks and upper thigh. You may have swelling or stiffness as well.  HOME CARE INSTRUCTIONS   Take medicines only as directed by your health care provider.  Apply ice to the injured area:  Put ice in a plastic bag.  Place a towel between your skin and the bag.  Leave the ice on for 15-20 minutes at a time, 3-4 times a day.  Keep your leg raised (elevated) when possible to lessen swelling.  Avoid activities that cause pain.  Follow specific exercises as directed by your health care provider.  Sleep with a pillow between your legs on your most comfortable side.  Record how often you have hip pain, the location of the pain, and what it feels like. SEEK MEDICAL CARE IF:   You are unable to put weight on your leg.  Your hip is red or swollen or very tender to touch.  Your pain or swelling continues or worsens after 1 week.  You have increasing difficulty  walking.  You have a fever. SEEK IMMEDIATE MEDICAL CARE IF:   You have fallen.  You have a sudden increase in pain and swelling in your hip. MAKE SURE YOU:   Understand these instructions.  Will watch your condition.  Will get help right away if you are not doing well or get worse.   This information is not intended to replace advice given to you by your health care provider. Make sure you discuss any questions you have with your health care provider.   Document Released: 01/08/2010 Document Revised: 08/11/2014 Document Reviewed: 03/17/2013 Elsevier Interactive Patient Education 2016 [place] for Routine Care of Injuries Theroutine careofmanyinjuriesincludes rest, ice, compression, and elevation (RICE therapy). RICE therapy is often recommended for injuries to soft tissues, such as a muscle strain, ligament injuries, bruises, and overuse injuries. It can also be used for some bony injuries. Using RICE therapy can help to relieve pain, lessen swelling, and enable your body to heal. Rest Rest is required to allow your body to heal. This usually involves reducing your normal activities and avoiding use of the injured part of your body. Generally, you can return to your normal activities when you are comfortable and have been given permission by your health care provider. Ice Icing your injury helps to keep the swelling down, and it lessens pain. Do not apply ice directly to  your skin.  Put ice in a plastic bag.  Place a towel between your skin and the bag.  Leave the ice on for 20 minutes, 2-3 times a day. Do this for as long as you are directed by your health care provider. Compression Compression means putting pressure on the injured area. Compression helps to keep swelling down, gives support, and helps with discomfort. Compression may be done with an elastic bandage. If an elastic bandage has been applied, follow these general tips:  Remove and reapply the  bandage every 3-4 hours or as directed by your health care provider.  Make sure the bandage is not wrapped too tightly, because this can cut off circulation. If part of your body beyond the bandage becomes blue, numb, cold, swollen, or more painful, your bandage is most likely too tight. If this occurs, remove your bandage and reapply it more loosely.  See your health care provider if the bandage seems to be making your problems worse rather than better. Elevation Elevation means keeping the injured area raised. This helps to lessen swelling and decrease pain. If possible, your injured area should be elevated at or above the level of your heart or the center of your chest. [place]? You should seek medical care if:  Your pain and swelling continue.  Your symptoms are getting worse rather than improving. These symptoms may indicate that further evaluation or further X-rays are needed. Sometimes, X-rays may not show a small broken bone (fracture) until a number of days later. Make a follow-up appointment with your health care provider. WHEN SHOULD I SEEK IMMEDIATE MEDICAL CARE? You should seek immediate medical care if:  You have sudden severe pain at or below the area of your injury.  You have redness or increased swelling around your injury.  You have tingling or numbness at or below the area of your injury that does not improve after you remove the elastic bandage.   This information is not intended to replace advice given to you by your health care provider. Make sure you discuss any questions you have with your health care provider.   Document Released: 11/02/2000 Document Revised: 04/11/2015 Document Reviewed: 06/28/2014 Elsevier Interactive Patient Education [employer].
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
## 2015-08-06 LAB — CULTURE, BLOOD (ROUTINE X 2)
CULTURE: NO GROWTH
CULTURE: NO GROWTH

## 2015-09-26 ENCOUNTER — Ambulatory Visit: Payer: Medicaid Other | Admitting: Obstetrics and Gynecology

## 2015-09-27 ENCOUNTER — Telehealth: Payer: Self-pay | Admitting: Obstetrics and Gynecology

## 2015-09-27 ENCOUNTER — Ambulatory Visit (INDEPENDENT_AMBULATORY_CARE_PROVIDER_SITE_OTHER): Payer: Medicaid Other | Admitting: Obstetrics and Gynecology

## 2015-09-27 ENCOUNTER — Encounter: Payer: Self-pay | Admitting: Obstetrics and Gynecology

## 2015-09-27 VITALS — BP 110/60 | Ht 65.0 in | Wt 133.5 lb

## 2015-09-27 DIAGNOSIS — R509 Fever, unspecified: Secondary | ICD-10-CM

## 2015-09-27 DIAGNOSIS — N920 Excessive and frequent menstruation with regular cycle: Secondary | ICD-10-CM | POA: Diagnosis not present

## 2015-09-27 DIAGNOSIS — R300 Dysuria: Secondary | ICD-10-CM

## 2015-09-27 DIAGNOSIS — Z711 Person with feared health complaint in whom no diagnosis is made: Secondary | ICD-10-CM

## 2015-09-27 DIAGNOSIS — Z1389 Encounter for screening for other disorder: Secondary | ICD-10-CM | POA: Diagnosis not present

## 2015-09-27 DIAGNOSIS — N898 Other specified noninflammatory disorders of vagina: Secondary | ICD-10-CM

## 2015-09-27 LAB — POCT WET PREP (WET MOUNT): TRICHOMONAS WET PREP HPF POC: NEGATIVE

## 2015-09-27 LAB — POCT URINALYSIS DIPSTICK
GLUCOSE UA: NEGATIVE
Ketones, UA: NEGATIVE
Leukocytes, UA: NEGATIVE
Nitrite, UA: NEGATIVE
PROTEIN UA: NEGATIVE
RBC UA: NEGATIVE

## 2015-09-27 NOTE — Telephone Encounter (Signed)
Pt advised to continue taking antibiotic. Pt verbalized understanding.

## 2015-09-27 NOTE — Progress Notes (Signed)
Patient ID: HILARY TRABERT, female   DOB: 25-Oct-1985, 30 y.o.   MRN: [id number]   [facility] Visit  Patient name: Catherine Ramirez MRN [id number]  Date of birth: 1985/08/14  CC & HPI:  ADALEIGH CLENDENEN is a 30 y.o. female presenting today for recent onset dysuria and fever (Tmax 99.7-100.5 rectal). Pt states she has been taking Azo with no relief of dysuria. She also reports that she has taken Tylenol with temporary relief of fever. She also complains of menorrhagia with her periods lasting 10 days at a time. She states her periods affect intimacy with her husband. Pt states she has some light vaginal discharge, but states this is normal for her. Pt also requests an STD check at this time due to suspicion that her husband cheated on her. Pt also notes that she has recently taken Septra for a UTI.   Per pt, following her bilateral salpingectomy and right oophorectomy on 10/11/2013, she had multiple surgeries to her abdomen and hips. Pt notes she had laparoscopic cholecystectomy on 11/11/14 followed by accessory leak and subsequent ERCP with stent placement on 11/18/13. Pt notes she noticed her bile duct leakage symptoms within a week of her cholecystectomy. She then had ERCP with stent placement and sphincterectomy on 01/13/14. She notes that she then developed infections in her hips s/p pelvic fracture, chronic infection of pin sites, and had I&D of infections in her right hip on 02/26/15. Pt states that in 05/2015 she had surgery on her left hip and became septic following this procedure. She notes she is followed by infectious disease at [facility] for her bone infections. She also notes she has had blood transfusions.   ROS:  A complete 10 system review of systems was obtained and all systems are negative except as noted in the HPI and PMH.    Pertinent History Reviewed:   Reviewed: Significant for bilateral salpingectomy and right oophorectomy 10/2013, cesarean section, tubal ligation   Medical          Past Medical History  Diagnosis Date  . Arrhythmia   . Chronic pain syndrome   . IBS (irritable bowel syndrome)   . Chronic constipation   . Chronic pelvic pain in female   . Osteomyelitis ([place])     bilateral hips  . 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 Cesarean section    . Pelvic fracture surgery    . Tonsillectomy    . Tubal ligation    . Ovarian cyst removal Left   . Laparoscopic unilateral salpingo oopherectomy Left 10/11/2013    Procedure:  LAPAROSCOPIC LEFT SALPINGO OOPHORECTOMY;  Surgeon: [name], MD;  Location: [facility];  Service: Gynecology;  Laterality: Left;  . Bilateral salpingectomy Right 10/11/2013    Procedure: RIGHT SALPINGECTOMY;  Surgeon: [name], MD;  Location: [facility];  Service: Gynecology;  Laterality: Right;  . Diagnostic laparoscopy with removal of ectopic pregnancy N/A 10/15/2013    Procedure: DIAGNOSTIC LAPAROSCOPY ;  Surgeon: [name], MD;  Location: [facility];  Service: Gynecology;  Laterality: N/A;  . Cholecystectomy N/A 11/10/2013    Procedure: LAPAROSCOPIC CHOLECYSTECTOMY;  Surgeon: Scherry Ran, MD;  Location: [facility];  Service: General;  Laterality:  N/A;  . Ercp N/A 11/18/2013    Procedure: ENDOSCOPIC RETROGRADE CHOLANGIOPANCREATOGRAPHY (ERCP) WITH SMALL SPHINCTEROTOMY;  Surgeon: Rogene Houston, MD;  Location: [facility];  Service: Endoscopy;  Laterality: N/A;  . Ercp N/A 01/13/2014    Procedure: ENDOSCOPIC RETROGRADE CHOLANGIOPANCREATOGRAPHY (ERCP);  Surgeon: Rogene Houston, MD;  Location: [facility];  Service: Endoscopy;  Laterality: N/A;  . Stent removal N/A 01/13/2014    Procedure: STENT REMOVAL;  Surgeon: Rogene Houston, MD;  [facility]: [facility];  Service: Endoscopy;  Laterality: N/A;  . Sphincterotomy N/A 01/13/2014    Procedure: SPHINCTEROTOMY;  Surgeon: Rogene Houston, MD;  Location: [facility];  Service: Endoscopy;  Laterality:  N/A;  . Fracture surgery    . Abdominal hysterectomy     Medications: Reviewed & Updated - see associated section                       Current outpatient prescriptions:  .  acetaminophen (TYLENOL) 325 MG tablet, Take 2 tablets (650 mg total) by mouth every 6 (six) hours as needed for mild pain (or Fever >/= 101)., Disp: 30 tablet, Rfl: 0 .  ibuprofen (ADVIL,MOTRIN) 200 MG tablet, Take 3 tablets (600 mg total) by mouth every 8 (eight) hours as needed for moderate pain., Disp: 30 tablet, Rfl: 0 .  LINZESS 145 MCG CAPS capsule, TAKE 1 CAPSULE BY MOUTH ONCE DAILY., Disp: 30 capsule, Rfl: 5 .  oxyCODONE (ROXICODONE) 5 MG immediate release tablet, Take 2 tablets (10 mg total) by mouth every 4 (four) hours as needed for severe pain. (Patient taking differently: Take 5 mg by mouth every 3 (three) hours. ), Disp: 30 tablet, Rfl: 0 .  Oxycodone HCl 20 MG TABS, Take 1 tablet by mouth every 4 (four) hours. Take every day per patient, Disp: , Rfl:  .  diphenhydrAMINE (BENADRYL) 25 mg capsule, Take 1 capsule (25 mg total) by mouth every 4 (four) hours as needed for itching. (Patient not taking: Reported on 09/27/2015), Disp: 30 capsule, Rfl: 0 .  EPINEPHrine (EPIPEN) 0.3 mg/0.3 mL DEVI, Inject 0.3 mg into the muscle once. Reported on 09/27/2015, Disp: , Rfl:  .  ferrous sulfate 325 (65 FE) MG EC tablet, Take 325 mg by mouth daily with breakfast. Reported on 09/27/2015, Disp: , Rfl:  .  pantoprazole (PROTONIX) 40 MG tablet, Take 40 mg by mouth daily. Reported on 09/27/2015, Disp: , Rfl:  .  Vancomycin (VANCOCIN) 750 MG/150ML SOLN, Inject 150 mLs (750 mg total) into the vein every 8 (eight) hours. (Patient not taking: Reported on 09/27/2015), Disp: 4000 mL, Rfl: 0 No current facility-administered medications for this visit.  Facility-Administered Medications Ordered in Other Visits:  .  dextrose 5 %-0.45 % sodium chloride infusion, , Intravenous, Continuous, [name], MD, Last Rate: 100 mL/hr at 10/12/13  1714 .  pneumococcal 23 valent vaccine (PNU-IMMUNE) injection 0.5 mL, 0.5 mL, Intramuscular, Tomorrow-1000, [name], MD   Social History: Reviewed -  reports that she has been smoking Cigarettes.  She has been smoking about 0.00 packs per day for the past 2 years. She has never used smokeless tobacco.  Objective Findings:  Vitals: Blood pressure 110/60, height 5\' 5"  (1.651 m), weight 133 lb 8 oz (60.555 kg).  Physical Examination: General appearance - alert, well appearing, and in no distress Abdomen - soft, nontender, nondistended, no masses or organomegaly             Scars over both anterior superior iliac crests,  L>>R from multiple surgeries. No obvious infection sites. Pelvic - normal external genitalia, vulva, vagina, cervix, uterus and adnexa, Well healed incisions on iliac crest. VULVA: normal appearing vulva with no masses, tenderness or lesions,  VAGINA: normal appearing vagina with normal color and discharge, no lesions, Normal appearing secretions. CERVIX: normal appearing cervix without discharge or lesions,  UTERUS: uterus is normal size, small, shape, consistency and nontender,  ADNEXA: normal adnexa in size, nontender and no masses. Left ovary is palpable and of normal size. Right ovary surgically removed.   Discussed with pt medical and surgical history. Also discussed possibility of IUD, novasure endometrial ablation to alleviate menorrhagia, to be further discussed at follow up. At end of discussion, pt had opportunity to ask questions and has no further questions at this time.   Greater than 50% was spent in counseling and coordination of care with the patient. Total time greater than: 45 minutes   Assessment & Plan:   A:  1. Menorrhagia  2. Dysuria  3. 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 Wet prep and KOH obtained- negative for trichomoniasis, BV  5. UA negative  P:  1. F/u in 1 week to further discuss options regarding menorrhagia.  2. Order Sed  rate, CRP, CBC with differential, STD testing w/ GC/CHL, HIV, RPR, Hepatitis B&C     By signing my name below, I, Hansel Feinstein, attest that this documentation has been prepared under the direction and in the presence of [name], MD. Electronically Signed: Hansel Feinstein, ED Scribe. 09/27/2015. 9:45 AM.  I personally performed the services described in this documentation, which was SCRIBED in my presence. The recorded information has been reviewed and considered accurate. It has been edited as necessary during review. [name], MD

## 2015-09-28 LAB — CBC WITH DIFFERENTIAL/PLATELET
BASOS ABS: 0 10*3/uL (ref 0.0–0.2)
BASOS: 0 %
EOS (ABSOLUTE): 0.1 10*3/uL (ref 0.0–0.4)
Eos: 1 %
HEMOGLOBIN: 12.1 g/dL (ref 11.1–15.9)
Hematocrit: 37.5 % (ref 34.0–46.6)
IMMATURE GRANS (ABS): 0 10*3/uL (ref 0.0–0.1)
Immature Granulocytes: 0 %
LYMPHS: 17 %
Lymphocytes Absolute: 1.7 10*3/uL (ref 0.7–3.1)
MCH: 27.1 pg (ref 26.6–33.0)
MCHC: 32.3 g/dL (ref 31.5–35.7)
MCV: 84 fL (ref 79–97)
MONOCYTES: 6 %
Monocytes Absolute: 0.6 10*3/uL (ref 0.1–0.9)
NEUTROS ABS: 7.8 10*3/uL — AB (ref 1.4–7.0)
Neutrophils: 76 %
Platelets: 250 10*3/uL (ref 150–379)
RBC: 4.46 x10E6/uL (ref 3.77–5.28)
RDW: 19.5 % — ABNORMAL HIGH (ref 12.3–15.4)
WBC: 10.2 10*3/uL (ref 3.4–10.8)

## 2015-09-28 LAB — HEPATITIS B SURFACE ANTIGEN: HEP B S AG: NEGATIVE

## 2015-09-28 LAB — HEPATITIS C ANTIBODY

## 2015-09-28 LAB — C-REACTIVE PROTEIN: CRP: 19.6 mg/L — AB (ref 0.0–4.9)

## 2015-09-29 LAB — GC/CHLAMYDIA PROBE AMP
Chlamydia trachomatis, NAA: NEGATIVE
Neisseria gonorrhoeae by PCR: NEGATIVE

## 2015-10-04 ENCOUNTER — Telehealth: Payer: Self-pay | Admitting: Obstetrics and Gynecology

## 2015-10-04 ENCOUNTER — Ambulatory Visit: Payer: Medicaid Other | Admitting: Obstetrics and Gynecology

## 2015-10-04 ENCOUNTER — Encounter: Payer: Self-pay | Admitting: Obstetrics and Gynecology

## 2015-10-08 ENCOUNTER — Ambulatory Visit: Payer: Medicaid Other | Admitting: Obstetrics and Gynecology

## 2015-10-08 NOTE — Telephone Encounter (Signed)
Pt states that she just wanted to know her results. Pt aware of results. Pt states that her grandfather fell earlier today and is going to have emergency surgery at 1:00 today and she will need to reschedule her appointment for this afternoon. Phone call was switched to front office and appointment was given to work around pt's schedule.

## 2015-10-12 ENCOUNTER — Ambulatory Visit: Payer: Medicaid Other | Admitting: Obstetrics and Gynecology

## 2015-10-12 ENCOUNTER — Encounter: Payer: Self-pay | Admitting: *Deleted

## 2015-10-17 ENCOUNTER — Ambulatory Visit: Payer: Medicaid Other | Admitting: Obstetrics and Gynecology

## 2015-10-19 ENCOUNTER — Ambulatory Visit (INDEPENDENT_AMBULATORY_CARE_PROVIDER_SITE_OTHER): Payer: Medicaid Other | Admitting: Obstetrics and Gynecology

## 2015-10-19 ENCOUNTER — Encounter: Payer: Self-pay | Admitting: Obstetrics and Gynecology

## 2015-10-19 VITALS — BP 130/80 | Wt 131.0 lb

## 2015-10-19 DIAGNOSIS — N920 Excessive and frequent menstruation with regular cycle: Secondary | ICD-10-CM

## 2015-10-19 NOTE — Progress Notes (Signed)
[facility] Visit  Patient name: Catherine Ramirez MRN [id number]  Date of birth: Sep 04, 1985  CC & HPI:  Catherine Ramirez is a 30 y.o. female presenting today for follow up on menorrhagia. Pt notes that she followed up with Infectious disease at [facility] for further blood work. Pt wants to know pros and cons of IUD and endometrial ablation. Pt states that if she were to have an endometrial ablation, she would like it before June 2017 when her insurance lapses. Pt notes that she has one child. Pt denies any other symptoms. Denies being on any contraceptive at this time.   ROS:  +Menorrhagia  Pertinent History Reviewed:   Reviewed: Significant for chronic pelvic pain, osteomyelitis, Medical         Past Medical History  Diagnosis Date   Arrhythmia    Chronic pain syndrome    IBS (irritable bowel syndrome)    Chronic constipation    Chronic pelvic pain in female    Osteomyelitis ([place])     bilateral hips   Bone abscess ([place]) 06/12/2015    Left iliac wing                              Surgical Hx:    Past Surgical History  Procedure Laterality Date   Cesarean section     Pelvic fracture surgery     Tonsillectomy     Tubal ligation     Ovarian cyst removal Left    Laparoscopic unilateral salpingo oopherectomy Left 10/11/2013    Procedure:  LAPAROSCOPIC LEFT SALPINGO OOPHORECTOMY;  Surgeon: [name], MD;  [facility];  Service: Gynecology;  Laterality: Left;   Bilateral salpingectomy Right 10/11/2013    Procedure: RIGHT SALPINGECTOMY;  Surgeon: [name], MD;  [facility]: [facility];  Service: Gynecology;  Laterality: Right;   Diagnostic laparoscopy with removal of ectopic pregnancy N/A 10/15/2013    Procedure: DIAGNOSTIC LAPAROSCOPY ;  Surgeon: [name], MD;  [facility]: [facility];  Service: Gynecology;  Laterality: N/A;   Cholecystectomy N/A 11/10/2013    Procedure: LAPAROSCOPIC CHOLECYSTECTOMY;  Surgeon: Scherry Ran, MD;  Location: [facility];   Service: General;  Laterality: N/A;   Ercp N/A 11/18/2013    Procedure: ENDOSCOPIC RETROGRADE CHOLANGIOPANCREATOGRAPHY (ERCP) WITH SMALL SPHINCTEROTOMY;  Surgeon: Rogene Houston, MD;  Location: [facility];  Service: Endoscopy;  Laterality: N/A;   Ercp N/A 01/13/2014    Procedure: ENDOSCOPIC RETROGRADE CHOLANGIOPANCREATOGRAPHY (ERCP);  Surgeon: Rogene Houston, MD;  Location: [facility];  Service: Endoscopy;  Laterality: N/A;   Stent removal N/A 01/13/2014    Procedure: STENT REMOVAL;  Surgeon: Rogene Houston, MD;  [facility]: [facility];  Service: Endoscopy;  Laterality: N/A;   Sphincterotomy N/A 01/13/2014    Procedure: SPHINCTEROTOMY;  Surgeon: Rogene Houston, MD;  Location: [facility];  Service: Endoscopy;  Laterality: N/A;   Fracture surgery     Abdominal hysterectomy     Medications: Reviewed & Updated - see associated section                       Current outpatient prescriptions:    acetaminophen (TYLENOL) 325 MG tablet, Take 2 tablets (650 mg total) by mouth every 6 (six) hours as needed for mild pain (or Fever >/= 101)., Disp: 30 tablet, Rfl: 0   diphenhydrAMINE (BENADRYL) 25 mg capsule, Take 1 capsule (25 mg total) by mouth every  4 (four) hours as needed for itching. (Patient not taking: Reported on 09/27/2015), Disp: 30 capsule, Rfl: 0   EPINEPHrine (EPIPEN) 0.3 mg/0.3 mL DEVI, Inject 0.3 mg into the muscle once. Reported on 09/27/2015, Disp: , Rfl:    ferrous sulfate 325 (65 FE) MG EC tablet, Take 325 mg by mouth daily with breakfast. Reported on 09/27/2015, Disp: , Rfl:    ibuprofen (ADVIL,MOTRIN) 200 MG tablet, Take 3 tablets (600 mg total) by mouth every 8 (eight) hours as needed for moderate pain., Disp: 30 tablet, Rfl: 0   LINZESS 145 MCG CAPS capsule, TAKE 1 CAPSULE BY MOUTH ONCE DAILY., Disp: 30 capsule, Rfl: 5   oxyCODONE (ROXICODONE) 5 MG immediate release tablet, Take 2 tablets (10 mg total) by mouth every 4 (four) hours as needed for severe pain. (Patient taking differently: Take 5  mg by mouth every 3 (three) hours. ), Disp: 30 tablet, Rfl: 0   Oxycodone HCl 20 MG TABS, Take 1 tablet by mouth every 4 (four) hours. Take every day per patient, Disp: , Rfl:    pantoprazole (PROTONIX) 40 MG tablet, Take 40 mg by mouth daily. Reported on 09/27/2015, Disp: , Rfl:    Vancomycin (VANCOCIN) 750 MG/150ML SOLN, Inject 150 mLs (750 mg total) into the vein every 8 (eight) hours. (Patient not taking: Reported on 09/27/2015), Disp: 4000 mL, Rfl: 0 No current facility-administered medications for this visit.  Facility-Administered Medications Ordered in Other Visits:    dextrose 5 %-0.45 % sodium chloride infusion, , Intravenous, Continuous, [name], MD, Last Rate: 100 mL/hr at 10/12/13 1714   pneumococcal 23 valent vaccine (PNU-IMMUNE) injection 0.5 mL, 0.5 mL, Intramuscular, Tomorrow-1000, [name], MD   Social History: Reviewed -  reports that she has been smoking Cigarettes.  She has been smoking about 0.00 packs per day for the past 2 years. She has never used smokeless tobacco.  Objective Findings:  Vitals: Blood pressure 130/80, weight 131 lb (59.421 kg).  Physical Examination:  General appearance - alert, well appearing, and in no distress and oriented to person, place, and time Mental status - alert, oriented to person, place, and time, normal mood, behavior, speech, dress, motor activity, and thought processes  Discussed with pt risks and benefits of IUD vs endometrium ablation. At end of discussion, pt had opportunity to ask questions and has no further questions at this time.   Greater than 50% was spent in counseling and coordination of care with the patient. Total time greater than: 20 minutes   Assessment & Plan:   A:  1. Menorrhagia 2. Pt not interested in IUD  P:  1. Schedule pelvic US prior to endometrium ablation procedure   By signing my name below, I, [name], attest that this documentation has been prepared under the direction and  in the presence of [name], MD. Electronically Signed: [name], ED Scribe. 10/19/2015. 1:28 PM.  I personally performed the services described in this documentation, which was SCRIBED in my presence. The recorded information has been reviewed and considered accurate. It has been edited as necessary during review. [name], MD

## 2015-10-21 DIAGNOSIS — N920 Excessive and frequent menstruation with regular cycle: Secondary | ICD-10-CM | POA: Insufficient documentation

## 2015-10-26 ENCOUNTER — Other Ambulatory Visit: Payer: Self-pay | Admitting: Obstetrics and Gynecology

## 2015-10-26 DIAGNOSIS — N921 Excessive and frequent menstruation with irregular cycle: Secondary | ICD-10-CM

## 2015-10-29 ENCOUNTER — Ambulatory Visit: Payer: Medicaid Other | Admitting: Obstetrics and Gynecology

## 2015-10-29 ENCOUNTER — Other Ambulatory Visit: Payer: Medicaid Other

## 2015-11-12 ENCOUNTER — Other Ambulatory Visit: Payer: Medicaid Other

## 2015-11-12 ENCOUNTER — Ambulatory Visit: Payer: Medicaid Other | Admitting: Obstetrics and Gynecology

## 2015-11-21 ENCOUNTER — Ambulatory Visit (INDEPENDENT_AMBULATORY_CARE_PROVIDER_SITE_OTHER): Payer: Medicaid Other

## 2015-11-21 ENCOUNTER — Ambulatory Visit (INDEPENDENT_AMBULATORY_CARE_PROVIDER_SITE_OTHER): Payer: Medicaid Other | Admitting: Obstetrics and Gynecology

## 2015-11-21 ENCOUNTER — Encounter: Payer: Self-pay | Admitting: Obstetrics and Gynecology

## 2015-11-21 VITALS — BP 100/70 | Ht 65.0 in | Wt 125.0 lb

## 2015-11-21 DIAGNOSIS — N854 Malposition of uterus: Secondary | ICD-10-CM | POA: Diagnosis not present

## 2015-11-21 DIAGNOSIS — N921 Excessive and frequent menstruation with irregular cycle: Secondary | ICD-10-CM | POA: Diagnosis not present

## 2015-11-21 DIAGNOSIS — N920 Excessive and frequent menstruation with regular cycle: Secondary | ICD-10-CM

## 2015-11-21 NOTE — Progress Notes (Signed)
PELVIC US TV/TA: homogenous anteverted uterus,EEC 70mm,normal rt ovary,lt adnexa wnl

## 2015-11-21 NOTE — Progress Notes (Signed)
Patient ID: Catherine Ramirez, female   DOB: 24-Jun-1986, 30 y.o.   MRN: [id number] Pt here today for follow up, had [place] and then to get results.

## 2015-11-21 NOTE — Progress Notes (Signed)
[facility] Visit  @DATE @            Patient name: MAGALY MCCLURE MRN [id number]  Date of birth: 07-21-1986  CC & HPI:  ADOREE LUBER is a 30 y.o. female presenting today for a follow-up discussion of chronic menorrhagia. She reports she had a pevlic u/s today that was unremarkable. She desires having an endometrial ablation. Per medical records, patient does not desire an IUD. Currently Menses are at the end of the month.   ROS:  Review of Systems  Genitourinary:       Positive for menorrhagia.   All other systems reviewed and are negative.  Pertinent History Reviewed:   Reviewed: Significant for menorrhagia Medical         Past Medical History  Diagnosis Date  . Arrhythmia   . Chronic pain syndrome   . IBS (irritable bowel syndrome)   . Chronic constipation   . Chronic pelvic pain in female   . Osteomyelitis ([place])     bilateral hips  . Bone abscess ([place]) 06/12/2015    Left iliac wing                              Surgical Hx:    Past Surgical History  Procedure Laterality Date  . Cesarean section    . Pelvic fracture surgery    . Tonsillectomy    . Tubal ligation    . Ovarian cyst removal Left   . Laparoscopic unilateral salpingo oopherectomy Left 10/11/2013    Procedure:  LAPAROSCOPIC LEFT SALPINGO OOPHORECTOMY;  Surgeon: [name], MD;  [facility];  Service: Gynecology;  Laterality: Left;  . Bilateral salpingectomy Right 10/11/2013    Procedure: RIGHT SALPINGECTOMY;  Surgeon: [name], MD;  [facility];  Service: Gynecology;  Laterality: Right;  . Diagnostic laparoscopy with removal of ectopic pregnancy N/A 10/15/2013    Procedure: DIAGNOSTIC LAPAROSCOPY ;  Surgeon: [name], MD;  [facility];  Service: Gynecology;  Laterality: N/A;  . Cholecystectomy N/A 11/10/2013    Procedure: LAPAROSCOPIC CHOLECYSTECTOMY;  Surgeon: Scherry Ran, MD;  Location: [facility];  Service: General;  Laterality: N/A;  . Ercp N/A 11/18/2013     Procedure: ENDOSCOPIC RETROGRADE CHOLANGIOPANCREATOGRAPHY (ERCP) WITH SMALL SPHINCTEROTOMY;  Surgeon: Rogene Houston, MD;  Location: [facility];  Service: Endoscopy;  Laterality: N/A;  . Ercp N/A 01/13/2014    Procedure: ENDOSCOPIC RETROGRADE CHOLANGIOPANCREATOGRAPHY (ERCP);  Surgeon: Rogene Houston, MD;  Location: [facility];  Service: Endoscopy;  Laterality: N/A;  . Stent removal N/A 01/13/2014    Procedure: STENT REMOVAL;  Surgeon: Rogene Houston, MD;  [facility]: [facility];  Service: Endoscopy;  Laterality: N/A;  . Sphincterotomy N/A 01/13/2014    Procedure: SPHINCTEROTOMY;  Surgeon: Rogene Houston, MD;  Location: [facility];  Service: Endoscopy;  Laterality: N/A;  . Fracture surgery    . Abdominal hysterectomy     Medications: Reviewed & Updated - see associated section                       Current outpatient prescriptions:  .  acetaminophen (TYLENOL) 325 MG tablet, Take 2 tablets (650 mg total) by mouth every 6 (six) hours as needed for mild pain (or Fever >/= 101)., Disp: 30 tablet, Rfl: 0 .  diphenhydrAMINE (BENADRYL) 25 mg capsule, Take 1 capsule (25 mg total) by mouth every 4 (  four) hours as needed for itching. (Patient not taking: Reported on 09/27/2015), Disp: 30 capsule, Rfl: 0 .  EPINEPHrine (EPIPEN) 0.3 mg/0.3 mL DEVI, Inject 0.3 mg into the muscle once. Reported on 09/27/2015, Disp: , Rfl:  .  ferrous sulfate 325 (65 FE) MG EC tablet, Take 325 mg by mouth daily with breakfast. Reported on 09/27/2015, Disp: , Rfl:  .  ibuprofen (ADVIL,MOTRIN) 200 MG tablet, Take 3 tablets (600 mg total) by mouth every 8 (eight) hours as needed for moderate pain., Disp: 30 tablet, Rfl: 0 .  LINZESS 145 MCG CAPS capsule, TAKE 1 CAPSULE BY MOUTH ONCE DAILY., Disp: 30 capsule, Rfl: 5 .  oxyCODONE (ROXICODONE) 5 MG immediate release tablet, Take 2 tablets (10 mg total) by mouth every 4 (four) hours as needed for severe pain. (Patient taking differently: Take 5 mg by mouth every 3 (three) hours. ), Disp: 30 tablet, Rfl:  0 .  Oxycodone HCl 20 MG TABS, Take 1 tablet by mouth every 4 (four) hours. Take every day per patient, Disp: , Rfl:  .  pantoprazole (PROTONIX) 40 MG tablet, Take 40 mg by mouth daily. Reported on 09/27/2015, Disp: , Rfl:  .  Vancomycin (VANCOCIN) 750 MG/150ML SOLN, Inject 150 mLs (750 mg total) into the vein every 8 (eight) hours. (Patient not taking: Reported on 09/27/2015), Disp: 4000 mL, Rfl: 0 No current facility-administered medications for this visit.  Facility-Administered Medications Ordered in Other Visits:  .  dextrose 5 %-0.45 % sodium chloride infusion, , Intravenous, Continuous, [name], MD, Last Rate: 100 mL/hr at 10/12/13 1714 .  pneumococcal 23 valent vaccine (PNU-IMMUNE) injection 0.5 mL, 0.5 mL, Intramuscular, Tomorrow-1000, [name], MD   Social History: Reviewed -  reports that she has been smoking Cigarettes.  She has been smoking about 0.00 packs per day for the past 2 years. She has never used smokeless tobacco.  Objective Findings:  Vitals: Blood pressure 100/70, height 5\' 5"  (1.651 m), weight 125 lb (56.7 kg), last menstrual period 11/01/2015.  Physical Examination:   Discussed with pt risks and benefits of endometrial ablation. At end of discussion, pt had opportunity to ask questions and has no further questions at this time.   Greater than 50% was spent in counseling and coordination of care with the patient. Total time greater than: 15 minutes    Assessment & Plan:   A:  1. Menorrhagia. 2. Normal pelvic ultrasound conducted today.  P:  1. Endometrial ablation  Pt prefers for early June. Prefers Friday, 6/9. 2. Follow up in mid-late May in 4-5 weeks for pre-op visit.     By signing my name below, I, Stephania Fragmin, attest that this documentation has been prepared under the direction and in the presence of [name], MD. Electronically Signed: Stephania Fragmin, ED Scribe. 11/21/2015. 4:21 PM.  I personally performed the services described in  this documentation, which was SCRIBED in my presence. The recorded information has been reviewed and considered accurate. It has been edited as necessary during review. [name], MD   I personally performed the services described in this documentation, which was SCRIBED in my presence. The recorded information has been reviewed and considered accurate. It has been edited as necessary during review. [name], MD

## 2015-12-21 ENCOUNTER — Encounter: Payer: Medicaid Other | Admitting: Obstetrics and Gynecology

## 2015-12-24 ENCOUNTER — Encounter (INDEPENDENT_AMBULATORY_CARE_PROVIDER_SITE_OTHER): Payer: Self-pay | Admitting: Internal Medicine

## 2015-12-26 ENCOUNTER — Encounter: Payer: Medicaid Other | Admitting: Obstetrics and Gynecology

## 2015-12-26 ENCOUNTER — Encounter: Payer: Self-pay | Admitting: Obstetrics and Gynecology

## 2016-01-07 ENCOUNTER — Other Ambulatory Visit (HOSPITAL_COMMUNITY): Payer: Medicaid Other

## 2016-01-07 ENCOUNTER — Telehealth: Payer: Self-pay | Admitting: Obstetrics and Gynecology

## 2016-01-07 NOTE — Telephone Encounter (Signed)
Pt has cancelled her surgery for 6/9. She desired surgery by end of June. No followup noted in Epic. Is there a followup planned by pt?

## 2016-01-09 ENCOUNTER — Other Ambulatory Visit: Payer: Self-pay | Admitting: Obstetrics and Gynecology

## 2016-01-10 ENCOUNTER — Other Ambulatory Visit (HOSPITAL_COMMUNITY): Payer: Medicaid Other

## 2016-01-11 ENCOUNTER — Encounter (HOSPITAL_COMMUNITY): Admission: RE | Payer: Self-pay | Source: Ambulatory Visit

## 2016-01-11 ENCOUNTER — Ambulatory Visit (HOSPITAL_COMMUNITY)
Admission: RE | Admit: 2016-01-11 | Payer: Medicaid Other | Source: Ambulatory Visit | Admitting: Obstetrics and Gynecology

## 2016-01-11 SURGERY — DILATATION & CURETTAGE/HYSTEROSCOPY WITH NOVASURE ABLATION
Anesthesia: Choice

## 2016-01-21 ENCOUNTER — Encounter: Payer: Medicaid Other | Admitting: Obstetrics and Gynecology

## 2016-03-21 ENCOUNTER — Encounter (INDEPENDENT_AMBULATORY_CARE_PROVIDER_SITE_OTHER): Payer: Self-pay | Admitting: Internal Medicine

## 2016-03-21 ENCOUNTER — Ambulatory Visit (INDEPENDENT_AMBULATORY_CARE_PROVIDER_SITE_OTHER): Payer: Medicaid Other | Admitting: Internal Medicine

## 2016-03-25 IMAGING — CR DG CHEST 2V
2 series · 2 of 2 positions shown · non-contrast
Comparison: 05/05/2015

CLINICAL DATA: Patient thinks that part of her PICC line is still
in her body. Patient states that she is 36cm cath, and the part that
came out is only 33cm. The catheter also appears to be cut at an
angle.

EXAM:
CHEST  2 VIEW

[chest pa]
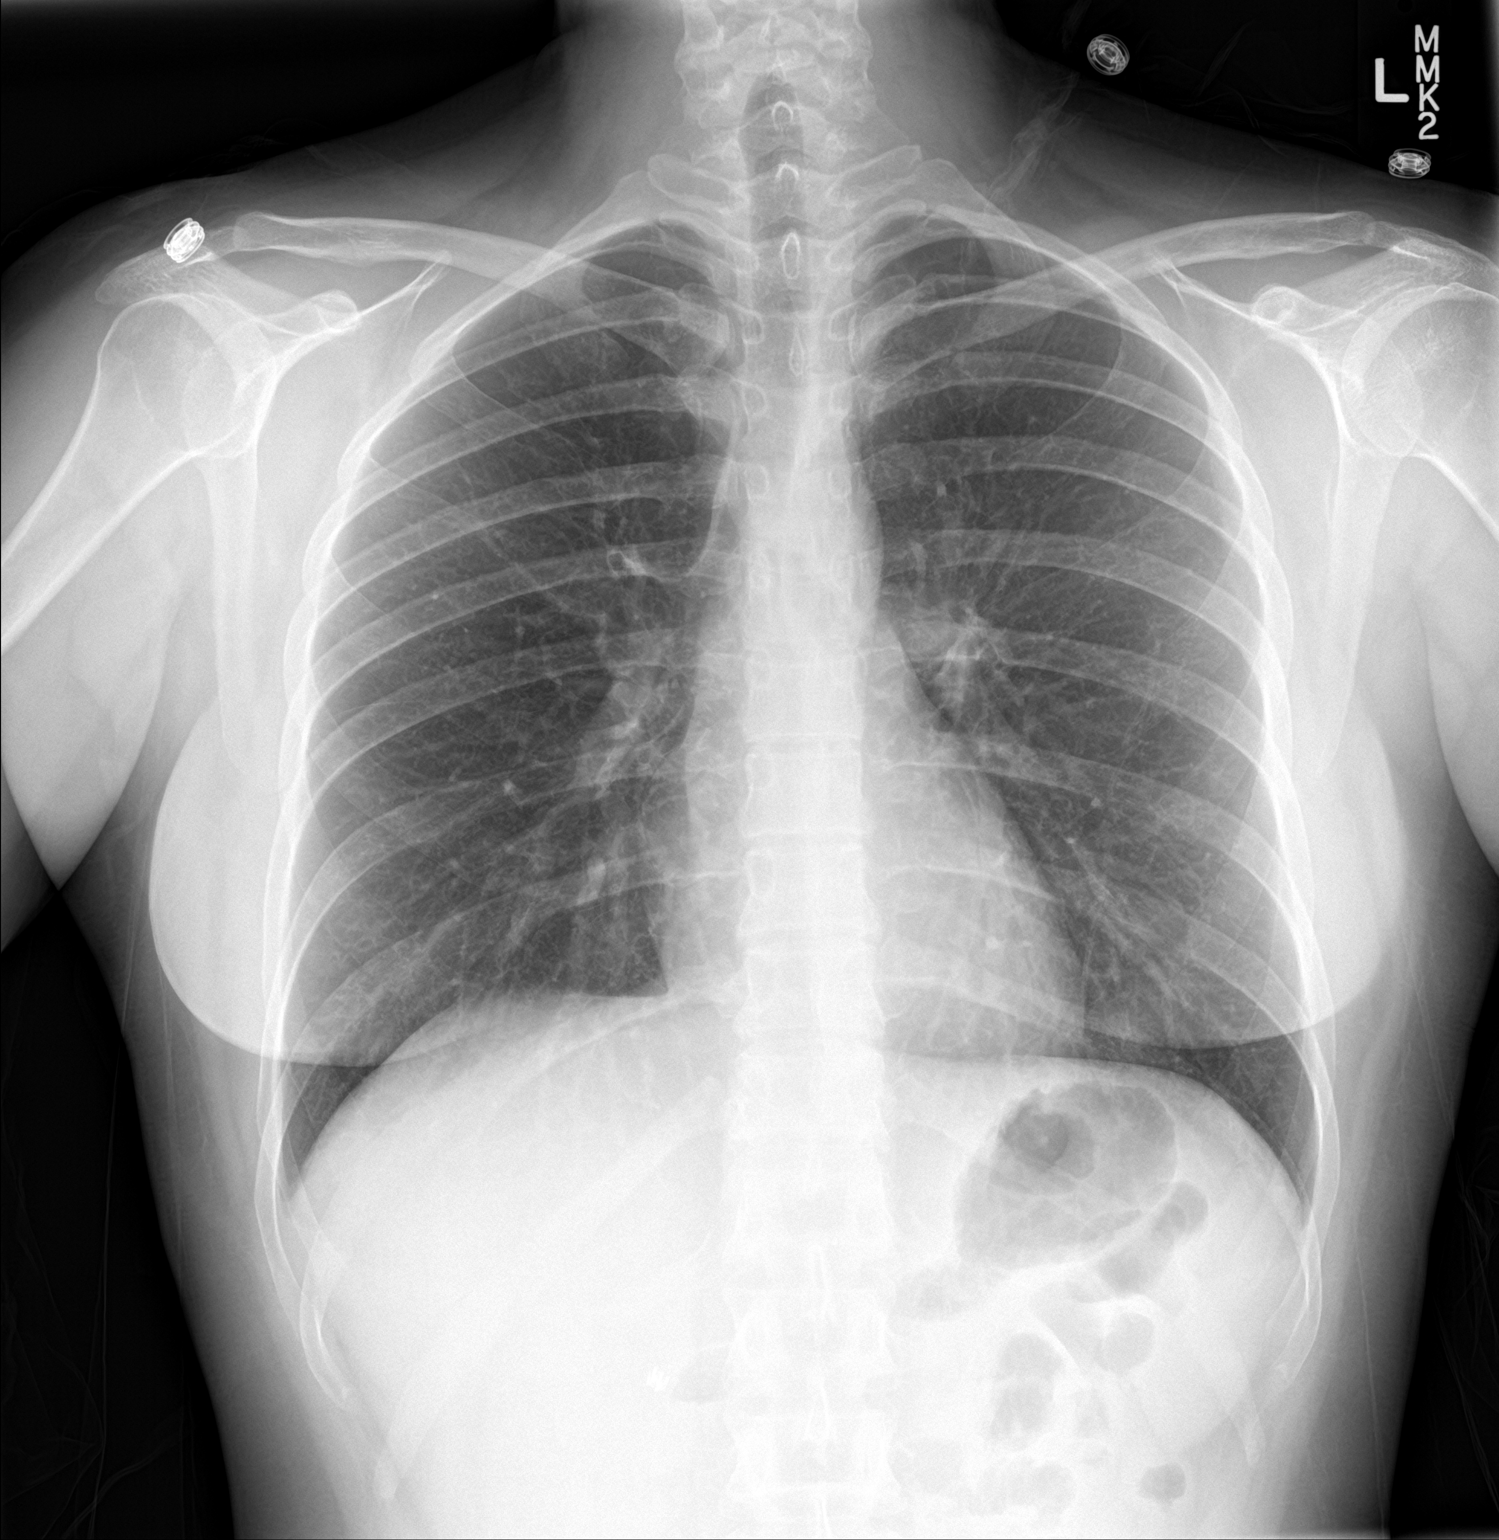

[chest lat]
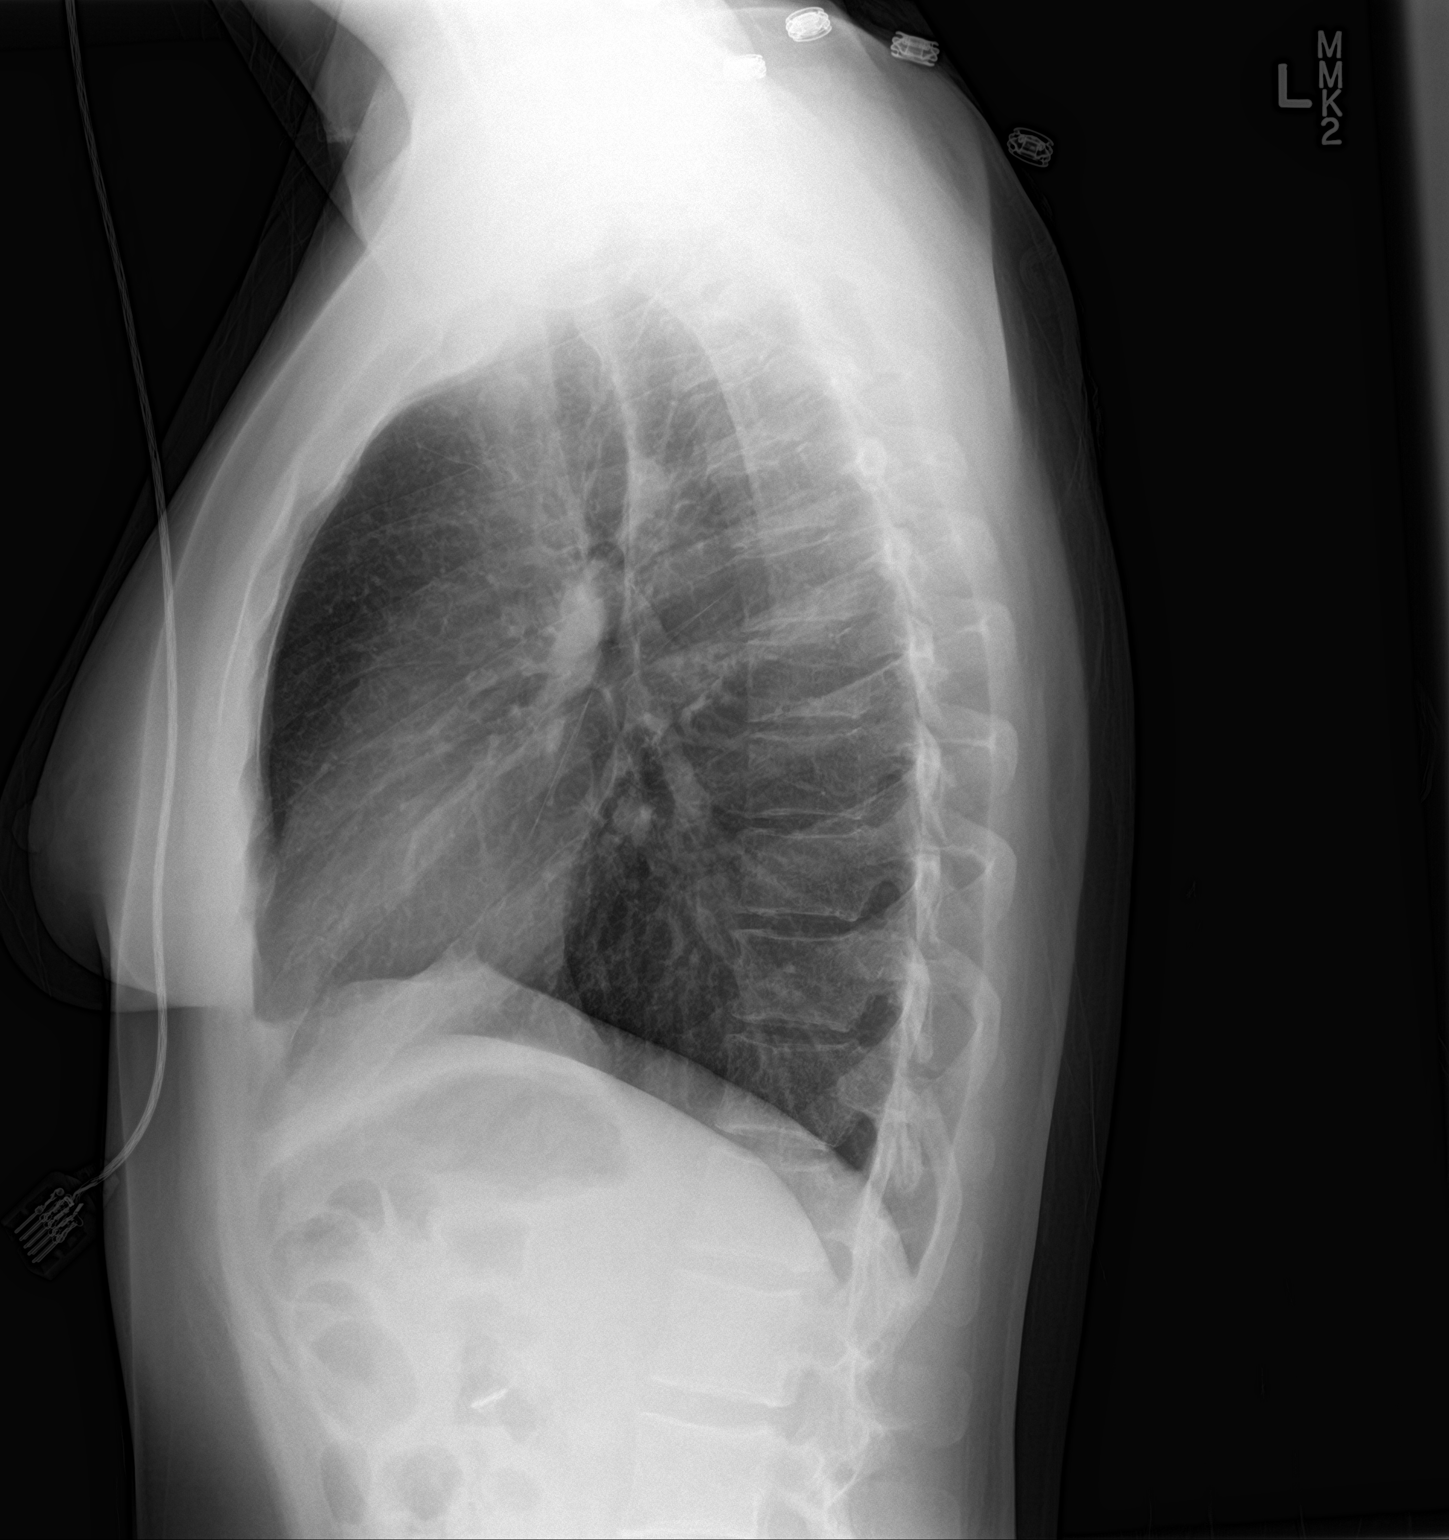

[2 of 2 positions shown; findings below may reference images not displayed]

FINDINGS: No radiopaque foreign body to suggest retained catheter. The
cardiomediastinal contours are normal. The lungs are clear.
Pulmonary vasculature is normal. No consolidation, pleural effusion,
or pneumothorax. No acute osseous abnormalities are seen. Surgical
clips in the right upper quadrant of the abdomen from
cholecystectomy again seen.
IMPRESSION: 1. No radiopaque foreign body to suggest retained catheter.
2.  No acute pulmonary process.

## 2016-03-26 IMAGING — XA IR US GUIDE VASC ACCESS RIGHT
1 series · 2 of 2 positions shown · non-contrast
Comparison: none

CLINICAL DATA: HIP OSTEOMYELITIS, ACCESS FOR IV ANTIBIOTICS

[Series 1: run · 2 of 2 slices shown]
[im 1/2]
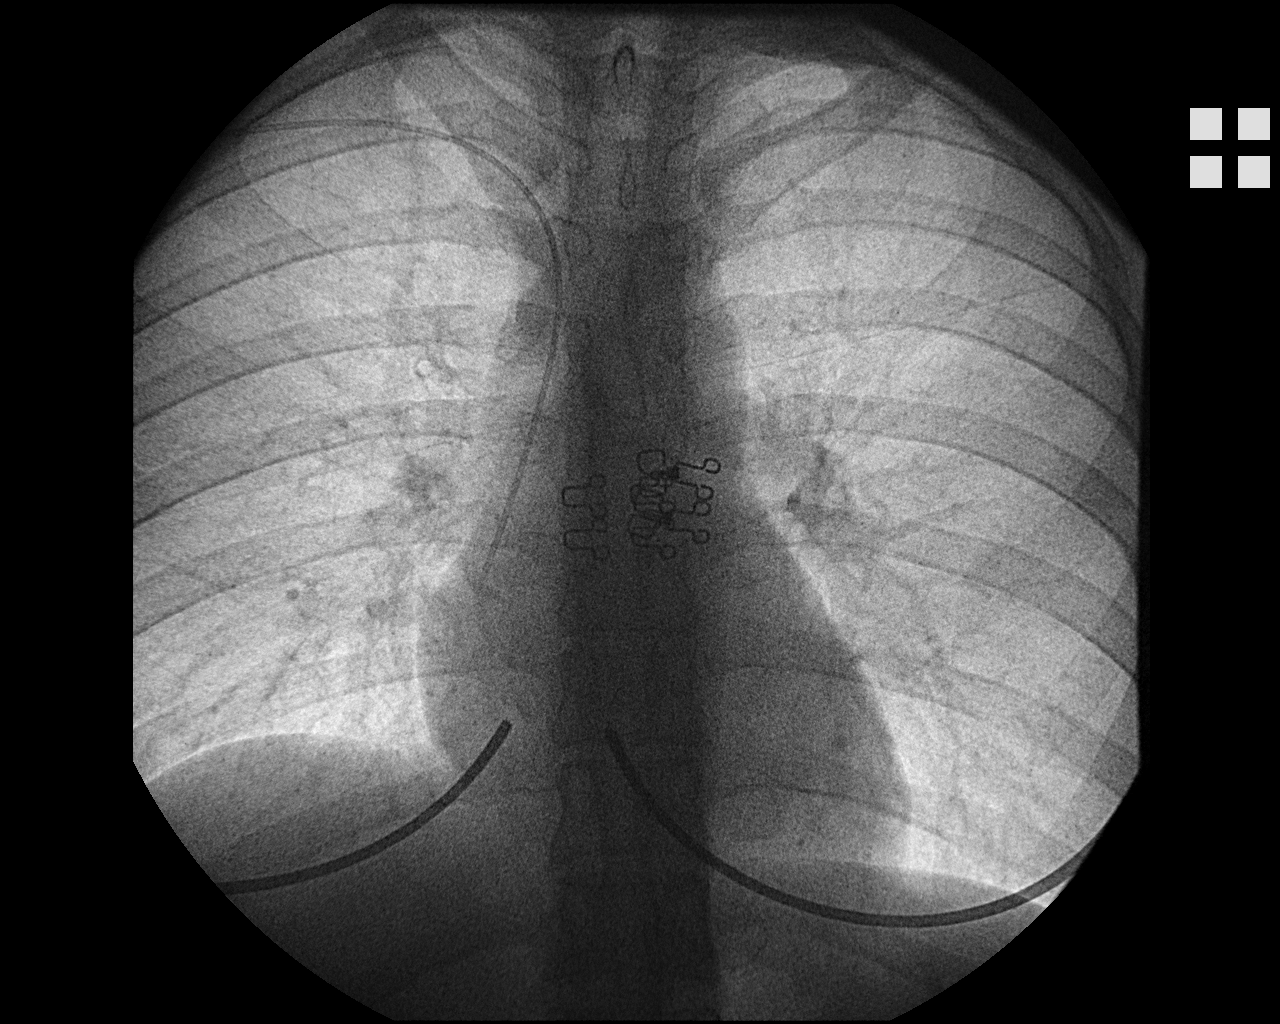
[im 2/2]
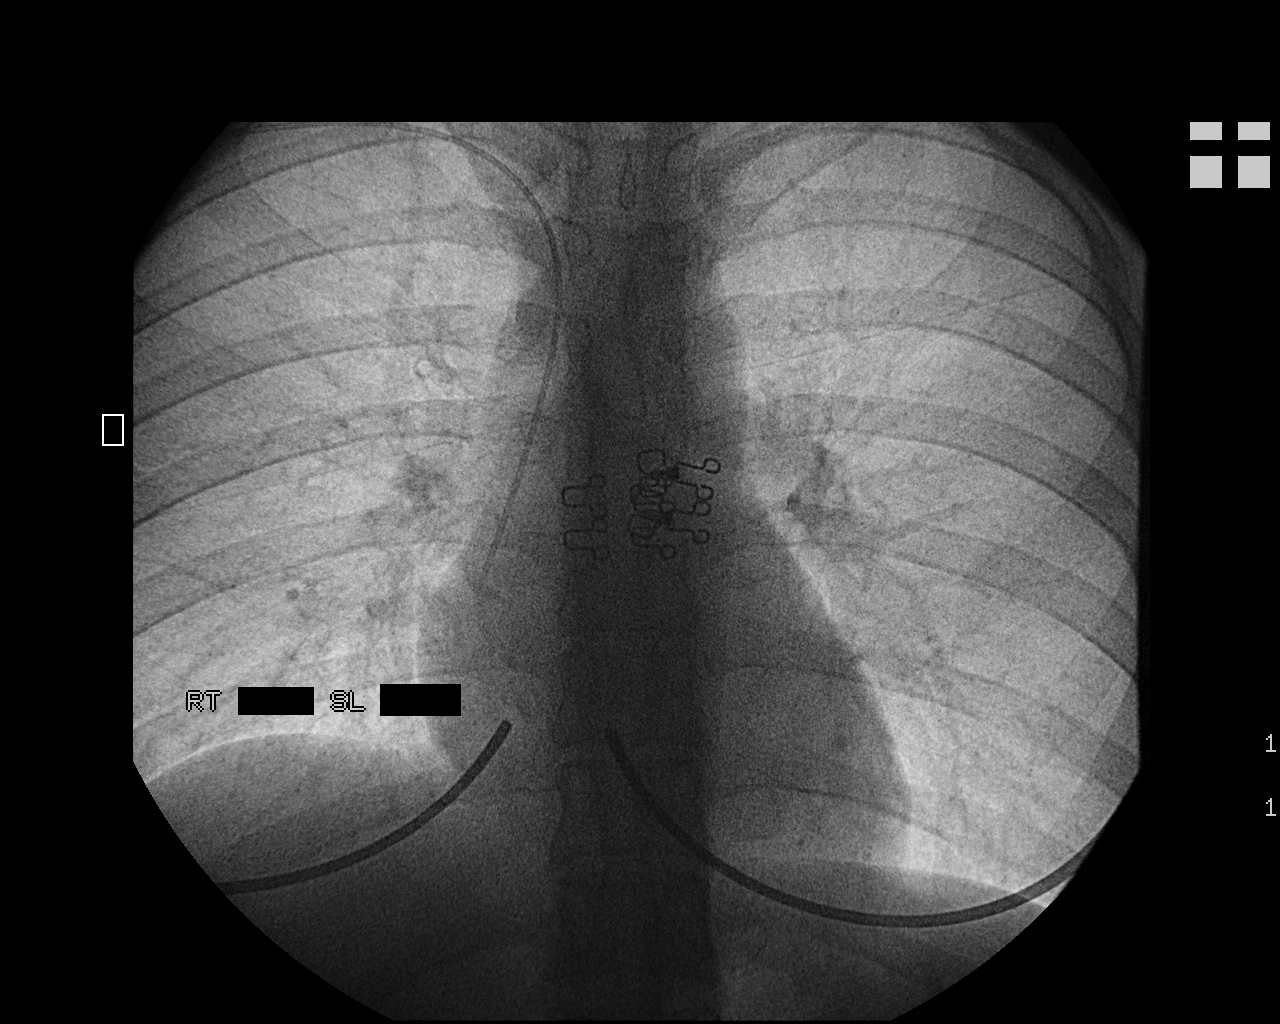

[2 of 2 positions shown; findings below may reference images not displayed]

EXAM:
POWER PICC LINE PLACEMENT WITH ULTRASOUND AND FLUOROSCOPIC GUIDANCE

FLUOROSCOPY TIME:  12 SECONDS

PROCEDURE:
The patient was advised of the possible risks andcomplications and
agreed to undergo the procedure. The patient was then brought to the
angiographic suite for the procedure.

The RIGHT arm was prepped with chlorhexidine, drapedin the usual
sterile fashion using maximum barrier technique (cap and mask,
sterile gown, sterile gloves, large sterile sheet, hand hygiene and
cutaneous antisepsis) and infiltrated locally with 1% Lidocaine.

Ultrasound demonstrated patency of the RIGHT BASILIC vein, and this
was documented with an image. Under real-time ultrasound guidance,
this vein was accessed with a 21 gauge micropuncture needle and
image documentation was performed. A [DATE] wire was introduced in to
the vein. Over this, a 5 French single lumen power PICC was advanced
to the lower SVC/right atrial junction. Fluoroscopy during the
procedure and fluoro spot radiograph confirms appropriate catheter
position. The catheter was flushed and covered with asterile
dressing.

Catheter length: 37

Complications: None immediate
IMPRESSION: Successful right arm power PICC line placement with ultrasound and
fluoroscopic guidance. The catheter is ready for use.

## 2018-01-13 ENCOUNTER — Other Ambulatory Visit: Payer: Self-pay

## 2018-01-13 ENCOUNTER — Emergency Department (HOSPITAL_COMMUNITY)
Admission: EM | Admit: 2018-01-13 | Discharge: 2018-01-13 | Disposition: A | Discharge status: Home / Self Care | Payer: Medicare Other | Attending: Emergency Medicine | Admitting: Emergency Medicine

## 2018-01-13 ENCOUNTER — Encounter (HOSPITAL_COMMUNITY): Payer: Self-pay | Admitting: Emergency Medicine

## 2018-01-13 DIAGNOSIS — N3001 Acute cystitis with hematuria: Secondary | ICD-10-CM | POA: Insufficient documentation

## 2018-01-13 DIAGNOSIS — R300 Dysuria: Secondary | ICD-10-CM | POA: Diagnosis present

## 2018-01-13 DIAGNOSIS — F17210 Nicotine dependence, cigarettes, uncomplicated: Secondary | ICD-10-CM | POA: Diagnosis not present

## 2018-01-13 LAB — CBC WITH DIFFERENTIAL/PLATELET
BASOS ABS: 0 10*3/uL (ref 0.0–0.1)
BASOS PCT: 0 %
Eosinophils Absolute: 0 10*3/uL (ref 0.0–0.7)
Eosinophils Relative: 1 %
HCT: 38.9 % (ref 36.0–46.0)
HEMOGLOBIN: 12.7 g/dL (ref 12.0–15.0)
LYMPHS PCT: 11 %
Lymphs Abs: 0.9 10*3/uL (ref 0.7–4.0)
MCH: 30.9 pg (ref 26.0–34.0)
MCHC: 32.6 g/dL (ref 30.0–36.0)
MCV: 94.6 fL (ref 78.0–100.0)
MONO ABS: 1.1 10*3/uL — AB (ref 0.1–1.0)
MONOS PCT: 13 %
NEUTROS ABS: 6.1 10*3/uL (ref 1.7–7.7)
NEUTROS PCT: 75 %
Platelets: 127 10*3/uL — ABNORMAL LOW (ref 150–400)
RBC: 4.11 MIL/uL (ref 3.87–5.11)
RDW: 13.5 % (ref 11.5–15.5)
WBC: 8.2 10*3/uL (ref 4.0–10.5)

## 2018-01-13 LAB — URINALYSIS, ROUTINE W REFLEX MICROSCOPIC
BACTERIA UA: NONE SEEN
Bilirubin Urine: NEGATIVE
Glucose, UA: NEGATIVE mg/dL
Hgb urine dipstick: NEGATIVE
KETONES UR: NEGATIVE mg/dL
Leukocytes, UA: NEGATIVE
Nitrite: POSITIVE — AB
Protein, ur: NEGATIVE mg/dL
Specific Gravity, Urine: 1.01 (ref 1.005–1.030)
pH: 5 (ref 5.0–8.0)

## 2018-01-13 LAB — BASIC METABOLIC PANEL
ANION GAP: 7 (ref 5–15)
BUN: 16 mg/dL (ref 6–20)
CALCIUM: 9.1 mg/dL (ref 8.9–10.3)
CHLORIDE: 103 mmol/L (ref 101–111)
CO2: 27 mmol/L (ref 22–32)
Creatinine, Ser: 0.64 mg/dL (ref 0.44–1.00)
GFR calc non Af Amer: >60 mL/min (ref >60)
GLUCOSE: 105 mg/dL — AB (ref 65–99)
Potassium: 3.6 mmol/L (ref 3.5–5.1)
Sodium: 137 mmol/L (ref 135–145)

## 2018-01-13 LAB — PREGNANCY, URINE: Preg Test, Ur: NEGATIVE

## 2018-01-13 MED ORDER — SULFAMETHOXAZOLE-TRIMETHOPRIM 800-160 MG PO TABS: 1.0000 | ORAL_TABLET | Freq: Once | ORAL | Status: DC

## 2018-01-13 MED ORDER — CIPROFLOXACIN IN D5W 400 MG/200ML IV SOLN
400.0000 mg | Freq: Once | INTRAVENOUS | Status: DC
  Filled 2018-01-13: qty 200

## 2018-01-13 MED ORDER — SODIUM CHLORIDE 0.9 % IV SOLN: 1.0000 g | Freq: Once | INTRAVENOUS | Status: DC

## 2018-01-13 MED ORDER — CEPHALEXIN 500 MG PO CAPS: 500.0000 mg | ORAL_CAPSULE | Freq: Four times a day (QID) | ORAL | 0 refills | Status: AC

## 2018-01-13 MED ORDER — CEFTRIAXONE SODIUM 1 G IJ SOLR
1.0000 g | Freq: Once | INTRAMUSCULAR | Status: AC
  Administered 2018-01-13: 1 g via INTRAMUSCULAR
  Filled 2018-01-13: qty 10

## 2018-01-13 MED ORDER — CEPHALEXIN 500 MG PO CAPS: 500.0000 mg | ORAL_CAPSULE | Freq: Four times a day (QID) | ORAL | 0 refills | Status: DC

## 2018-01-13 MED ORDER — LIDOCAINE HCL (PF) 1 % IJ SOLN
INTRAMUSCULAR | Status: AC
  Administered 2018-01-13: 17:00:00
  Filled 2018-01-13: qty 2

## 2018-01-13 MED ORDER — SULFAMETHOXAZOLE-TRIMETHOPRIM 800-160 MG PO TABS: 1.0000 | ORAL_TABLET | Freq: Two times a day (BID) | ORAL | 0 refills | Status: DC

## 2018-01-13 MED ORDER — KETOROLAC TROMETHAMINE 30 MG/ML IJ SOLN
30.0000 mg | Freq: Once | INTRAMUSCULAR | Status: AC
  Administered 2018-01-13: 30 mg via INTRAMUSCULAR
  Filled 2018-01-13: qty 1

## 2018-01-13 NOTE — ED Notes (Signed)
Fever at home per pt, 101 this morning per pt

## 2018-01-13 NOTE — ED Notes (Signed)
EDP made aware of multiple attempts for IV access.

## 2018-01-13 NOTE — ED Notes (Signed)
Multiple nurse have attempted for IV access without success.

## 2018-01-13 NOTE — Discharge Instructions (Addendum)
Take the entire course of your antibiotics, one more dose before bed tonight. Make sure you are drinking plenty of fluids.  Take motrin or tylenol for pain or fever and you may continue taking your azo for symptom relief.  Your kidney function is normal today.  However,  if you develop worsened symptoms as discussed, flank pain, vomiting, or any new symptoms, get rechecked here or with your primary doctor.

## 2018-01-13 NOTE — ED Triage Notes (Signed)
Pt c/o urinary freq with nausea. C/o fever, chills. Sx x 1 day

## 2018-01-13 NOTE — ED Notes (Signed)
Instructed pt to take all of antibiotics as prescribed. 

## 2018-01-15 LAB — URINE CULTURE
Culture: NO GROWTH
Special Requests: NORMAL

## 2018-01-15 NOTE — ED Provider Notes (Signed)
[facility] EMERGENCY DEPARTMENT Provider Note   CSN: [id number] Arrival date & time: 01/13/18  1207     History   Chief Complaint Chief Complaint  Patient presents with  . Urinary Frequency    HPI Catherine Ramirez is a 32 y.o. female with past medical history as outlined below presenting with a 1 day history of dysuria with increased urinary frequency with passage of small amounts of urine, nausea without emesis, and fever to 101 yesterday evening.  She denies back and flank pain. She does report frequent uti's with symptoms similar to todays presentation. She denies  Vaginal discharge, abdominal pain, dizziness, weakness.  She has taken azo and another otc medicine, name unknown for bladder infection without relief of sx..  The history is provided by the patient.    Past Medical History:  Diagnosis Date  . Arrhythmia   . 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 Chronic constipation   . Chronic pain syndrome   . Chronic pelvic pain in female   . IBS (irritable bowel syndrome)   . Osteomyelitis ([place])    bilateral hips    Patient Active Problem List   Diagnosis Date Noted  . Menorrhagia with regular cycle 10/21/2015  . Concern about STD in female without diagnosis 09/27/2015  . Intermittent fever of unknown origin 09/27/2015  . Sepsis ([place]) 06/13/2015  . Fever 06/13/2015  . SOB (shortness of breath) 06/13/2015  . Dizziness 06/13/2015  . Hip osteomyelitis, left ([place]) 06/13/2015  . 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 Bone abscess ([place]) 06/13/2015  . Chronic osteomyelitis, pelvis ([place])   . Iron deficiency anemia 04/23/2014  . Abscess 04/22/2014  . Acute diarrhea 11/26/2013  . Abdominal pain 11/18/2013  . Abdominal pain, acute 11/18/2013  . Cholelithiasis with acute cholecystitis 11/14/2013  . Chronic pain syndrome   . Cholelithiasis with cholecystitis 11/11/2013  . Cholecystitis with cholelithiasis 11/10/2013  . Postoperative state 11/02/2013  . Chronic  constipation 10/18/2013  . Post-op pain 10/14/2013  . 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 Unspecified constipation, chronic 10/14/2013  . Acute postoperative pain of abdomen 10/12/2013  . Nicotine addiction 10/10/2013  . History of pelvic fracture 10/10/2013  . OSTEOARTHRITIS 04/05/2008  . LOW BACK PAIN 04/05/2008    Past Surgical History:  Procedure Laterality Date  . BILATERAL SALPINGECTOMY Right 10/11/2013   Procedure: RIGHT SALPINGECTOMY;  Surgeon: [name], MD;  Location: [facility];  Service: Gynecology;  Laterality: Right;  . CESAREAN SECTION    . CHOLECYSTECTOMY N/A 11/10/2013   Procedure: LAPAROSCOPIC CHOLECYSTECTOMY;  Surgeon: Scherry Ran, MD;  Location: [facility];  Service: General;  Laterality: N/A;  . DIAGNOSTIC LAPAROSCOPY WITH REMOVAL OF ECTOPIC PREGNANCY N/A 10/15/2013   Procedure: DIAGNOSTIC LAPAROSCOPY ;  Surgeon: [name], MD;  Location: [facility];  Service: Gynecology;  Laterality: N/A;  . ERCP N/A 11/18/2013   Procedure: ENDOSCOPIC RETROGRADE CHOLANGIOPANCREATOGRAPHY (ERCP) WITH SMALL SPHINCTEROTOMY;  Surgeon: Rogene Houston, MD;  Location: [facility];  Service: Endoscopy;  Laterality: N/A;  . ERCP N/A 01/13/2014   Procedure: ENDOSCOPIC RETROGRADE CHOLANGIOPANCREATOGRAPHY (ERCP);  Surgeon: Rogene Houston, MD;  Location: [facility];  Service: Endoscopy;  Laterality: N/A;  . FRACTURE SURGERY    . LAPAROSCOPIC UNILATERAL SALPINGO OOPHERECTOMY Left 10/11/2013   Procedure:  LAPAROSCOPIC LEFT SALPINGO OOPHORECTOMY;  Surgeon: [name], MD;  Location: [facility];  Service: Gynecology;  Laterality: Left;  . OVARIAN CYST REMOVAL Left   . PELVIC FRACTURE SURGERY    .  SPHINCTEROTOMY N/A 01/13/2014   Procedure: SPHINCTEROTOMY;  Surgeon: Rogene Houston, MD;  Location: [facility];  Service: Endoscopy;  Laterality: N/A;  . STENT REMOVAL N/A 01/13/2014   Procedure: STENT REMOVAL;  Surgeon: Rogene Houston, MD;  Location: [facility];  Service: Endoscopy;  Laterality: N/A;  .  TONSILLECTOMY    . TUBAL LIGATION       OB History    Gravida      Para      Term      Preterm      AB      Living  2     SAB      TAB      Ectopic      Multiple      Live Births               Home Medications    Prior to Admission medications   Medication Sig Start Date End Date Taking? Authorizing Provider  acetaminophen (TYLENOL) 325 MG tablet Take 2 tablets (650 mg total) by mouth every 6 (six) hours as needed for mild pain (or Fever >/= 101). 06/13/15  Yes Rexene Alberts, MD  EPINEPHrine (EPIPEN) 0.3 mg/0.3 mL DEVI Inject 0.3 mg into the muscle once. Reported on 09/27/2015   Yes [provider]  ibuprofen (ADVIL,MOTRIN) 200 MG tablet Take 3 tablets (600 mg total) by mouth every 8 (eight) hours as needed for moderate pain. 06/13/15  Yes Rexene Alberts, MD  cephALEXin (KEFLEX) 500 MG capsule Take 1 capsule (500 mg total) by mouth 4 (four) times daily. 01/13/18   Evalee Jefferson, PA-C    Family History Family History  Problem Relation Age of Onset  . Hypertension Mother     Social History Social History   Tobacco Use  . Smoking status: Current Every Day Smoker    Packs/day: 0.00    Years: 2.00    Pack years: 0.00    Types: Cigarettes  . Smokeless tobacco: Never Used  . Tobacco comment: on a nicotine patch  Substance Use Topics  . Alcohol use: Yes    Comment: socially  . Drug use: No     Allergies   Amoxicillin; Bee venom; Shellfish allergy; Vancomycin hcl [vancomycin]; Ketamine; Gabapentin; Iodine; Other; and Penicillins   Review of Systems Review of Systems  Constitutional: Positive for fever.  HENT: Negative for congestion and sore throat.   Eyes: Negative.   Respiratory: Negative for chest tightness and shortness of breath.   Cardiovascular: Negative for chest pain.  Gastrointestinal: Negative for abdominal pain and nausea.  Genitourinary: Positive for dysuria, frequency, hematuria and urgency. Negative for flank pain, pelvic pain,  vaginal discharge and vaginal pain.  Musculoskeletal: Negative for arthralgias, joint swelling and neck pain.  Skin: Negative.  Negative for rash and wound.  Neurological: Negative for dizziness, weakness, light-headedness, numbness and headaches.  Psychiatric/Behavioral: Negative.      Physical Exam Updated Vital Signs BP 105/71   Pulse 70   Temp 98.1 F (36.7 C) (Oral)   Resp 18   Ht 5\' 5"  (1.651 m)   Wt 61.2 kg (135 lb)   LMP 10/23/2015   SpO2 96%   BMI 22.47 kg/m   Physical Exam  Constitutional: She appears well-developed and well-nourished.  HENT:  Head: Normocephalic and atraumatic.  Eyes: Conjunctivae are normal.  Neck: Normal range of motion.  Cardiovascular: Normal rate, regular rhythm, normal heart sounds and intact distal pulses.  Pulmonary/Chest: Effort normal and breath sounds normal. She has  no wheezes.  Abdominal: Soft. Bowel sounds are normal. She exhibits no mass. There is tenderness in the suprapubic area. There is no rigidity, no guarding and no CVA tenderness.  Musculoskeletal: Normal range of motion.  Neurological: She is alert.  Skin: Skin is warm and dry.  Psychiatric: She has a normal mood and affect.  Nursing note and vitals reviewed.    ED Treatments / Results  Labs (all labs ordered are listed, but only abnormal results are displayed) Labs Reviewed  URINALYSIS, ROUTINE W REFLEX MICROSCOPIC - Abnormal; Notable for the following components:      Result Value   Color, Urine RED (*)    Nitrite POSITIVE (*)    All other components within normal limits  CBC WITH DIFFERENTIAL/PLATELET - Abnormal; Notable for the following components:   Platelets 127 (*)    Monocytes Absolute 1.1 (*)    All other components within normal limits  BASIC METABOLIC PANEL - Abnormal; Notable for the following components:   Glucose, Bld 105 (*)    All other components within normal limits  URINE CULTURE  PREGNANCY, URINE    EKG None  Radiology No results  found.  Procedures Procedures (including critical care time)  Medications Ordered in ED Medications  cefTRIAXone (ROCEPHIN) injection 1 g (1 g Intramuscular Given 01/13/18 1627)  ketorolac (TORADOL) 30 MG/ML injection 30 mg (30 mg Intramuscular Given 01/13/18 1628)  lidocaine (PF) (XYLOCAINE) 1 % injection (  Given 01/13/18 1637)     Initial Impression / Assessment and Plan / ED Course  I have reviewed the triage vital signs and the nursing notes.  Pertinent labs & imaging results that were available during my care of the patient were reviewed by me and considered in my medical decision making (see chart for details).     Urinalysis nitrite positive, cx pending pt started on keflex, she was also given rocephin dose prior to dc home.  Pt and family concur she can take cephalosporins, despite her PCN allergy.  Given fever, she may have early pyelo, but labs today are reassuring.  No emesis, no need for admission at this time, but strict return precautions discussed.  Advised increased fluid intake, plan f/u with pcp and/or return here for any new or persistent sx.   Final Clinical Impressions(s) / ED Diagnoses   Final diagnoses:  Acute cystitis with hematuria    ED Discharge Orders        Ordered    sulfamethoxazole-trimethoprim (BACTRIM DS,SEPTRA DS) 800-160 MG tablet  2 times daily,   Status:  Discontinued     01/13/18 1555    cephALEXin (KEFLEX) 500 MG capsule  4 times daily,   Status:  Discontinued     01/13/18 1620    cephALEXin (KEFLEX) 500 MG capsule  4 times daily     01/13/18 1623       Evalee Jefferson, PA-C 01/15/18 1346    Hayden Rasmussen, MD 01/15/18 [phone]

## 2018-07-23 ENCOUNTER — Telehealth (HOSPITAL_COMMUNITY): Payer: Self-pay | Admitting: Psychiatry

## 2018-11-01 ENCOUNTER — Telehealth: Payer: Medicare Other | Admitting: Nurse Practitioner

## 2018-11-01 DIAGNOSIS — N898 Other specified noninflammatory disorders of vagina: Secondary | ICD-10-CM

## 2018-11-01 NOTE — Progress Notes (Signed)
We are sorry that you are not feeling well. Here is how we plan to help! * I contacted patient by phone because I was not sure what she was complaining about. She denies vaginal odor, no discharge and no itching.  Based on what you shared with me it looks like you: vaginal dryness. This can be caused by frequent intercourse, possible dehydration. I would recommend lubrication No bubble baths    Factors that increase your risk of developing vaginosis include: Marland Kitchen Medications, such as antibiotics and steroids . Uncontrolled diabetes . Use of hygiene products such as bubble bath, vaginal spray or vaginal deodorant . Douching . Wearing damp or tight-fitting clothing . Using an intrauterine device (IUD) for birth control . Hormonal changes, such as those associated with pregnancy, birth control pills or menopause . Sexual activity . Having a sexually transmitted infection   Be sure to take all of the medication as directed. Stop taking any medication if you develop a rash, tongue swelling or shortness of breath. Mothers who are breast feeding should consider pumping and discarding their breast milk while on these antibiotics. However, there is no consensus that infant exposure at these doses would be harmful.  Remember that medication creams can weaken latex condoms. Marland Kitchen   HOME CARE:  Good hygiene may prevent some types of vaginosis from recurring and may relieve some symptoms:  . Avoid baths, hot tubs and whirlpool spas. Rinse soap from your outer genital area after a shower, and dry the area well to prevent irritation. Don't use scented or harsh soaps, such as those with deodorant or antibacterial action. Mar[place] Kitchen Avoid irritants. These include scented tampons and pads. . Wipe from front to back after using the toilet. Doing so avoids spreading fecal bacteria to your vagina.  Other things that may help prevent vaginosis include:  Marland Kitchen Don't douche. Your vagina doesn't require cleansing other than  normal bathing. Repetitive douching disrupts the normal organisms that reside in the vagina and can actually increase your risk of vaginal infection. Douching won't clear up a vaginal infection. . Use a latex condom. Both female and female latex condoms may help you avoid infections spread by sexual contact. . Wear cotton underwear. Also wear pantyhose with a cotton crotch. If you feel comfortable without it, skip wearing underwear to bed. Yeast thrives in [employer] Your symptoms should improve in the next day or two.  GET HELP RIGHT AWAY IF:  . You have pain in your lower abdomen ( pelvic area or over your ovaries) . You develop nausea or vomiting . You develop a fever . Your discharge changes or worsens . You have persistent pain with intercourse . You develop shortness of breath, a rapid pulse, or you faint.  These symptoms could be signs of problems or infections that need to be evaluated by a medical provider now.  MAKE SURE YOU    Understand these instructions.  Will watch your condition.  Will get help right away if you are not doing well or get worse.  Your e-visit answers were reviewed by a board certified advanced clinical practitioner to complete your personal care plan. Depending upon the condition, your plan could have included both over the counter or prescription medications. Please review your pharmacy choice to make sure that you have choses a pharmacy that is open for you to pick up any needed prescription, Your safety is important to [place]. If you have drug allergies check your prescription carefully.   You can use MyChart  to ask questions about today's visit, request a non-urgent call back, or ask for a work or school excuse for 24 hours related to this e-Visit. If it has been greater than 24 hours you will need to follow up with your provider, or enter a new e-Visit to address those concerns. You will get a MyChart message within the next two days asking about  your experience. I hope that your e-visit has been valuable and will speed your recovery.  5 minutes spent reviewing and documenting in chart.

## 2018-11-02 ENCOUNTER — Other Ambulatory Visit: Payer: Self-pay | Admitting: Nurse Practitioner

## 2019-01-26 ENCOUNTER — Telehealth: Payer: Self-pay | Admitting: Obstetrics and Gynecology

## 2019-01-26 NOTE — Telephone Encounter (Signed)
Pt states that she may have a UTI and is wanting to see if she needs to come in or if medication can be sent in. She states that she has a smell, discharge, and bleeding.

## 2019-01-26 NOTE — Telephone Encounter (Signed)
Left message letting pt know she hasn't been seen here since 2017. Advised to reach out to PCP and see if they can see pt. [place]

## 2021-08-07 ENCOUNTER — Other Ambulatory Visit (HOSPITAL_COMMUNITY): Payer: Self-pay
